# Patient Record
Sex: Male | Born: 1955 | Race: Black or African American | Hispanic: No | Marital: Single | Smoking: Current every day smoker
Health system: Southern US, Community
[De-identification: ages and names within clinical notes are randomized; demographics above are authoritative.]

## PROBLEM LIST (undated history)

## (undated) DIAGNOSIS — I10 Essential (primary) hypertension: Secondary | ICD-10-CM

## (undated) DIAGNOSIS — T1490XA Injury, unspecified, initial encounter: Secondary | ICD-10-CM

## (undated) DIAGNOSIS — M549 Dorsalgia, unspecified: Secondary | ICD-10-CM

## (undated) DIAGNOSIS — K219 Gastro-esophageal reflux disease without esophagitis: Secondary | ICD-10-CM

## (undated) DIAGNOSIS — C159 Malignant neoplasm of esophagus, unspecified: Secondary | ICD-10-CM

## (undated) DIAGNOSIS — G5600 Carpal tunnel syndrome, unspecified upper limb: Secondary | ICD-10-CM

## (undated) HISTORY — DX: Injury, unspecified, initial encounter: T14.90XA

## (undated) HISTORY — PX: WRIST SURGERY: SHX841

---

## 1981-04-12 HISTORY — PX: BACK SURGERY: SHX140

## 1998-09-25 ENCOUNTER — Ambulatory Visit (HOSPITAL_COMMUNITY): Admission: RE | Admit: 1998-09-25 | Discharge: 1998-09-25 | Payer: Self-pay | Admitting: *Deleted

## 1998-11-24 ENCOUNTER — Emergency Department (HOSPITAL_COMMUNITY): Admission: EM | Admit: 1998-11-24 | Discharge: 1998-11-24 | Payer: Self-pay | Admitting: Emergency Medicine

## 1998-12-02 ENCOUNTER — Emergency Department (HOSPITAL_COMMUNITY): Admission: EM | Admit: 1998-12-02 | Discharge: 1998-12-02 | Payer: Self-pay | Admitting: Emergency Medicine

## 1999-06-02 ENCOUNTER — Emergency Department (HOSPITAL_COMMUNITY): Admission: EM | Admit: 1999-06-02 | Discharge: 1999-06-02 | Payer: Self-pay | Admitting: Emergency Medicine

## 2000-11-21 ENCOUNTER — Encounter: Payer: Self-pay | Admitting: Family Medicine

## 2000-11-21 ENCOUNTER — Ambulatory Visit (HOSPITAL_COMMUNITY): Admission: RE | Admit: 2000-11-21 | Discharge: 2000-11-21 | Payer: Self-pay | Admitting: Family Medicine

## 2004-08-22 ENCOUNTER — Emergency Department (HOSPITAL_COMMUNITY): Admission: EM | Admit: 2004-08-22 | Discharge: 2004-08-22 | Payer: Self-pay | Admitting: Emergency Medicine

## 2004-12-03 ENCOUNTER — Ambulatory Visit: Payer: Self-pay | Admitting: Family Medicine

## 2004-12-03 ENCOUNTER — Ambulatory Visit: Payer: Self-pay | Admitting: *Deleted

## 2004-12-14 ENCOUNTER — Ambulatory Visit (HOSPITAL_COMMUNITY): Admission: RE | Admit: 2004-12-14 | Discharge: 2004-12-14 | Payer: Self-pay | Admitting: Family Medicine

## 2004-12-25 ENCOUNTER — Ambulatory Visit: Payer: Self-pay | Admitting: Family Medicine

## 2006-03-08 ENCOUNTER — Ambulatory Visit: Payer: Self-pay | Admitting: Family Medicine

## 2006-04-01 ENCOUNTER — Ambulatory Visit (HOSPITAL_COMMUNITY): Admission: RE | Admit: 2006-04-01 | Discharge: 2006-04-01 | Payer: Self-pay | Admitting: Family Medicine

## 2006-04-11 ENCOUNTER — Ambulatory Visit: Payer: Self-pay | Admitting: Family Medicine

## 2006-12-28 ENCOUNTER — Encounter (INDEPENDENT_AMBULATORY_CARE_PROVIDER_SITE_OTHER): Payer: Self-pay | Admitting: *Deleted

## 2007-01-19 ENCOUNTER — Ambulatory Visit: Payer: Self-pay | Admitting: Internal Medicine

## 2007-06-22 ENCOUNTER — Ambulatory Visit: Payer: Self-pay | Admitting: Internal Medicine

## 2007-06-26 ENCOUNTER — Emergency Department (HOSPITAL_COMMUNITY): Admission: EM | Admit: 2007-06-26 | Discharge: 2007-06-26 | Payer: Self-pay | Admitting: Emergency Medicine

## 2007-06-27 ENCOUNTER — Ambulatory Visit: Payer: Self-pay | Admitting: Internal Medicine

## 2007-07-20 ENCOUNTER — Ambulatory Visit: Payer: Self-pay | Admitting: Internal Medicine

## 2008-05-21 ENCOUNTER — Ambulatory Visit: Payer: Self-pay | Admitting: Family Medicine

## 2009-08-16 ENCOUNTER — Emergency Department (HOSPITAL_COMMUNITY): Admission: EM | Admit: 2009-08-16 | Discharge: 2009-08-16 | Payer: Self-pay | Admitting: Emergency Medicine

## 2009-11-26 ENCOUNTER — Encounter (INDEPENDENT_AMBULATORY_CARE_PROVIDER_SITE_OTHER): Payer: Self-pay | Admitting: Family Medicine

## 2009-11-26 ENCOUNTER — Ambulatory Visit: Payer: Self-pay | Admitting: Family Medicine

## 2009-11-26 LAB — CONVERTED CEMR LAB
ALT: 15 units/L (ref 0–53)
AST: 22 units/L (ref 0–37)
Alkaline Phosphatase: 67 units/L (ref 39–117)
Basophils Absolute: 0 10*3/uL (ref 0.0–0.1)
CO2: 30 meq/L (ref 19–32)
Direct LDL: 112 mg/dL — ABNORMAL HIGH
Eosinophils Relative: 2 % (ref 0–5)
HCT: 47.1 % (ref 39.0–52.0)
Hemoglobin: 15.3 g/dL (ref 13.0–17.0)
Lymphocytes Relative: 30 % (ref 12–46)
Lymphs Abs: 1.7 10*3/uL (ref 0.7–4.0)
MCHC: 32.5 g/dL (ref 30.0–36.0)
MCV: 95.9 fL (ref 78.0–100.0)
Monocytes Relative: 7 % (ref 3–12)
Neutro Abs: 3.4 10*3/uL (ref 1.7–7.7)
Neutrophils Relative %: 60 % (ref 43–77)
RBC: 4.91 M/uL (ref 4.22–5.81)
Total Bilirubin: 0.5 mg/dL (ref 0.3–1.2)
Total Protein: 6.4 g/dL (ref 6.0–8.3)

## 2009-12-03 ENCOUNTER — Ambulatory Visit: Payer: Self-pay | Admitting: Cardiology

## 2009-12-03 ENCOUNTER — Ambulatory Visit (HOSPITAL_COMMUNITY): Admission: RE | Admit: 2009-12-03 | Discharge: 2009-12-03 | Payer: Self-pay | Admitting: Family Medicine

## 2009-12-03 ENCOUNTER — Encounter (INDEPENDENT_AMBULATORY_CARE_PROVIDER_SITE_OTHER): Payer: Self-pay | Admitting: Family Medicine

## 2009-12-09 ENCOUNTER — Encounter: Admission: RE | Admit: 2009-12-09 | Discharge: 2010-01-21 | Payer: Self-pay | Admitting: Family Medicine

## 2010-01-01 ENCOUNTER — Encounter (INDEPENDENT_AMBULATORY_CARE_PROVIDER_SITE_OTHER): Payer: Self-pay | Admitting: Family Medicine

## 2010-01-01 LAB — CONVERTED CEMR LAB: Microalb, Ur: 0.5 mg/dL (ref 0.00–1.89)

## 2010-02-19 ENCOUNTER — Emergency Department (HOSPITAL_COMMUNITY)
Admission: EM | Admit: 2010-02-19 | Discharge: 2010-02-19 | Payer: Self-pay | Source: Home / Self Care | Admitting: Emergency Medicine

## 2010-04-16 ENCOUNTER — Encounter (INDEPENDENT_AMBULATORY_CARE_PROVIDER_SITE_OTHER): Payer: Self-pay | Admitting: Family Medicine

## 2010-04-16 LAB — CONVERTED CEMR LAB
Calcium: 9.6 mg/dL (ref 8.4–10.5)
Chloride: 95 meq/L — ABNORMAL LOW (ref 96–112)
Creatinine, Ser: 1.11 mg/dL (ref 0.40–1.50)
Free T4: 1.53 ng/dL (ref 0.80–1.80)
Glucose, Bld: 114 mg/dL — ABNORMAL HIGH (ref 70–99)
Potassium: 4.4 meq/L (ref 3.5–5.3)

## 2010-06-23 LAB — URINALYSIS, ROUTINE W REFLEX MICROSCOPIC
Ketones, ur: NEGATIVE mg/dL
Urobilinogen, UA: 0.2 mg/dL (ref 0.0–1.0)
pH: 7 (ref 5.0–8.0)

## 2010-06-23 LAB — CBC
HCT: 41.9 % (ref 39.0–52.0)
MCHC: 34.1 g/dL (ref 30.0–36.0)
MCV: 90.7 fL (ref 78.0–100.0)
Platelets: 264 10*3/uL (ref 150–400)
RDW: 12.6 % (ref 11.5–15.5)

## 2010-06-23 LAB — DIFFERENTIAL
Basophils Absolute: 0 10*3/uL (ref 0.0–0.1)
Eosinophils Absolute: 0.1 10*3/uL (ref 0.0–0.7)
Eosinophils Relative: 1 % (ref 0–5)
Lymphocytes Relative: 12 % (ref 12–46)
Lymphs Abs: 1.2 10*3/uL (ref 0.7–4.0)
Neutro Abs: 8.3 10*3/uL — ABNORMAL HIGH (ref 1.7–7.7)
Neutrophils Relative %: 81 % — ABNORMAL HIGH (ref 43–77)

## 2010-06-23 LAB — URINE CULTURE: Culture  Setup Time: 201111102220

## 2010-06-23 LAB — COMPREHENSIVE METABOLIC PANEL
AST: 20 U/L (ref 0–37)
Albumin: 4 g/dL (ref 3.5–5.2)
CO2: 30 mEq/L (ref 19–32)
Creatinine, Ser: 1 mg/dL (ref 0.4–1.5)
GFR calc non Af Amer: >60 mL/min (ref >60)

## 2010-06-23 LAB — URINE MICROSCOPIC-ADD ON

## 2010-06-30 LAB — POCT I-STAT, CHEM 8
BUN: 8 mg/dL (ref 6–23)
Creatinine, Ser: 0.8 mg/dL (ref 0.4–1.5)
Glucose, Bld: 99 mg/dL (ref 70–99)
Hemoglobin: 16.3 g/dL (ref 13.0–17.0)
TCO2: 29 mmol/L (ref 0–100)

## 2010-06-30 LAB — LIPASE, BLOOD: Lipase: 33 U/L (ref 11–59)

## 2010-10-03 ENCOUNTER — Emergency Department (HOSPITAL_COMMUNITY): Payer: Self-pay

## 2010-10-03 ENCOUNTER — Emergency Department (HOSPITAL_COMMUNITY)
Admission: EM | Admit: 2010-10-03 | Discharge: 2010-10-03 | Disposition: A | Discharge status: Home / Self Care | Payer: Self-pay | Attending: Emergency Medicine | Admitting: Emergency Medicine

## 2010-10-03 DIAGNOSIS — S86819A Strain of other muscle(s) and tendon(s) at lower leg level, unspecified leg, initial encounter: Secondary | ICD-10-CM | POA: Insufficient documentation

## 2010-10-03 DIAGNOSIS — M25569 Pain in unspecified knee: Secondary | ICD-10-CM | POA: Insufficient documentation

## 2010-10-03 DIAGNOSIS — X500XXA Overexertion from strenuous movement or load, initial encounter: Secondary | ICD-10-CM | POA: Insufficient documentation

## 2010-10-03 DIAGNOSIS — I10 Essential (primary) hypertension: Secondary | ICD-10-CM | POA: Insufficient documentation

## 2010-10-03 DIAGNOSIS — S838X9A Sprain of other specified parts of unspecified knee, initial encounter: Secondary | ICD-10-CM | POA: Insufficient documentation

## 2010-12-15 ENCOUNTER — Other Ambulatory Visit (HOSPITAL_COMMUNITY): Payer: Self-pay | Admitting: Family Medicine

## 2010-12-15 DIAGNOSIS — M25562 Pain in left knee: Secondary | ICD-10-CM

## 2010-12-15 DIAGNOSIS — R531 Weakness: Secondary | ICD-10-CM

## 2010-12-15 DIAGNOSIS — R609 Edema, unspecified: Secondary | ICD-10-CM

## 2010-12-18 ENCOUNTER — Ambulatory Visit (HOSPITAL_COMMUNITY)
Admission: RE | Admit: 2010-12-18 | Discharge: 2010-12-18 | Disposition: A | Discharge status: Home / Self Care | Payer: Self-pay | Source: Ambulatory Visit | Attending: Family Medicine | Admitting: Family Medicine

## 2010-12-18 DIAGNOSIS — M76899 Other specified enthesopathies of unspecified lower limb, excluding foot: Secondary | ICD-10-CM | POA: Insufficient documentation

## 2010-12-18 DIAGNOSIS — R609 Edema, unspecified: Secondary | ICD-10-CM | POA: Insufficient documentation

## 2010-12-18 DIAGNOSIS — R531 Weakness: Secondary | ICD-10-CM

## 2010-12-18 DIAGNOSIS — M25569 Pain in unspecified knee: Secondary | ICD-10-CM | POA: Insufficient documentation

## 2010-12-18 DIAGNOSIS — M25562 Pain in left knee: Secondary | ICD-10-CM

## 2010-12-18 DIAGNOSIS — X58XXXA Exposure to other specified factors, initial encounter: Secondary | ICD-10-CM | POA: Insufficient documentation

## 2010-12-18 DIAGNOSIS — IMO0002 Reserved for concepts with insufficient information to code with codable children: Secondary | ICD-10-CM | POA: Insufficient documentation

## 2011-08-26 DIAGNOSIS — M7710 Lateral epicondylitis, unspecified elbow: Secondary | ICD-10-CM | POA: Insufficient documentation

## 2011-08-26 DIAGNOSIS — IMO0002 Reserved for concepts with insufficient information to code with codable children: Secondary | ICD-10-CM | POA: Insufficient documentation

## 2012-01-18 ENCOUNTER — Emergency Department (HOSPITAL_COMMUNITY)
Admission: EM | Admit: 2012-01-18 | Discharge: 2012-01-18 | Disposition: A | Discharge status: Home / Self Care | Payer: Self-pay | Attending: Emergency Medicine | Admitting: Emergency Medicine

## 2012-01-18 ENCOUNTER — Encounter (HOSPITAL_COMMUNITY): Payer: Self-pay

## 2012-01-18 DIAGNOSIS — F17200 Nicotine dependence, unspecified, uncomplicated: Secondary | ICD-10-CM | POA: Insufficient documentation

## 2012-01-18 DIAGNOSIS — I10 Essential (primary) hypertension: Secondary | ICD-10-CM | POA: Insufficient documentation

## 2012-01-18 DIAGNOSIS — Z760 Encounter for issue of repeat prescription: Secondary | ICD-10-CM | POA: Insufficient documentation

## 2012-01-18 HISTORY — DX: Essential (primary) hypertension: I10

## 2012-01-18 MED ORDER — METOPROLOL SUCCINATE ER 25 MG PO TB24: 50.0000 mg | ORAL_TABLET | Freq: Every day | ORAL | Status: DC

## 2012-01-18 MED ORDER — LISINOPRIL-HYDROCHLOROTHIAZIDE 20-12.5 MG PO TABS: 1.0000 | ORAL_TABLET | Freq: Every day | ORAL | Status: DC

## 2012-01-18 MED ORDER — HYDROCODONE-ACETAMINOPHEN 5-325 MG PO TABS: 1.0000 | ORAL_TABLET | Freq: Four times a day (QID) | ORAL | Status: DC | PRN

## 2012-01-18 NOTE — ED Notes (Signed)
Pt was patient of [facility] and needs rx of htn meds refilled, also needs meds for wrist and back pain.

## 2012-01-18 NOTE — ED Provider Notes (Cosign Needed)
History  Scribed for Aaron Lennert, MD, the patient was seen in room TR02C/TR02C. This chart was scribed by Candelaria Stagers. The patient's care started at 3:06 PM   CSN: [id number]  Arrival date & time 01/18/12  1231   First MD Initiated Contact with Patient 01/18/12 1504      Chief Complaint  Patient presents with  . Medication Refill     Patient is a 56 y.o. male presenting with hypertension. The history is provided by the patient. No language interpreter was used.  Hypertension This is a chronic problem. The current episode started more than 1 week ago. The problem occurs constantly. The problem has not changed since onset.Pertinent negatives include no chest pain, no abdominal pain and no headaches. Relieved by: HTN medication. Treatments tried: pt is currently out of prescribed medication.   Aaron Wang is a 56 y.o. male who presents to the Emergency Department complaining of need for medication refill including HTN medications and Vicodin.  He is a former pt of [facility].   Past Medical History  Diagnosis Date  . Hypertension     Past Surgical History  Procedure Date  . Back surgery   . Wrist surgery     No family history on file.  History  Substance Use Topics  . Smoking status: Light Tobacco Smoker  . Smokeless tobacco: Not on file  . Alcohol Use: No      Review of Systems  Constitutional: Negative for fatigue.  HENT: Negative for congestion, sinus pressure and ear discharge.   Eyes: Negative for discharge.  Respiratory: Negative for cough.   Cardiovascular: Negative for chest pain.  Gastrointestinal: Negative for abdominal pain and diarrhea.  Genitourinary: Negative for frequency and hematuria.  Musculoskeletal: Negative for back pain.  Skin: Negative for rash.  Neurological: Negative for seizures and headaches.  Hematological: Negative.   Psychiatric/Behavioral: Negative for hallucinations.    Allergies  Review of patient's allergies indicates  no known allergies.  Home Medications   Current Outpatient Rx  Name Route Sig Dispense Refill  . DULOXETINE HCL 60 MG PO CPEP Oral Take 60 mg by mouth daily.    Marland Kitchen LISINOPRIL-HYDROCHLOROTHIAZIDE 20-12.5 MG PO TABS Oral Take 2 tablets by mouth daily.    Marland Kitchen METOPROLOL SUCCINATE ER 50 MG PO TB24 Oral Take 50 mg by mouth daily. Take with or immediately following a meal.      BP 151/86  Pulse 95  Temp 98.6 F (37 C) (Oral)  Resp 16  SpO2 97%  Physical Exam  Constitutional: He is oriented to person, place, and time. He appears well-developed.  HENT:  Head: Normocephalic and atraumatic.  Eyes: Conjunctivae normal and EOM are normal. No scleral icterus.  Neck: Neck supple. No thyromegaly present.  Cardiovascular: Normal rate and regular rhythm.  Exam reveals no gallop and no friction rub.   No murmur heard. Pulmonary/Chest: No stridor. He has no wheezes. He has no rales. He exhibits no tenderness.  Abdominal: He exhibits no distension. There is no tenderness. There is no rebound.  Musculoskeletal: Normal range of motion. He exhibits no edema.  Lymphadenopathy:    He has no cervical adenopathy.  Neurological: He is oriented to person, place, and time. Coordination normal.  Skin: No rash noted. No erythema.  Psychiatric: He has a normal mood and affect. His behavior is normal.    ED Course  Procedures   DIAGNOSTIC STUDIES: Oxygen Saturation is 97% on room air, normal by my interpretation.    COORDINATION OF  CARE:     Labs Reviewed - No data to display No results found.   No diagnosis found.    MDM     The chart was scribed for me under my direct supervision.  I personally performed the history, physical, and medical decision making and all procedures in the evaluation of this patient.Aaron Lennert, MD 01/18/12 [phone]

## 2012-04-13 ENCOUNTER — Encounter (HOSPITAL_COMMUNITY): Payer: Self-pay | Admitting: *Deleted

## 2012-04-13 ENCOUNTER — Emergency Department (HOSPITAL_COMMUNITY)
Admission: EM | Admit: 2012-04-13 | Discharge: 2012-04-13 | Disposition: A | Discharge status: Home / Self Care | Payer: Self-pay | Attending: Emergency Medicine | Admitting: Emergency Medicine

## 2012-04-13 DIAGNOSIS — I10 Essential (primary) hypertension: Secondary | ICD-10-CM | POA: Insufficient documentation

## 2012-04-13 DIAGNOSIS — Z760 Encounter for issue of repeat prescription: Secondary | ICD-10-CM | POA: Insufficient documentation

## 2012-04-13 DIAGNOSIS — F17200 Nicotine dependence, unspecified, uncomplicated: Secondary | ICD-10-CM | POA: Insufficient documentation

## 2012-04-13 DIAGNOSIS — Z79899 Other long term (current) drug therapy: Secondary | ICD-10-CM | POA: Insufficient documentation

## 2012-04-13 MED ORDER — METOPROLOL SUCCINATE ER 25 MG PO TB24: 25.0000 mg | ORAL_TABLET | Freq: Every day | ORAL | Status: DC

## 2012-04-13 MED ORDER — LISINOPRIL-HYDROCHLOROTHIAZIDE 20-12.5 MG PO TABS: 1.0000 | ORAL_TABLET | Freq: Every day | ORAL | Status: DC

## 2012-04-13 NOTE — ED Provider Notes (Signed)
History     CSN: [id number]  Arrival date & time 04/13/12  4098   First MD Initiated Contact with Patient 04/13/12 [phone]      Chief Complaint  Patient presents with  . Medication Refill    (Consider location/radiation/quality/duration/timing/severity/associated sxs/prior treatment) HPI  57 year old male with history of hypertension presents requesting for medication refill. Patient reports he has been out of his blood pressure medication for a month. He was last added refill in the ER. He mentioned that he is trying to find a primary care Dr. and is in the process. He denies any acute changes, specifically no headache, vision changes, chest pain or shortness of breath. He does complain of generalized aches and pains his back, shoulders and arms which has been chronic in nature.   Past Medical History  Diagnosis Date  . Hypertension     Past Surgical History  Procedure Date  . Back surgery   . Wrist surgery     No family history on file.  History  Substance Use Topics  . Smoking status: Light Tobacco Smoker  . Smokeless tobacco: Not on file  . Alcohol Use: No      Review of Systems  Constitutional: Negative for fever.       A complete 10 system review of systems was obtained and all systems are negative except as noted in the HPI and PMH.  HENT: Negative for neck pain.   Respiratory: Negative for shortness of breath.   Cardiovascular: Negative for chest pain.  Gastrointestinal: Negative for abdominal pain.  Genitourinary: Negative for hematuria.  Musculoskeletal: Positive for back pain.  Neurological: Negative for numbness.    Allergies  Review of patient's allergies indicates no known allergies.  Home Medications   Current Outpatient Rx  Name  Route  Sig  Dispense  Refill  . DULOXETINE HCL 60 MG PO CPEP   Oral   Take 60 mg by mouth daily.         Marland Kitchen HYDROCODONE-ACETAMINOPHEN 5-325 MG PO TABS   Oral   Take 1 tablet by mouth every 6 (six) hours as needed  for pain.   30 tablet   1   . LISINOPRIL-HYDROCHLOROTHIAZIDE 20-12.5 MG PO TABS   Oral   Take 2 tablets by mouth daily.         Marland Kitchen LISINOPRIL-HYDROCHLOROTHIAZIDE 20-12.5 MG PO TABS   Oral   Take 1 tablet by mouth daily.   30 tablet   1   . METOPROLOL SUCCINATE ER 25 MG PO TB24   Oral   Take 2 tablets (50 mg total) by mouth daily.   30 tablet   1   . METOPROLOL SUCCINATE ER 50 MG PO TB24   Oral   Take 50 mg by mouth daily. Take with or immediately following a meal.           BP 177/93  Temp 97.5 F (36.4 C) (Oral)  Resp 16  SpO2 99%  Physical Exam  Nursing note and vitals reviewed. Constitutional: He appears well-developed and well-nourished. No distress.       Awake, alert, nontoxic appearance  HENT:  Head: Atraumatic.  Eyes: Conjunctivae normal are normal. Right eye exhibits no discharge. Left eye exhibits no discharge.  Neck: Normal range of motion. Neck supple.  Cardiovascular: Normal rate and regular rhythm.   Pulmonary/Chest: Effort normal. No respiratory distress. He exhibits no tenderness.  Abdominal: Soft. There is no tenderness. There is no rebound.  Musculoskeletal: He exhibits no edema and  no tenderness.       ROM appears intact, no obvious focal weakness  Neurological: He is alert.  Skin: Skin is warm and dry. No rash noted.  Psychiatric: He has a normal mood and affect.    ED Course  Procedures (including critical care time)  Labs Reviewed - No data to display No results found.   No diagnosis found.  1. Encounter for medication refill.   MDM  Pt is here requesting for medication refill for his HTN.  Pt takes lisinopril-HCTZ, and motoprolol.  Will give prescription refill.  Pt encourage to continue with finding PCP.  To return if he is out of meds.  Pt otherwise in NAD.  Examination unremarkable.    BP 177/93  Temp 97.5 F (36.4 C) (Oral)  Resp 16  SpO2 99%       Fayrene Helper, PA-C 04/13/12 1610

## 2012-04-13 NOTE — ED Provider Notes (Signed)
Medical screening examination/treatment/procedure(s) were performed by non-physician practitioner and as supervising physician I was immediately available for consultation/collaboration.    Nelia Shi, MD 04/13/12 [phone]

## 2012-04-13 NOTE — ED Notes (Signed)
Pt reports out of BP medications x 1 month. Given Lisinopril-HCTZ and Metoprolol by Dr. Estell Harpin in October. Trying to get a PCP.

## 2012-04-19 ENCOUNTER — Ambulatory Visit: Payer: Self-pay | Admitting: Family Medicine

## 2013-02-09 ENCOUNTER — Ambulatory Visit (INDEPENDENT_AMBULATORY_CARE_PROVIDER_SITE_OTHER): Payer: No Typology Code available for payment source | Admitting: Family Medicine

## 2013-02-09 ENCOUNTER — Encounter: Payer: Self-pay | Admitting: Family Medicine

## 2013-02-09 VITALS — BP 150/83 | Ht 67.5 in | Wt 144.0 lb

## 2013-02-09 DIAGNOSIS — M25569 Pain in unspecified knee: Secondary | ICD-10-CM

## 2013-02-09 DIAGNOSIS — M549 Dorsalgia, unspecified: Secondary | ICD-10-CM | POA: Insufficient documentation

## 2013-02-09 DIAGNOSIS — G5601 Carpal tunnel syndrome, right upper limb: Secondary | ICD-10-CM

## 2013-02-09 DIAGNOSIS — M25562 Pain in left knee: Secondary | ICD-10-CM

## 2013-02-09 DIAGNOSIS — G5600 Carpal tunnel syndrome, unspecified upper limb: Secondary | ICD-10-CM | POA: Insufficient documentation

## 2013-02-09 MED ORDER — MELOXICAM 15 MG PO TABS: 15.0000 mg | ORAL_TABLET | Freq: Every day | ORAL | Status: DC

## 2013-02-09 NOTE — Assessment & Plan Note (Signed)
Mobic prescription given today.

## 2013-02-09 NOTE — Assessment & Plan Note (Signed)
Encouraged patient to wear night splint

## 2013-02-09 NOTE — Patient Instructions (Addendum)
Take the Mobic once a day for pain relief.    If you would like to consider an injection in your knee feel free to return, if the pain medication is not working.  Follow up in 4-6 weeks.  You should wear your night splint for the carpal tunnel syndrome in your wrist.

## 2013-02-09 NOTE — Assessment & Plan Note (Signed)
Patient offered injection today however he declined. Started on Mobic. Compression knee brace given today.

## 2013-02-09 NOTE — Progress Notes (Signed)
Patient ID: Aaron Wang, male   DOB: [birth year], 57 y.o.   MRN: [id number] 57 year old male with 3 complaints.  #1 left knee pain. Onset over the past several years. History of a twisting injury to his knee with an MRI 2 years ago revealing posterior medial meniscal tear. Reports pain at night as well as when walking. Occasional catching or locking of the knee. Pain worse with stooping squatting on stairs. No recent injury. Knee occasionally swells. Has seen an orthopedist who recommended surgery however, he has not elected to do that at this time.  #2 low back pain. 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 No radiation of symptoms no weakness or numbness not currently taking any medications. Does not recall a specific inciting event describes worsening low back pain. This has been a chronic issue plaguing him for greater than 7-8 years. Gradually worsening. Pain when standing up or heavy lifting.  #3 right carpal tunnel syndrome. Patient has had progressively worsening symptoms of right carpal tunnel syndrome. Seen in orthopedist in the past but not had surgery. He wears night splints occasionally. Not currently working with his hands. No new injury. Denies dropping objects at this time.  Past Medical History  Diagnosis Date  . Hypertension    Past Surgical History  Procedure Laterality Date  . Back surgery    . Wrist surgery     No Known Allergies  Review of systems as per history of present illness otherwise negative  Examination: BP 150/83  Ht 5' 7.5" (1.715 m)  Wt 144 lb (65.318 kg)  BMI 22.21 kg/m2 Well-developed well-nourished 57 year old male awake alert and oriented in no acute distress  Low back: No tenderness to palpation. Flexion and extension intact. Negative straight leg raise. Or vascular intact distally.  Right wrist: Positive Tinel's and Phalen's sign. No evidence of thenar atrophy at this time. Strength 5/5. Pulses intact.  Left Knee: Normal to  inspection with no erythema or effusion or obvious bony abnormalities. Palpation with medial joint line tenderness ROM full in flexion and extension and lower leg rotation. Ligaments with solid consistent endpoints including ACL, PCL, LCL, MCL. Positive Mcmurray's tests medial side to pain. Non painful patellar compression. Patellar glide without crepitus. Patellar and quadriceps tendons unremarkable. Hamstring and quadriceps strength is normal.

## 2013-02-09 NOTE — Addendum Note (Signed)
Addended by: Annita Brod on: 02/09/2013 02:49 PM   Modules accepted: Orders

## 2013-03-21 ENCOUNTER — Ambulatory Visit: Payer: No Typology Code available for payment source | Admitting: Emergency Medicine

## 2013-05-09 ENCOUNTER — Ambulatory Visit (INDEPENDENT_AMBULATORY_CARE_PROVIDER_SITE_OTHER): Payer: Self-pay | Admitting: Emergency Medicine

## 2013-05-09 ENCOUNTER — Encounter: Payer: Self-pay | Admitting: Emergency Medicine

## 2013-05-09 VITALS — BP 161/89

## 2013-05-09 DIAGNOSIS — M25569 Pain in unspecified knee: Secondary | ICD-10-CM

## 2013-05-09 MED ORDER — METHYLPREDNISOLONE ACETATE 80 MG/ML IJ SUSP
80.0000 mg | Freq: Once | INTRAMUSCULAR | Status: AC
Start: 2013-05-09 — End: 2013-05-09
  Administered 2013-05-09: 80 mg via INTRA_ARTICULAR

## 2013-05-09 NOTE — Progress Notes (Signed)
Patient ID: Aaron Wang, male   DOB: October 26, 1955, 58 y.o.   MRN: [id number] 58 year old male with several years of left knee pain. He continues to be bothersome. Diagnosed with a posterior medial meniscal tear on MRI 2 years ago. Has been wearing a splint with moderate relief of symptoms. Was seen the sports medicine clinic 3 months ago at that time declined injection therapy. Presents today with continued symptoms requesting injection therapy.  Review of systems as per history of present illness otherwise negative  Pertinent past medical history hypertension and left posterior medial meniscal tear.  Social history:  Late tobacco smoker no alcohol  Examination Well-developed well-nourished 58 year old African American male awake alert and oriented no acute distress Left Knee: Normal to inspection with no erythema or effusion or obvious bony abnormalities. Palpation with joint line tenderness medially, no patellar tenderness, or condyle tenderness. ROM full in flexion and extension and lower leg rotation. Ligaments with solid consistent endpoints including ACL, PCL, LCL, MCL. Negative Mcmurray's Non painful patellar compression. Patellar glide without crepitus. Patellar and quadriceps tendons unremarkable. Hamstring and quadriceps strength is normal.   Neurovascularly intact bilateral lower extremities  Procedure:  Injection of left knee Consent obtained and verified. Time-out conducted. Noted no overlying erythema, induration, or other signs of local infection. Skin prepped in a sterile fashion. Topical analgesic spray: Ethyl chloride. Completed without difficulty. Meds: 4cc 1% lido with 1 cc 80mg /ml depomedrol Pain immediately improved suggesting accurate placement of the medication. Advised to call if fevers/chills, erythema, induration, drainage, or persistent bleeding.

## 2013-08-30 ENCOUNTER — Encounter: Payer: Self-pay | Admitting: Gastroenterology

## 2013-10-10 ENCOUNTER — Encounter: Payer: Self-pay | Admitting: Gastroenterology

## 2013-10-15 ENCOUNTER — Telehealth: Payer: Self-pay | Admitting: Gastroenterology

## 2013-10-15 NOTE — Telephone Encounter (Signed)
Received 1 page from [facility], sent to Dr. Ardis Hughs. 10/15/13/ss.

## 2013-10-15 NOTE — Telephone Encounter (Signed)
Received 2 pages from [facility], sent to Dr. Ardis Hughs. 10/15/13/ss

## 2013-11-22 ENCOUNTER — Encounter (HOSPITAL_COMMUNITY): Payer: Self-pay | Admitting: Emergency Medicine

## 2013-11-22 ENCOUNTER — Emergency Department (HOSPITAL_COMMUNITY)
Admission: EM | Admit: 2013-11-22 | Discharge: 2013-11-22 | Disposition: A | Discharge status: Home / Self Care | Payer: Medicaid Other | Attending: Emergency Medicine | Admitting: Emergency Medicine

## 2013-11-22 ENCOUNTER — Emergency Department (HOSPITAL_COMMUNITY): Payer: Medicaid Other

## 2013-11-22 DIAGNOSIS — Y9389 Activity, other specified: Secondary | ICD-10-CM | POA: Diagnosis not present

## 2013-11-22 DIAGNOSIS — Z79899 Other long term (current) drug therapy: Secondary | ICD-10-CM | POA: Insufficient documentation

## 2013-11-22 DIAGNOSIS — Y92009 Unspecified place in unspecified non-institutional (private) residence as the place of occurrence of the external cause: Secondary | ICD-10-CM | POA: Diagnosis not present

## 2013-11-22 DIAGNOSIS — I10 Essential (primary) hypertension: Secondary | ICD-10-CM | POA: Insufficient documentation

## 2013-11-22 DIAGNOSIS — Z791 Long term (current) use of non-steroidal anti-inflammatories (NSAID): Secondary | ICD-10-CM | POA: Insufficient documentation

## 2013-11-22 DIAGNOSIS — F17200 Nicotine dependence, unspecified, uncomplicated: Secondary | ICD-10-CM | POA: Diagnosis not present

## 2013-11-22 DIAGNOSIS — S8990XA Unspecified injury of unspecified lower leg, initial encounter: Secondary | ICD-10-CM | POA: Insufficient documentation

## 2013-11-22 DIAGNOSIS — S99929A Unspecified injury of unspecified foot, initial encounter: Principal | ICD-10-CM

## 2013-11-22 DIAGNOSIS — S99919A Unspecified injury of unspecified ankle, initial encounter: Principal | ICD-10-CM

## 2013-11-22 DIAGNOSIS — W010XXA Fall on same level from slipping, tripping and stumbling without subsequent striking against object, initial encounter: Secondary | ICD-10-CM | POA: Diagnosis not present

## 2013-11-22 DIAGNOSIS — M25561 Pain in right knee: Secondary | ICD-10-CM

## 2013-11-22 MED ORDER — OXYCODONE-ACETAMINOPHEN 5-325 MG PO TABS: 1.0000 | ORAL_TABLET | ORAL | Status: DC | PRN

## 2013-11-22 NOTE — ED Notes (Signed)
Patient states he tripped over something and "think I might have hit to the side of my knee".   Patient complains of R knee pain.

## 2013-11-22 NOTE — ED Provider Notes (Signed)
CSN: [id number]     Arrival date & time 11/22/13  6945 History   First MD Initiated Contact with Patient 11/22/13 [phone]     Chief Complaint  Patient presents with  . Knee Pain     (Consider location/radiation/quality/duration/timing/severity/associated sxs/prior Treatment) Patient is a 58 y.o. male presenting with knee pain. The history is provided by the patient and medical records.  Knee Pain  This is a 58 year old male with past medical history significant for hypertension,  Presenting to the ED for right knee injury. Patient st when he tripped and fell on the concrete in front of his home.  States when he feel his knee twisted.  He denies head injury or LOC.  States since fall he has been having persistent right knee worse, worse along medial joint line.  States he is able to ambulate, but is doing so with a limp.  Denies numbness, paresthesias, or weakness of right leg.  Patient does have hx of ligament tears in his left knee, denies prior right knee injuries.  Patient is established at sports medicine clinic.  Past Medical History  Diagnosis Date  . Hypertension    Past Surgical History  Procedure Laterality Date  . Back surgery    . Wrist surgery     No family history on file. History  Substance Use Topics  . Smoking status: Light Tobacco Smoker  . Smokeless tobacco: Not on file  . Alcohol Use: No    Review of Systems  Musculoskeletal: Positive for arthralgias.  All other systems reviewed and are negative.     Allergies  Review of patient's allergies indicates no known allergies.  Home Medications   Prior to Admission medications   Medication Sig Start Date End Date Taking? Authorizing Provider  DULoxetine (CYMBALTA) 60 MG capsule Take 60 mg by mouth daily.    Historical Provider, MD  HYDROcodone-acetaminophen (NORCO/VICODIN) 5-325 MG per tablet Take 1 tablet by mouth every 6 (six) hours as needed for pain. 01/18/12   Maudry Diego, MD   lisinopril-hydrochlorothiazide (PRINZIDE) 20-12.5 MG per tablet Take 1 tablet by mouth daily. 04/13/12   Domenic Moras, PA-C  meloxicam (MOBIC) 15 MG tablet Take 1 tablet (15 mg total) by mouth daily. 02/09/13   Hennie Duos, MD  metoprolol succinate (TOPROL-XL) 25 MG 24 hr tablet Take 1 tablet (25 mg total) by mouth daily. 04/13/12   Domenic Moras, PA-C   BP 166/79  Pulse 78  Temp(Src) 97.9 F (36.6 C) (Oral)  Resp 18  Ht 5\' 7"  (1.702 m)  Wt 143 lb (64.864 kg)  BMI 22.39 kg/m2  SpO2 99%  Physical Exam  Nursing note and vitals reviewed. Constitutional: He is oriented to person, place, and time. He appears well-developed and well-nourished. No distress.  HENT:  Head: Normocephalic and atraumatic.  Mouth/Throat: Oropharynx is clear and moist.  Eyes: Conjunctivae and EOM are normal. Pupils are equal, round, and reactive to light.  Neck: Normal range of motion. Neck supple.  Cardiovascular: Normal rate, regular rhythm and normal heart sounds.   Pulmonary/Chest: Effort normal and breath sounds normal. No respiratory distress. He has no wheezes.  Musculoskeletal: Normal range of motion.       Right knee: Tenderness found. Medial joint line tenderness noted.  Left knee in brace Right knee with tenderness along medial joint line; full flexion/extension maintained; some pain with varus stress; normal McMurray's test; no tendon laxity noted; leg NVI  Neurological: He is alert and oriented to person, place,  and time.  Skin: Skin is warm and dry. He is not diaphoretic.  Psychiatric: He has a normal mood and affect.    ED Course  Procedures (including critical care time) Labs Review Labs Reviewed - No data to display  Imaging Review Dg Knee Complete 4 Views Right  11/22/2013   CLINICAL DATA:  Fall, twisting knee injury  EXAM: RIGHT KNEE - COMPLETE 4+ VIEW  COMPARISON:  None.  FINDINGS: There is no evidence of fracture, dislocation, or joint effusion. There is no evidence of arthropathy or other  focal bone abnormality. Soft tissues are unremarkable.  IMPRESSION: No acute osseous finding   Electronically Signed   By: Daryll Brod M.D.   On: 11/22/2013 09:07     EKG Interpretation None      MDM   Final diagnoses:  Right knee pain   Imaging negative for acute findings.  Based on exam findings, suggest ligamentous strain-- low suspicion for complete ligamentous/tendon tear. Leg remains NVI.  Patient placed in knee sleeve, percocet for pain.  Encouraged RICE routine and activity modification.  Will FU with sports medicine clinic.  Discussed plan with patient, he/she acknowledged understanding and agreed with plan of care.  Return precautions given for new or worsening symptoms.  Larene Pickett, PA-C 11/22/13 1014

## 2013-11-22 NOTE — Discharge Instructions (Signed)
Take the prescribed medication as directed. Follow-up with your orthopedist. Return to the ED for new or worsening symptoms.

## 2013-11-23 NOTE — ED Provider Notes (Signed)
Medical screening examination/treatment/procedure(s) were performed by non-physician practitioner and as supervising physician I was immediately available for consultation/collaboration.  Ephraim Hamburger, MD 11/23/13 1520

## 2014-04-17 ENCOUNTER — Encounter: Payer: Self-pay | Admitting: Gastroenterology

## 2014-04-24 ENCOUNTER — Telehealth: Payer: Self-pay | Admitting: Gastroenterology

## 2014-04-24 NOTE — Telephone Encounter (Signed)
Rec'd records from [facility] 2pgs to Dr.Jacobs

## 2014-06-03 ENCOUNTER — Encounter: Payer: Self-pay | Admitting: Gastroenterology

## 2014-06-17 ENCOUNTER — Encounter: Payer: Medicaid Other | Admitting: Gastroenterology

## 2014-07-24 ENCOUNTER — Encounter: Payer: Self-pay | Admitting: Gastroenterology

## 2014-08-06 ENCOUNTER — Encounter: Payer: Medicaid Other | Admitting: Gastroenterology

## 2014-09-18 ENCOUNTER — Ambulatory Visit (AMBULATORY_SURGERY_CENTER): Payer: Self-pay

## 2014-09-18 VITALS — Ht 67.0 in | Wt 129.6 lb

## 2014-09-18 DIAGNOSIS — Z1211 Encounter for screening for malignant neoplasm of colon: Secondary | ICD-10-CM

## 2014-09-18 MED ORDER — MOVIPREP 100 G PO SOLR: 1.0000 | Freq: Once | ORAL | Status: DC

## 2014-09-18 NOTE — Progress Notes (Signed)
No allergies to eggs or soy No diet/weight loss meds No home oxygen No past problems with anesthesia  No email no computer

## 2014-10-02 ENCOUNTER — Encounter: Payer: Medicaid Other | Admitting: Gastroenterology

## 2014-10-02 ENCOUNTER — Telehealth: Payer: Self-pay | Admitting: Gastroenterology

## 2014-10-02 NOTE — Telephone Encounter (Signed)
Should bill him the no show, late cancel fee unless he reschedules

## 2015-03-27 ENCOUNTER — Encounter (HOSPITAL_COMMUNITY): Payer: Self-pay | Admitting: Emergency Medicine

## 2015-03-27 ENCOUNTER — Emergency Department (HOSPITAL_COMMUNITY): Payer: Medicaid Other

## 2015-03-27 ENCOUNTER — Emergency Department (HOSPITAL_COMMUNITY)
Admission: EM | Admit: 2015-03-27 | Discharge: 2015-03-27 | Disposition: A | Discharge status: Home / Self Care | Payer: Medicaid Other | Attending: Emergency Medicine | Admitting: Emergency Medicine

## 2015-03-27 DIAGNOSIS — Y9389 Activity, other specified: Secondary | ICD-10-CM | POA: Diagnosis not present

## 2015-03-27 DIAGNOSIS — Y92410 Unspecified street and highway as the place of occurrence of the external cause: Secondary | ICD-10-CM | POA: Diagnosis not present

## 2015-03-27 DIAGNOSIS — Z79899 Other long term (current) drug therapy: Secondary | ICD-10-CM | POA: Diagnosis not present

## 2015-03-27 DIAGNOSIS — I10 Essential (primary) hypertension: Secondary | ICD-10-CM | POA: Insufficient documentation

## 2015-03-27 DIAGNOSIS — F17200 Nicotine dependence, unspecified, uncomplicated: Secondary | ICD-10-CM | POA: Insufficient documentation

## 2015-03-27 DIAGNOSIS — Y998 Other external cause status: Secondary | ICD-10-CM | POA: Insufficient documentation

## 2015-03-27 DIAGNOSIS — M25522 Pain in left elbow: Secondary | ICD-10-CM

## 2015-03-27 DIAGNOSIS — S59902A Unspecified injury of left elbow, initial encounter: Secondary | ICD-10-CM | POA: Diagnosis not present

## 2015-03-27 MED ORDER — IBUPROFEN 800 MG PO TABS: 800.0000 mg | ORAL_TABLET | Freq: Three times a day (TID) | ORAL | Status: AC

## 2015-03-27 MED ORDER — IBUPROFEN 400 MG PO TABS
800.0000 mg | ORAL_TABLET | Freq: Once | ORAL | Status: AC
Start: 2015-03-27 — End: 2015-03-27
  Administered 2015-03-27: 800 mg via ORAL
  Filled 2015-03-27: qty 2

## 2015-03-27 NOTE — ED Notes (Signed)
Ring and watch removed and ice pack applied to left elbow
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
## 2015-03-27 NOTE — ED Notes (Signed)
Pt able to ambulate independently 
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
## 2015-03-27 NOTE — Discharge Instructions (Signed)

## 2015-10-16 ENCOUNTER — Ambulatory Visit (INDEPENDENT_AMBULATORY_CARE_PROVIDER_SITE_OTHER): Payer: Medicaid Other | Admitting: Sports Medicine

## 2015-10-16 ENCOUNTER — Encounter: Payer: Self-pay | Admitting: Sports Medicine

## 2015-10-16 VITALS — BP 163/90 | HR 68 | Ht 66.0 in | Wt 134.0 lb

## 2015-10-16 DIAGNOSIS — M25562 Pain in left knee: Secondary | ICD-10-CM | POA: Diagnosis not present

## 2015-10-16 MED ORDER — METHYLPREDNISOLONE ACETATE 80 MG/ML IJ SUSP
80.0000 mg | Freq: Once | INTRAMUSCULAR | Status: AC
  Administered 2015-10-16: 80 mg via INTRA_ARTICULAR

## 2015-10-17 NOTE — Progress Notes (Signed)
   Subjective:    Patient ID: Aaron Wang, male    DOB: [birth year], 58 y.o.   MRN: [id number]  HPI chief complaint: Left knee pain  Very pleasant 60 year old male comes in today complaining of returning left knee pain. He has a well-documented history of a medial meniscal tear in this knee which happened after an injury in 2012. Surgery was recommended but the patient decided against this. Instead, he has been treated conservatively with cortisone injections which seem to help. Last injection was in 2015 and provided him with good symptom relief up until about 3 months ago. His pain has now returned. It is primarily along the medial knee. It is present mainly with activity but he does get some pain at rest as well. No mechanical symptoms. Pain is sharp in quality and does occasionally cause the knee to buckle. He would like to proceed with a repeat cortisone injection. He was also given a double upright brace during that 2015 visit and he would like a new brace as it does seem to help him.  Interim medical history is reviewed Current medications reviewed Allergies reviewed    Review of Systems    as above Objective:   Physical Exam  Well-developed, well-nourished. No acute distress. Vital signs reviewed  Left knee: Range of motion is 0-100. No obvious effusion. He is tender to palpation along the medial joint line with a positive McMurray's and a positive Thessaly's. No tenderness to palpation along the lateral joint line. Some mild laxity with anterior drawer testing. Posterior drawer is intact. Knee is stable to valgus and varus stressing. Neurovascularly intact distally. Walking with the assistance of a cane.  MRI scan from 2012 is reviewed. Patient has a tear of the posterior horn of the medial meniscus. Otherwise, he has only some mild degenerative changes throughout the knee. Previous MCL injury is also noted which has healed in the interim per my exam.      Assessment & Plan:    Returning left knee pain secondary to medial meniscal tear  Patient has had excellent success with cortisone injections in the past. Therefore, I've agreed to reinject his knee today. I will repeat the injection and the patient may increase activity as tolerated. We will fit him with a new double upright brace to wear with activity. If symptoms do not improve with today's repeat injection then we may need to reconsider surgical referral for meniscectomy. Follow-up for ongoing or recalcitrant issues.  Consent obtained and verified. Time-out conducted. Noted no overlying erythema, induration, or other signs of local infection. Skin prepped in a sterile fashion. Topical analgesic spray: Ethyl chloride. Joint: left knee Needle: 25g 1.5 inch Completed without difficulty. Meds: 3cc 1% xylocaine and 1cc (80mg ) depomedrol  Advised to call if fevers/chills, erythema, induration, drainage, or persistent bleeding.

## 2017-01-21 ENCOUNTER — Emergency Department (HOSPITAL_COMMUNITY)
Admission: EM | Admit: 2017-01-21 | Discharge: 2017-01-21 | Disposition: A | Discharge status: Home / Self Care | Payer: Medicaid Other | Attending: Emergency Medicine | Admitting: Emergency Medicine

## 2017-01-21 ENCOUNTER — Encounter (HOSPITAL_COMMUNITY): Payer: Self-pay | Admitting: Emergency Medicine

## 2017-01-21 DIAGNOSIS — H1131 Conjunctival hemorrhage, right eye: Secondary | ICD-10-CM | POA: Insufficient documentation

## 2017-01-21 DIAGNOSIS — Z79899 Other long term (current) drug therapy: Secondary | ICD-10-CM | POA: Diagnosis not present

## 2017-01-21 DIAGNOSIS — I10 Essential (primary) hypertension: Secondary | ICD-10-CM | POA: Diagnosis not present

## 2017-01-21 DIAGNOSIS — F17210 Nicotine dependence, cigarettes, uncomplicated: Secondary | ICD-10-CM | POA: Diagnosis not present

## 2017-01-21 DIAGNOSIS — Z7982 Long term (current) use of aspirin: Secondary | ICD-10-CM | POA: Diagnosis not present

## 2017-01-21 DIAGNOSIS — H5711 Ocular pain, right eye: Secondary | ICD-10-CM | POA: Diagnosis present

## 2017-01-21 MED ORDER — TETRACAINE HCL 0.5 % OP SOLN
2.0000 [drp] | Freq: Once | OPHTHALMIC | Status: DC
  Filled 2017-01-21: qty 4

## 2017-01-21 MED ORDER — FLUORESCEIN SODIUM 1 MG OP STRP
1.0000 | ORAL_STRIP | Freq: Once | OPHTHALMIC | Status: DC
Start: 2017-01-21 — End: 2017-01-21
  Filled 2017-01-21: qty 1

## 2017-01-21 NOTE — Discharge Instructions (Signed)
Please read instructions below. Follow-up with your primary care doctor or eye doctor if symptoms persist. Return to the ER immediately if you have sudden vision loss, ear pain in your right eye, or new or concerning symptoms.

## 2017-01-21 NOTE — ED Triage Notes (Signed)
Pt reports photosensitivity and pain in R eye, onset Tuesday.  Denies injury. Conjuctiva noted to be red.
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
## 2018-01-04 ENCOUNTER — Encounter (HOSPITAL_COMMUNITY): Payer: Self-pay | Admitting: Emergency Medicine

## 2018-01-04 ENCOUNTER — Emergency Department (HOSPITAL_COMMUNITY): Payer: Medicaid Other

## 2018-01-04 ENCOUNTER — Emergency Department (HOSPITAL_COMMUNITY)
Admission: EM | Admit: 2018-01-04 | Discharge: 2018-01-04 | Disposition: A | Discharge status: Home / Self Care | Payer: Medicaid Other | Attending: Emergency Medicine | Admitting: Emergency Medicine

## 2018-01-04 DIAGNOSIS — Z79899 Other long term (current) drug therapy: Secondary | ICD-10-CM | POA: Insufficient documentation

## 2018-01-04 DIAGNOSIS — F17210 Nicotine dependence, cigarettes, uncomplicated: Secondary | ICD-10-CM | POA: Diagnosis not present

## 2018-01-04 DIAGNOSIS — I10 Essential (primary) hypertension: Secondary | ICD-10-CM | POA: Insufficient documentation

## 2018-01-04 DIAGNOSIS — J029 Acute pharyngitis, unspecified: Secondary | ICD-10-CM | POA: Diagnosis present

## 2018-01-04 DIAGNOSIS — K219 Gastro-esophageal reflux disease without esophagitis: Secondary | ICD-10-CM

## 2018-01-04 LAB — CBC WITH DIFFERENTIAL/PLATELET
Abs Immature Granulocytes: 0 10*3/uL (ref 0.0–0.1)
BASOS ABS: 0 10*3/uL (ref 0.0–0.1)
BASOS PCT: 1 %
EOS ABS: 0.1 10*3/uL (ref 0.0–0.7)
Eosinophils Relative: 2 %
HEMATOCRIT: 44.2 % (ref 39.0–52.0)
Hemoglobin: 14.5 g/dL (ref 13.0–17.0)
IMMATURE GRANULOCYTES: 0 %
LYMPHS ABS: 1.1 10*3/uL (ref 0.7–4.0)
Lymphocytes Relative: 33 %
MCH: 31.5 pg (ref 26.0–34.0)
MCHC: 32.8 g/dL (ref 30.0–36.0)
MCV: 96.1 fL (ref 78.0–100.0)
Monocytes Absolute: 0.4 10*3/uL (ref 0.1–1.0)
Monocytes Relative: 13 %
NEUTROS PCT: 51 %
Neutro Abs: 1.8 10*3/uL (ref 1.7–7.7)
PLATELETS: 293 10*3/uL (ref 150–400)
RBC: 4.6 MIL/uL (ref 4.22–5.81)
RDW: 12.7 % (ref 11.5–15.5)
WBC: 3.4 10*3/uL — AB (ref 4.0–10.5)

## 2018-01-04 LAB — COMPREHENSIVE METABOLIC PANEL
ALBUMIN: 3.9 g/dL (ref 3.5–5.0)
ALT: 13 U/L (ref 0–44)
ANION GAP: 10 (ref 5–15)
AST: 18 U/L (ref 15–41)
Alkaline Phosphatase: 67 U/L (ref 38–126)
BUN: 7 mg/dL (ref 6–20)
CHLORIDE: 101 mmol/L (ref 98–111)
CO2: 27 mmol/L (ref 22–32)
Calcium: 9.6 mg/dL (ref 8.9–10.3)
Creatinine, Ser: 0.83 mg/dL (ref 0.61–1.24)
GFR calc Af Amer: >60 mL/min (ref >60)
GFR calc non Af Amer: >60 mL/min (ref >60)
GLUCOSE: 96 mg/dL (ref 70–99)
POTASSIUM: 3.8 mmol/L (ref 3.5–5.1)
SODIUM: 138 mmol/L (ref 135–145)
Total Bilirubin: 0.6 mg/dL (ref 0.3–1.2)
Total Protein: 6.4 g/dL — ABNORMAL LOW (ref 6.5–8.1)

## 2018-01-04 LAB — URINALYSIS, ROUTINE W REFLEX MICROSCOPIC
Bilirubin Urine: NEGATIVE
GLUCOSE, UA: NEGATIVE mg/dL
Hgb urine dipstick: NEGATIVE
Ketones, ur: NEGATIVE mg/dL
LEUKOCYTES UA: NEGATIVE
Nitrite: NEGATIVE
PH: 5 (ref 5.0–8.0)
Protein, ur: NEGATIVE mg/dL
SPECIFIC GRAVITY, URINE: 1.019 (ref 1.005–1.030)

## 2018-01-04 LAB — LIPASE, BLOOD: Lipase: 57 U/L — ABNORMAL HIGH (ref 11–51)

## 2018-01-04 LAB — I-STAT TROPONIN, ED: Troponin i, poc: 0 ng/mL (ref 0.00–0.08)

## 2018-01-04 MED ORDER — GI COCKTAIL ~~LOC~~
30.0000 mL | Freq: Once | ORAL | Status: AC
  Administered 2018-01-04: 30 mL via ORAL
  Filled 2018-01-04: qty 30

## 2018-01-04 NOTE — ED Provider Notes (Signed)
Patient placed in Quick Look pathway, seen and evaluated   Chief Complaint: pain with swalling  HPI:   Garrison Michie is a 62 y.o. male with a history of hypertension, presents to the emergency department for evaluation of 2 to 3 months of sore throat and pain with swallowing.  He reports he has pain in his chest and abdomen when he tries to eat food or water.  He reports he went to see his primary care doctor who did some x-rays and did not find anything, he reports he was started on a medication but is unsure which one and this did not seem to help his symptoms.  He has not followed up with anyone else regarding this.  He denies any associated nausea or vomiting.  No fevers or chills, no blood in his stool.  He reports chest pain is present primarily in the middle and when he is eating or after eating he has eaten.  He denies any associated shortness of breath.  ROS:+ Pain with swallowing, chest pain, abdominal pain. -Fevers, chills, cough, shortness of breath, diarrhea, melena or hematochezia  Physical Exam:   Gen: No distress  Neuro: Awake and Alert  Skin: Warm    Focused Exam: Heart with regular rate and rhythm and lungs clear to auscultation, there is mild tenderness across the middle of the chest and there is epigastric tenderness with mild guarding.   Initiation of care has begun. The patient has been counseled on the process, plan, and necessity for staying for the completion/evaluation, and the remainder of the medical screening examination    Janet Berlin 01/04/18 Rosebud, Ankit, MD 01/06/18 1535

## 2018-01-04 NOTE — ED Provider Notes (Signed)
[facility] EMERGENCY DEPARTMENT Provider Note   CSN: [id number] Arrival date & time: 01/04/18  1435     History   Chief Complaint Chief Complaint  Patient presents with  . Sore Throat    HPI Aaron Wang is a 61 y.o. male with history of GERD presenting for 3 months of pain with swallowing.  Patient describes his pain as a burning that radiates from the top of his chest down to his stomach.  Patient states that pain is constant and moderate in intensity for the last 4 months however it worsened with food.  Patient states that he has been seen by his primary care provider and prescribed omeprazole.  Patient states that he has been taking this as prescribed for the last 2 weeks without significant relief of symptoms.  Patient states that he is also begun using Zantac over the past week with little relief.  Patient denies fever, nausea/vomiting, diarrhea, shortness of breath, dyspnea on exertion.  HPI  Past Medical History:  Diagnosis Date  . Hypertension   . Injury    tore ligaments left knee    Patient Active Problem List   Diagnosis Date Noted  . Knee meniscus pain 02/09/2013  . Back pain 02/09/2013  . Carpal tunnel syndrome 02/09/2013    Past Surgical History:  Procedure Laterality Date  . BACK SURGERY    . KNEE SURGERY    . WRIST SURGERY     carpal tunnel        Home Medications    Prior to Admission medications   Medication Sig Start Date End Date Taking? Authorizing Provider  acetaminophen (TYLENOL) 500 MG tablet Take 500 mg by mouth daily.   Yes [provider]  ibuprofen (ADVIL,MOTRIN) 800 MG tablet Take 1 tablet (800 mg total) by mouth 3 (three) times daily. Patient taking differently: Take 800 mg by mouth every 8 (eight) hours as needed for mild pain.  03/27/15  Yes Browning, Robert, PA-C  lisinopril-hydrochlorothiazide (PRINZIDE) 20-12.5 MG per tablet Take 1 tablet by mouth daily. 04/13/12  Yes Tran, Bowie, PA-C  metoprolol  succinate (TOPROL-XL) 25 MG 24 hr tablet Take 1 tablet (25 mg total) by mouth daily. 04/13/12  Yes Tran, Bowie, PA-C  omeprazole (PRILOSEC) 20 MG capsule Take 20 mg by mouth 2 (two) times daily before a meal.   Yes [provider]  RA ASPIRIN EC ADULT LOW ST 81 MG EC tablet Take 81 mg by mouth daily.  10/11/15  Yes [provider]  ranitidine (ZANTAC) 150 MG tablet Take 150 mg by mouth 2 (two) times daily as needed for heartburn.   Yes [provider]  MOVIPREP 100 G SOLR Take 1 kit (200 g total) by mouth once. Patient not taking: Reported on 01/04/2018 09/18/14   Jacobs, Daniel P, MD    Family History Family History  Problem Relation Age of Onset  . Colon cancer Neg Hx     Social History Social History   Tobacco Use  . Smoking status: Current Every Day Smoker    Packs/day: 0.50  . Smokeless tobacco: Never Used  Substance Use Topics  . Alcohol use: Yes    Alcohol/week: 0.0 standard drinks    Comment: occasional; weekends  . Drug use: No     Allergies   Patient has no known allergies.   Review of Systems Review of Systems  Constitutional: Negative.  Negative for chills and fever.  HENT: Negative.  Negative for rhinorrhea and sore throat.     Eyes: Negative.  Negative for visual disturbance.  Respiratory: Negative.  Negative for cough and shortness of breath.   Cardiovascular: Positive for chest pain. Negative for palpitations and leg swelling.  Gastrointestinal: Positive for abdominal pain. Negative for blood in stool, diarrhea, nausea and vomiting.  Genitourinary: Negative.  Negative for dysuria and hematuria.  Musculoskeletal: Negative.  Negative for arthralgias and myalgias.  Skin: Negative.  Negative for rash.  Neurological: Negative.  Negative for dizziness, weakness and headaches.     Physical Exam Updated Vital Signs BP (!) 144/78   Pulse 62   Temp 98.2 F (36.8 C) (Oral)   Resp 16   SpO2 100%   Physical Exam  Constitutional: He is  oriented to person, place, and time. He appears well-developed and well-nourished. He does not appear ill. No distress.  HENT:  Head: Normocephalic and atraumatic.  Right Ear: External ear normal.  Left Ear: External ear normal.  Nose: Nose normal.  Mouth/Throat: Uvula is midline, oropharynx is clear and moist and mucous membranes are normal. No uvula swelling. No posterior oropharyngeal edema, posterior oropharyngeal erythema or tonsillar abscesses.  Eyes: Pupils are equal, round, and reactive to light. EOM are normal.  Neck: Trachea normal and normal range of motion. Neck supple. No tracheal deviation present.  Cardiovascular: Normal rate, regular rhythm, normal heart sounds and intact distal pulses.  Pulses:      Dorsalis pedis pulses are 2+ on the right side, and 2+ on the left side.       Posterior tibial pulses are 2+ on the right side, and 2+ on the left side.  Pulmonary/Chest: Effort normal and breath sounds normal. No respiratory distress. He exhibits no tenderness, no crepitus, no edema, no deformity and no swelling.  Abdominal: Soft. Bowel sounds are normal. There is tenderness in the epigastric area. There is no rigidity, no rebound, no guarding, no CVA tenderness, no tenderness at McBurney's point and negative Murphy's sign.  Patient with mild epigastric tenderness to palpation.  Musculoskeletal: Normal range of motion.  Feet:  Right Foot:  Protective Sensation: 3 sites tested. 3 sites sensed.  Left Foot:  Protective Sensation: 3 sites tested. 3 sites sensed.  Neurological: He is alert and oriented to person, place, and time. He has normal strength. GCS eye subscore is 4. GCS verbal subscore is 5. GCS motor subscore is 6.  Speech is clear and goal oriented, follows commands Major Cranial nerves without deficit, no facial droop Normal strength in upper and lower extremities bilaterally including dorsiflexion and plantar flexion, strong and equal grip strength Moves extremities  without ataxia, coordination intact Normal gait  Skin: Skin is warm and dry. Capillary refill takes less than 2 seconds.  Psychiatric: He has a normal mood and affect. His behavior is normal.   ED Treatments / Results  Labs (all labs ordered are listed, but only abnormal results are displayed) Labs Reviewed  CBC WITH DIFFERENTIAL/PLATELET - Abnormal; Notable for the following components:      Result Value   WBC 3.4 (*)    All other components within normal limits  COMPREHENSIVE METABOLIC PANEL - Abnormal; Notable for the following components:   Total Protein 6.4 (*)    All other components within normal limits  LIPASE, BLOOD - Abnormal; Notable for the following components:   Lipase 57 (*)    All other components within normal limits  URINALYSIS, ROUTINE W REFLEX MICROSCOPIC  I-STAT TROPONIN, ED    EKG None  Radiology Dg Chest 2 View  Result Date: 01/04/2018 CLINICAL DATA:  Worsening chest pain for 4 months. Shortness of breath. Smoker. EXAM: CHEST - 2 VIEW COMPARISON:  Chest radiograph Aug 16, 2009 FINDINGS: Cardiomediastinal silhouette is normal. Mild calcific atherosclerosis. Mild interstitial prominence. No pleural effusions or focal consolidations. Hyperinflation. Trachea projects midline and there is no pneumothorax. Soft tissue planes and included osseous structures are non-suspicious. IMPRESSION: Mild interstitial prominence and hyperinflation seen with COPD. No focal consolidation. Aortic Atherosclerosis (ICD10-I70.0). Electronically Signed   By: Courtnay  Bloomer M.D.   On: 01/04/2018 15:39    Procedures Procedures (including critical care time)  Medications Ordered in ED Medications  gi cocktail (Maalox,Lidocaine,Donnatal) (30 mLs Oral Given 01/04/18 1721)     Initial Impression / Assessment and Plan / ED Course  I have reviewed the triage vital signs and the nursing notes.  Pertinent labs & imaging results that were available during my care of the patient were  reviewed by me and considered in my medical decision making (see chart for details).  Clinical Course as of Jan 05 1920  Wed Jan 04, 2018  1804 Patient endorses complete resolution of pain with GI cocktail.   [BM]    Clinical Course User Index [BM] Morelli, Brandon A, PA-C   61-year-old male presented for 3 months of GERD-like symptoms.  Symptoms relieved today with single GI cocktail.  Patient endorsing complete relief of pain.  Troponin negative Urinalysis within normal limits CMP nonacute CBC nonacute Lipase of 57, patient given GI follow-up. Chest x-ray without acute findings EKG without acute abnormalities  Patient is to be discharged with recommendation to follow up with PCP in regards to today's hospital visit. Chest pain is not likely of cardiac or pulmonary etiology d/t presentation, VSS, no tracheal deviation, no JVD or new murmur, RRR, breath sounds equal bilaterally, EKG without acute abnormalities, negative troponin, and negative CXR. Pt has been advised to continue PPI and return to the ED is CP becomes exertional, associated with diaphoresis or nausea, radiates to left jaw/arm, worsens or becomes concerning in any way. Pt appears reliable for follow up and is agreeable to discharge.   Patient has been given gastroenterology follow-up.  At this time there does not appear to be any evidence of an acute emergency medical condition and the patient appears stable for discharge with appropriate outpatient follow up. Diagnosis was discussed with patient who verbalizes understanding of care plan and is agreeable to discharge. I have discussed return precautions with patient who verbalizes understanding of return precautions. Patient strongly encouraged to follow-up with their PCP. All questions answered.  Patient's case discussed with Dr. Wentz who agrees with plan to discharge with follow-up.     Note: Portions of this report may have been transcribed using voice recognition  software. Every effort was made to ensure accuracy; however, inadvertent computerized transcription errors may still be present.  Final Clinical Impressions(s) / ED Diagnoses   Final diagnoses:  Gastroesophageal reflux disease, esophagitis presence not specified    ED Discharge Orders    None       Morelli, Brandon A, PA-C 01/04/18 2335    Wentz, Elliott, MD 01/06/18 0011  

## 2018-01-04 NOTE — ED Triage Notes (Signed)
Pt presents to ED for assessment for throat, chest and abdominal pain x 2 months.  Seen by PCP, sent to "specialist".  Patient states xrays of his lungs taken.  States whenever he swallows food or water he has pain in his throat and chest.  Denies n/v/d.  C/o intermittent SOB, and pain with deep inspiration.

## 2018-01-04 NOTE — Discharge Instructions (Signed)
Please return to the Emergency Department for any new or worsening symptoms or if your symptoms do not improve. Please be sure to follow up with your Primary Care Physician as soon as possible regarding your visit today. If you do not have a Primary Doctor please use the resources below to establish one. Please call the gastroenterologist office for follow-up as soon as possible regarding your pain today. Please continue to take the omeprazole and Zantac as prescribed by your primary care provider to help with your pain.  Contact a health care provider if: Your chest pain does not go away. You have a rash with blisters on your chest. You have a fever. You have chills. Get help right away if: Your chest pain is worse. You have a cough that gets worse, or you cough up blood. You have severe pain in your abdomen. You have severe weakness. You faint. You have sudden, unexplained chest discomfort. You have sudden, unexplained discomfort in your arms, back, neck, or jaw. You have shortness of breath at any time. You suddenly start to sweat, or your skin gets clammy. You feel nauseous or you vomit. You suddenly feel light-headed or dizzy. Your heart begins to beat quickly, or it feels like it is skipping beats. Contact a health care provider if: You have new symptoms. You have unexplained weight loss. You have difficulty swallowing, or it hurts to swallow. You have wheezing or a persistent cough. Your symptoms do not improve with treatment. You have a hoarse voice. Get help right away if: You have pain in your arms, neck, jaw, teeth, or back. You feel sweaty, dizzy, or light-headed. You have chest pain or shortness of breath. You vomit and your vomit looks like blood or coffee grounds. You faint. Your stool is bloody or black. You cannot swallow, drink, or eat.  Do not take your medicine if  develop an itchy rash, swelling in your mouth or lips, or difficulty breathing.   RESOURCE  GUIDE  Chronic Pain Problems: Contact [facility]  [phone] Patients need to be referred by their primary care doctor.  Insufficient Money for Medicine: Contact United Way:  call "211" or Page [phone].  No Primary Care Doctor: Call Health Connect  [phone] - can help you locate a primary care doctor that  accepts your insurance, provides certain services, etc. Physician Referral [phone]  Agencies that provide inexpensive medical care: [facility]  [facility]  [phone] [facility]  [phone] [facility]  [phone] [facility]  [phone] [facility]  [phone]  [place] Providers: [facility]- [address]  [phone], Mon-Fri 9am-7pm, Sat 9am-1pm [place], [address] [place]  [place], [address] [place]  [place]- [address]  [place], [address], [phone]  Only accepts [place] Access [place] patients after they have their name  applied to their card  Self Pay (no insurance) in [facility]: Sickle Cell Patients: Dr Kevan Ny, [facility]  [place], [facility]- [place]  [facility] [place]- [place], [facility]- see information above (Speak to Raymond if you do not have insurance)       -  [facility]- [place], [place],  [place], [place]  Dr Vista Lawman-  [address], [place], [facility]- [address] [phone]       -  [facility]  [place]- [address], [place], also [address], [phone]       -    [facility]- [address], [place], 1st & 3rd Saturday   every month, 10am-1pm  1) Find a Doctor and Pay Out of Pocket Although you won't have to find out who is covered by your insurance plan, it is a good idea to ask around and get recommendations. You will then need to call the office and see if the doctor you have chosen will accept you as a new patient and what types of options they offer for patients who are self-pay. Some doctors offer discounts or will set up payment plans for their patients who do not have insurance, but you will need to ask so you aren't surprised when you get to your appointment.  2) Contact Your Local Health Department Not all health departments have doctors that can see patients for sick visits, but many do, so it is worth a call to see if yours does. If you don't know where your local health department is, you can check in your phone book. The CDC also has a tool to help you locate your state's health department, and many state websites also have listings of all of their local health departments.  3) Find a [facility] If your illness is not likely to be very severe or complicated, you may want to try a walk in clinic. These are popping up all over the country in pharmacies, drugstores, and shopping centers. They're usually staffed by nurse practitioners or physician assistants that have been trained to treat common illnesses and complaints. They're usually fairly quick and inexpensive. However, if you have serious medical issues or chronic medical problems, these are probably not your best option  [facility] [place], [facility], [address], [place], phone [phone] or [phone].  Monday - Friday, call for an appointment. [place], [facility], [address], [place], phone [phone] or [phone].  Monday - Friday, call for an appointment.  Abuse/Neglect: [place] [phone] [place] [phone] (After Hours)  Emergency Shelter:  [facility] [phone]  Maternity Homes: Room at the [place] [phone] [place] [phone]  MRSA Hotline #:   (585)301-4740  [facility] Dept. [address]         [place]  Sela Hua Phone:  [phone]                                  Phone:  [phone]                   Phone:  [place], [place]- [phone]       -     [facility] in [place], [address]                                  [phone], [place] [phone] or [phone] (After Hours)   [place]  Substance Abuse Resources: Alcohol and Drug Services  [phone] [place] [phone] [facility] [place] Chinita Pester [phone] Residential & Outpatient Substance Abuse Program  [phone]  Psychological Services: [place]  [phone] [place]  [place], [place]. [address] [place], [place]: [phone] or [phone], PicCapture.uy  Dental Assistance  If unable to pay or uninsured, contact:  [facility] or [facility]. to become qualified for the adult dental clinic.  Patients with Medicaid: [facility] [phone] [address]  [place], [place] [address] [phone]  If unable to pay, or uninsured, contact [facility] [phone]) or [place] [phone] in [place], [place] in [facility]) to become qualified for the adult dental clinic   Other [place]- [place], [place], [place], [address], [place], [place], 2nd and 4th Thursday of the month at 6:30am.  10 clients each day by appointment, can sometimes see walk-in patients if someone does not show for an appointment. [facility]- [address] Hillard Danker [place], [place], [address], [place], [place], [place], [address], [place] Department- [phone] [place] [facility] [phone]

## 2018-01-09 ENCOUNTER — Ambulatory Visit (INDEPENDENT_AMBULATORY_CARE_PROVIDER_SITE_OTHER): Payer: Medicaid Other | Admitting: Sports Medicine

## 2018-01-09 ENCOUNTER — Encounter: Payer: Self-pay | Admitting: Sports Medicine

## 2018-01-09 VITALS — BP 136/78 | Ht 69.0 in | Wt 129.0 lb

## 2018-01-09 DIAGNOSIS — M25562 Pain in left knee: Secondary | ICD-10-CM | POA: Diagnosis present

## 2018-01-09 MED ORDER — METHYLPREDNISOLONE ACETATE 40 MG/ML IJ SUSP
40.0000 mg | Freq: Once | INTRAMUSCULAR | Status: AC
  Administered 2018-01-09: 40 mg via INTRA_ARTICULAR

## 2018-01-09 NOTE — Progress Notes (Signed)
   Subjective:    Patient ID: Aaron Wang, male    DOB: 1955/06/30, 62 y.o.   MRN: [id number]  HPI chief complaint: Left knee pain  Aaron Wang comes in today requesting a repeat cortisone injection into his left knee.  He was last seen in the office in July 2017.  Injection at that time provided him with excellent symptom relief up until a few weeks ago.  An MRI of the left knee in 2012 showed a meniscal tear but he has done well with intermittent injections.  He does get occasional swelling.  He denies locking or catching in the knee.  Some mild instability but he has a double upright brace which does help.  The brace is quite worn however and he would like a new one.  Interim medical history reviewed Medications reviewed Allergies reviewed    Review of Systems    As above Objective:   Physical Exam  Well-developed, well-nourished.  No acute distress.  Awake alert and oriented x3.  Vital signs reviewed  Left knee: Full range of motion.  No effusion.  Does have some slight tenderness to palpation along the medial joint line but a negative McMurray's.  No tenderness along the lateral joint line.  Knee is stable ligamentous exam.  Neurovascularly intact distally.  Walking with a slight limp.      Assessment & Plan:  Returning left knee pain with MRI evidence of medial meniscal tear  Patient's left knee is injected with cortisone today.  An anterior lateral approach was utilized.  He tolerates this without difficulty.  I provided him with a new double upright brace to wear when active.  If symptoms persist, consider updated x-rays.  Follow-up for ongoing or recalcitrant issues.  Consent obtained and verified. Time-out conducted. Noted no overlying erythema, induration, or other signs of local infection. Skin prepped in a sterile fashion. Topical analgesic spray: Ethyl chloride. Joint: left knee Needle: 25g 1.5 inch Completed without difficulty. Meds: 3cc 1%xylocaine, 1cc (40mg )  depomedrol  Advised to call if fevers/chills, erythema, induration, drainage, or persistent bleeding.

## 2018-03-15 ENCOUNTER — Encounter: Payer: Self-pay | Admitting: Oncology

## 2018-03-15 ENCOUNTER — Telehealth: Payer: Self-pay | Admitting: Oncology

## 2018-03-15 NOTE — Telephone Encounter (Signed)
New referral received from Dr. Alessandra Bevels at [facility] for dx of esophageal cancer. Pt has been scheduled to see Dr. Benay Spice on 12/12 at 2pm. [facility], GI navigator has attempted to contact the pt multiple times, but the pt's vm is full. Spoke to [place] from [place] and made her aware of the appt date and time. Letter mailed to the pt.

## 2018-03-21 ENCOUNTER — Telehealth: Payer: Self-pay | Admitting: Oncology

## 2018-03-21 NOTE — Telephone Encounter (Signed)
Cld and spoke to the pt to r/s appt. He has a procedure being done at [facility] on 12/11 and he wasn't sure if he will fill up to coming to his appt on 12/12. Pt has been rescheduled to see Dr. Benay Spice on 12/19 at 2pm.

## 2018-03-23 ENCOUNTER — Inpatient Hospital Stay: Payer: Medicaid Other | Attending: Oncology | Admitting: Oncology

## 2018-03-23 ENCOUNTER — Encounter: Payer: Self-pay | Admitting: Oncology

## 2018-03-23 ENCOUNTER — Ambulatory Visit: Payer: Medicaid Other | Admitting: Oncology

## 2018-03-23 ENCOUNTER — Telehealth: Payer: Self-pay | Admitting: Oncology

## 2018-03-23 VITALS — BP 157/89 | HR 95 | Temp 98.4°F | Resp 17 | Ht 69.0 in | Wt 121.1 lb

## 2018-03-23 DIAGNOSIS — B9681 Helicobacter pylori [H. pylori] as the cause of diseases classified elsewhere: Secondary | ICD-10-CM | POA: Diagnosis not present

## 2018-03-23 DIAGNOSIS — R1310 Dysphagia, unspecified: Secondary | ICD-10-CM | POA: Diagnosis not present

## 2018-03-23 DIAGNOSIS — I10 Essential (primary) hypertension: Secondary | ICD-10-CM | POA: Diagnosis not present

## 2018-03-23 DIAGNOSIS — F17210 Nicotine dependence, cigarettes, uncomplicated: Secondary | ICD-10-CM | POA: Diagnosis not present

## 2018-03-23 DIAGNOSIS — C154 Malignant neoplasm of middle third of esophagus: Secondary | ICD-10-CM | POA: Insufficient documentation

## 2018-03-23 NOTE — Progress Notes (Signed)
[facility] New Patient Consult   Requesting GU:YQIHK Brahmbhatt  Alexey Rhoads 62 y.o.  1955-09-18    Reason for Consult: Esophagus cancer   HPI: Mr. Chir[place]oga reports a 3-85-month history of odynophagia.  This occurs with liquids and solids.  He was seen in the emergency room on 01/04/2018.  He was started on Protonix by his primary physician. He was referred to Dr. Alessandra Bevels and was taken to an upper endoscopy on 02/28/2018.  A 2 cm long ulcerated mass was found in the middle third of the esophagus at 30 cm from the incisors.  The mass was nonobstructing and partially circumferential.  Biopsies were obtained.  Scattered mild inflammation was found in the prepyloric stomach.  The area was biopsied.  Biopsies were also taken from "white specks "in the second portion of the duodenum.  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  The esophagus biopsy returned as invasive squamous cell carcinoma.  Mr. Rodier underwent an upper endoscopic ultrasound by Dr. Paulita Fujita yesterday.  A mass was found in the middle third of the esophagus.  The mass was staged as a T2N0 lesion by ultrasound.  He continues to have odynophagia.  Past Medical History:  Diagnosis Date  . Hypertension   . Injury    tore ligaments left knee    Past Surgical History:  Procedure Laterality Date  . BACK SURGERY    . KNEE SURGERY    . WRIST SURGERY     carpal tunnel    Medications: Reviewed  Allergies: No Known Allergies  Family history: No family history of cancer  Social History:   He lives alone in [place].  His girlfriend lives in [place].  He is on disability secondary to knee and back injuries.  He smokes 1 pack of cigarettes every 2 to 3 days.  He drinks 1-2 beers per day.  No risk factor for HIV or hepatitis.  ROS:   Positives include: 5-6 pound weight loss, solid and liquid odynophagia  A complete  ROS was otherwise negative.  Physical Exam:  Blood pressure (!) 157/89, pulse 95, temperature 98.4 F (36.9 C), temperature source Oral, resp. rate 17, height 5\' 9"  (1.753 m), weight 121 lb 1.6 oz (54.9 kg), SpO2 99 %.  HEENT: Oropharynx without visible mass, multiple missing teeth, neck without mass Lungs: Clear bilaterally Cardiac: Regular rate and rhythm Abdomen: No hepatosplenomegaly, no mass, nontender GU: Testes without mass Vascular: No leg edema Lymph nodes: Shotty posterior cervical, scalene, axillary, and inguinal nodes Neurologic: Alert and oriented, the motor exam appears intact in the upper and lower extremities bilaterally Skin: No rash Musculoskeletal: No spine tenderness   LAB:  CBC  Lab Results  Component Value Date   WBC 3.4 (L) 01/04/2018   HGB 14.5 01/04/2018   HCT 44.2 01/04/2018   MCV 96.1 01/04/2018   PLT 293 01/04/2018   NEUTROABS 1.8 01/04/2018        CMP  Lab Results  Component Value Date   NA 138 01/04/2018   K 3.8 01/04/2018   CL 101 01/04/2018   CO2 27 01/04/2018   GLUCOSE 96 01/04/2018   BUN 7 01/04/2018   CREATININE 0.83 01/04/2018   CALCIUM 9.6 01/04/2018   PROT 6.4 (L) 01/04/2018   ALBUMIN 3.9 01/04/2018   AST 18 01/04/2018   ALT 13 01/04/2018   ALKPHOS 67 01/04/2018   BILITOT 0.6 01/04/2018   GFRNONAA >60 01/04/2018  GFRAA >60 01/04/2018       Imaging:  None available  Assessment/Plan:   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 H. pylori gastritis diagnosed at endoscopy 02/28/2018-treated with medical therapy  3.  Odynophagia secondary to #1  4.  Hypertension     Disposition:   Mr. Bowlby has been diagnosed with squamous cell carcinoma of the esophagus.  He appears to have early stage disease based on the clinical evaluation to date.  He will be referred for a staging PET  scan.  He is scheduled to see Dr. Lisbeth Renshaw 03/28/2018.  I made a referral to Dr. Servando Snare.  He will follow a mechanical soft and liquid diet.  He will contact us for progressive odynophagia.  We discussed the treatment of esophagus cancer.  He may be a candidate for upfront surgical resection if the PET scan confirms early stage disease.  We will plan for concurrent chemotherapy and radiation if the PET scan reveals more advanced stage disease.  He plans to leave town for the holiday in 2 weeks.  He will be scheduled for a return visit on 04/10/2018.  Betsy Coder, MD  03/23/2018, 2:34 PM

## 2018-03-23 NOTE — Progress Notes (Signed)
  Oncology Nurse Navigator Documentation   Patient scheduled to see Dr. Lisbeth Renshaw on 03/28/18 @ 9:30 for nurse eval and 10:00 for initial consult.

## 2018-03-23 NOTE — Telephone Encounter (Signed)
Printed calendar and avs. °

## 2018-03-23 NOTE — Progress Notes (Signed)
  Oncology Nurse Navigator Documentation Met with patient during initial oncology consult. Patient and I have had multiple conversations prior to this point. I have given him a basic understanding of what the process will be moving forward. Referral to rad/onc placed. Patient will need referral to dietician for mid epigastric pain related to his cancer. Patient has a girlfriend that is supportive and family a few hours away. Patient may need transportation assistance. Will contact Ginette Otto to assist with this. I have reviewed the members on his treatment team and provided contact numbers for them as well as Dr. Benay Spice and myself. Plan ti call patient in next few days to offer support and to answer questions.

## 2018-03-24 ENCOUNTER — Telehealth: Payer: Self-pay

## 2018-03-24 ENCOUNTER — Telehealth: Payer: Self-pay | Admitting: Radiation Oncology

## 2018-03-24 ENCOUNTER — Encounter: Payer: Self-pay | Admitting: General Practice

## 2018-03-24 NOTE — Telephone Encounter (Signed)
Called pt re transportation program - couldn't leave vm due to vm being full.

## 2018-03-24 NOTE — Telephone Encounter (Signed)
  Oncology Nurse Navigator Documentation     Tried to call patient to provide contact number for Ginette Otto r/t transportation support. VM full. Could not leave VM.

## 2018-03-24 NOTE — Progress Notes (Signed)
[place] Psychosocial Distress Screening Clinical Social Work  Clinical Social Work was referred by distress screening protocol.  The patient scored a 5 on the Psychosocial Distress Thermometer which indicates moderate distress. Clinical Social Worker contacted patient by phone to assess for distress and other psychosocial needs. No answer, left VM w description of [place] services, contact information and encouragement to call for support/resources as needed.    ONCBCN DISTRESS SCREENING 03/23/2018  Screening Type Initial Screening  Distress experienced in past week (1-10) 5  Practical problem type Transportation  Family Problem type Partner  Emotional problem type Nervousness/Anxiety;Adjusting to illness;Isolation/feeling alone  Information Concerns Type Lack of info about diagnosis;Lack of info about treatment;Lack of info about complementary therapy choices;Lack of info about maintaining fitness  Physical Problem type Pain;Sleep/insomnia  Physician notified of physical symptoms Yes  Referral to clinical social work Yes  Other transportation coordinator    Clinical Social Worker follow up needed: No.  If yes, follow up plan:  Beverely Pace, [place], LCSW Clinical Social Worker Phone:  [phone]

## 2018-03-24 NOTE — Progress Notes (Signed)
  Oncology Nurse Navigator Documentation  Called [facility] to obtain Full Eus report. They will fax when located. Spoke with Crystal.

## 2018-03-28 ENCOUNTER — Ambulatory Visit: Payer: Medicaid Other

## 2018-03-28 ENCOUNTER — Ambulatory Visit
Admission: RE | Admit: 2018-03-28 | Discharge: 2018-03-28 | Disposition: A | Discharge status: Home / Self Care | Payer: Medicaid Other | Source: Ambulatory Visit | Attending: Radiation Oncology | Admitting: Radiation Oncology

## 2018-03-30 ENCOUNTER — Ambulatory Visit: Payer: Medicaid Other | Admitting: Oncology

## 2018-04-06 NOTE — Progress Notes (Signed)
Patient called and left VM asking to clarify upcoming appointments. I attempted to return call and was forwarded to patient's VM. Left detailed messages describing upcoming appointments and what they were for. Asked patient to call back if additional clarification is needed.

## 2018-04-11 ENCOUNTER — Ambulatory Visit (HOSPITAL_COMMUNITY)
Admission: RE | Admit: 2018-04-11 | Discharge: 2018-04-11 | Disposition: A | Discharge status: Home / Self Care | Payer: Medicaid Other | Source: Ambulatory Visit | Attending: Oncology | Admitting: Oncology

## 2018-04-11 DIAGNOSIS — C154 Malignant neoplasm of middle third of esophagus: Secondary | ICD-10-CM | POA: Insufficient documentation

## 2018-04-11 LAB — GLUCOSE, CAPILLARY: Glucose-Capillary: 103 mg/dL — ABNORMAL HIGH (ref 70–99)

## 2018-04-11 MED ORDER — FLUDEOXYGLUCOSE F - 18 (FDG) INJECTION
6.0000 | Freq: Once | INTRAVENOUS | Status: AC | PRN
  Administered 2018-04-11: 6 via INTRAVENOUS

## 2018-04-13 ENCOUNTER — Telehealth: Payer: Self-pay

## 2018-04-13 NOTE — Telephone Encounter (Signed)
  Oncology Nurse Navigator Documentation   Patient called and left VM asking me to call him back to review upcoming appointments. I returned patients call , no answer left VM.

## 2018-04-14 ENCOUNTER — Telehealth: Payer: Self-pay

## 2018-04-14 NOTE — Telephone Encounter (Signed)
  Oncology Nurse Navigator Documentation   Attempted to return call to patient at his request. Did not answer, I left VM requesting a call back.

## 2018-04-17 ENCOUNTER — Telehealth: Payer: Self-pay

## 2018-04-17 ENCOUNTER — Inpatient Hospital Stay: Payer: Medicaid Other | Attending: Oncology | Admitting: Nurse Practitioner

## 2018-04-17 ENCOUNTER — Encounter: Payer: Self-pay | Admitting: Nurse Practitioner

## 2018-04-17 ENCOUNTER — Encounter: Payer: Medicaid Other | Admitting: Cardiothoracic Surgery

## 2018-04-17 VITALS — BP 131/67 | HR 89 | Temp 98.4°F | Resp 18 | Ht 69.0 in | Wt 124.4 lb

## 2018-04-17 DIAGNOSIS — I10 Essential (primary) hypertension: Secondary | ICD-10-CM | POA: Insufficient documentation

## 2018-04-17 DIAGNOSIS — R1310 Dysphagia, unspecified: Secondary | ICD-10-CM | POA: Insufficient documentation

## 2018-04-17 DIAGNOSIS — R918 Other nonspecific abnormal finding of lung field: Secondary | ICD-10-CM

## 2018-04-17 DIAGNOSIS — C154 Malignant neoplasm of middle third of esophagus: Secondary | ICD-10-CM | POA: Diagnosis present

## 2018-04-17 NOTE — Progress Notes (Signed)
  [facility] OFFICE PROGRESS NOTE   Diagnosis: Esophagus cancer  INTERVAL HISTORY:   Aaron Wang returns as scheduled.  He overall feels well.  He is tolerating a fairly regular diet.  He has intermittent odynophagia with oral intake.  No nausea or vomiting.  No constipation or diarrhea.  He has a good appetite.  Objective:  Vital signs in last 24 hours:  Blood pressure 131/67, pulse 89, temperature 98.4 F (36.9 C), temperature source Oral, resp. rate 18, height 5\' 9"  (1.753 m), weight 124 lb 6.4 oz (56.4 kg), SpO2 99 %.    HEENT: No thrush or ulcers. Resp: Lungs clear bilaterally. Cardio: Regular rate and rhythm. GI: Abdomen soft and nontender.  No hepatomegaly. Vascular: No leg edema.   Lab Results:  Lab Results  Component Value Date   WBC 3.4 (L) 01/04/2018   HGB 14.5 01/04/2018   HCT 44.2 01/04/2018   MCV 96.1 01/04/2018   PLT 293 01/04/2018   NEUTROABS 1.8 01/04/2018    Imaging:  No results found.  Medications: I have reviewed the patient's current medications.  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 Upper endoscopy 02/28/2018- nonobstructing mass at 30 cm from the incisors ? EUS 03/22/2018-T2N0 mid esophagus tumor ? PET scan 04/11/2018- focally intense hypermetabolic activity associated with the known mass involving midesophagus.  No thoracic nodal uptake identified.  Small focus of activity in the porta hepatis, indeterminate for hepatic lesion versus a lymph node.  Separate hepatic lesions unchanged from 2011 CT.  Tiny right upper lobe pulmonary nodules, likely benign.  2. H. pylori gastritis diagnosed at endoscopy 02/28/2018-treated with medical therapy  3.  Odynophagia secondary to #1  4.  Hypertension  Disposition: Aaron Wang appears unchanged.  We reviewed the PET scan result and images with him at today's visit.  There is a small focus of increased uptake in the region of the porta hepatis.  We are  referring him for an MRI of the abdomen to further evaluate.  He has appointments scheduled with Dr. Servando Snare and Dr. Lisbeth Renshaw later this week.  We will see him back next week.  He will contact the office in the interim with any problems.  Patient seen with Dr. Benay Spice.    Ned Card ANP/GNP-BC   04/17/2018  11:41 AM

## 2018-04-17 NOTE — Telephone Encounter (Signed)
Printed avs and calender of upcoming appointment. Per 1/6 los 

## 2018-04-18 NOTE — Progress Notes (Signed)
  Oncology Nurse Navigator Documentation   Called patient to let him know that once MRI is authorized I will call central scheduling and have them reach out to patient to schedule MRI.  Aaron Wang has my contact information if does not hear back from them by Thursday. I reviewed upcoming appointments with rad/onc and Dr. Servando Snare.

## 2018-04-19 NOTE — Progress Notes (Signed)
GI Location of Tumor / Histology: Malignant neoplasm of middle third of esophagus- Invasive squamous cell carcinoma    Cancer Staging Staging form: Esophagus - Squamous Cell Carcinoma, AJCC 8th Edition - Clinical: Stage II (cT2, cN0, cM0, L: Middle)    Aaron Wang presented with painful swallowing solids and liquids.  PET scan 04/11/2018: There is intense hypermetabolic activity in the midesophagus, just distal to the carina (SUV max 13.0). There is mild corresponding esophageal wall thickening in this area, but no well-defined mass. There are no hypermetabolic mediastinal, hilar or axillary lymph nodes. There is no suspicious pulmonary activity  Upper endoscopy 02/28/2018: 2 cm long ulcerated mass in the middle third of the esophagus at 30 cm from the incisors.  Non-obstructing and partially circumferential.  Biopsies of Esophagus 03/06/2018    Past/Anticipated interventions by surgeon, if any:   Past/Anticipated interventions by medical oncology, if any:  Dr. Benay Spice 03/23/2018 -He will be referred for a staging PET scan. -I have made a referral to Dr. Servando Snare. -We discussed the treatment of esophagus cancer.  He may be a candidate for upfront surgical resection if the PET scan confirms early stage disease.  We will plan for concurrent chemotherapy and radiation if the PET scan reveals more advanced stage disease. -He will be scheduled for a return visit on 04/10/2018.   Weight chang/es, if any: Lost about 7 pounds.  Bowel/Bladder complaints, if any: No  Nausea / Vomiting, if any: No  Pain issues, if any:  8/10 in his chest.  Any blood per rectum:   No  BP (!) 152/90 (BP Location: Right Arm, Patient Position: Sitting)   Pulse 96   Temp 98.7 F (37.1 C) (Oral)   Resp 20   Ht 5' 8.5" (1.74 m)   Wt 119 lb 9.6 oz (54.3 kg)   SpO2 99%   BMI 17.92 kg/m    Wt Readings from Last 3 Encounters:  04/20/18 119 lb 9.6 oz (54.3 kg)  04/17/18 124 lb 6.4 oz (56.4  kg)  03/23/18 121 lb 1.6 oz (54.9 kg)    SAFETY ISSUES:  Prior radiation? No  Pacemaker/ICD? No  Possible current pregnancy? No  Is the patient on methotrexate? No  Current Complaints/Details:

## 2018-04-20 ENCOUNTER — Ambulatory Visit
Admission: RE | Admit: 2018-04-20 | Discharge: 2018-04-20 | Disposition: A | Discharge status: Home / Self Care | Payer: Medicaid Other | Source: Ambulatory Visit | Attending: Radiation Oncology | Admitting: Radiation Oncology

## 2018-04-20 ENCOUNTER — Encounter: Payer: Self-pay | Admitting: Radiation Oncology

## 2018-04-20 ENCOUNTER — Other Ambulatory Visit: Payer: Self-pay

## 2018-04-20 ENCOUNTER — Institutional Professional Consult (permissible substitution): Payer: Medicaid Other | Admitting: Cardiothoracic Surgery

## 2018-04-20 ENCOUNTER — Encounter: Payer: Self-pay | Admitting: Cardiothoracic Surgery

## 2018-04-20 VITALS — BP 152/90 | HR 96 | Temp 98.7°F | Resp 20 | Ht 68.5 in | Wt 119.6 lb

## 2018-04-20 VITALS — BP 138/84 | HR 99 | Ht 69.0 in | Wt 119.0 lb

## 2018-04-20 DIAGNOSIS — K769 Liver disease, unspecified: Secondary | ICD-10-CM | POA: Diagnosis not present

## 2018-04-20 DIAGNOSIS — J439 Emphysema, unspecified: Secondary | ICD-10-CM | POA: Diagnosis not present

## 2018-04-20 DIAGNOSIS — Z7982 Long term (current) use of aspirin: Secondary | ICD-10-CM | POA: Insufficient documentation

## 2018-04-20 DIAGNOSIS — C154 Malignant neoplasm of middle third of esophagus: Secondary | ICD-10-CM | POA: Diagnosis not present

## 2018-04-20 DIAGNOSIS — Z79899 Other long term (current) drug therapy: Secondary | ICD-10-CM | POA: Diagnosis not present

## 2018-04-20 DIAGNOSIS — M889 Osteitis deformans of unspecified bone: Secondary | ICD-10-CM | POA: Insufficient documentation

## 2018-04-20 DIAGNOSIS — R634 Abnormal weight loss: Secondary | ICD-10-CM | POA: Insufficient documentation

## 2018-04-20 DIAGNOSIS — F17210 Nicotine dependence, cigarettes, uncomplicated: Secondary | ICD-10-CM | POA: Insufficient documentation

## 2018-04-20 DIAGNOSIS — K2900 Acute gastritis without bleeding: Secondary | ICD-10-CM | POA: Insufficient documentation

## 2018-04-20 DIAGNOSIS — I10 Essential (primary) hypertension: Secondary | ICD-10-CM | POA: Diagnosis not present

## 2018-04-20 DIAGNOSIS — G893 Neoplasm related pain (acute) (chronic): Secondary | ICD-10-CM | POA: Diagnosis not present

## 2018-04-20 HISTORY — DX: Gastro-esophageal reflux disease without esophagitis: K21.9

## 2018-04-20 HISTORY — DX: Carpal tunnel syndrome, unspecified upper limb: G56.00

## 2018-04-20 HISTORY — DX: Malignant neoplasm of esophagus, unspecified: C15.9

## 2018-04-20 HISTORY — DX: Dorsalgia, unspecified: M54.9

## 2018-04-20 NOTE — Progress Notes (Signed)
  Oncology Nurse Navigator Documentation     MRI scheduled for 04/21/17 @ 9 AM. Patient aware to arrive by 8:30. NPO after 5 AM including gum and mints. Patient has map and directions to [facility]. I assisted patient from Rad/Onc to transportation coordinator to sign waver and obtain details about the transporation program. Patient has little support, no family in the area, SO passed away 04-01-18. Patient did identify a few neighbors that can be a source of support. I encouraged him to reach out or call LCSW's or chaplain for support. Contact info provided for team members. I also scheduled patient to meet with Dory Peru RD. Patient has been losing weight and is experiencing pain while eating. Copy of appointment calendar printed and reviewed with patient.   Patient verbalized understanding that he can call me with questions or concerns.

## 2018-04-20 NOTE — Progress Notes (Signed)
Radiation Oncology         [phone] ________________________________  Name: Aaron Wang        MRN: [id number]  Date of Service: 04/20/2018 DOB: 12-15-1955  ME:[id number], Christean Grief, MD  Ladell Pier, MD     REFERRING PHYSICIAN: Ladell Pier, MD   DIAGNOSIS: The encounter diagnosis was Primary cancer of middle third of esophagus ([place]).   HISTORY OF PRESENT ILLNESS: Aaron Wang is a 62 y.o. male seen at the request of Dr. 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 Mr. Milles had noted several months of odynophagia and was seen in the emergency room in September 2019.  He was started on a PPI, and was ultimately seen on 02/28/2018 with Dr. Alessandra Bevels for upper endoscopy which revealed a ulcerated mass in the middle third of the esophagus approximately 30 cm from the incisors measuring 2 cm.  The mass was nonobstructing but partially circumferential and his biopsies revealed invasive squamous cell carcinoma.  There were also abnormalities in the second portion of the duodenum and he had H pylori associated with acute gastritis.  He underwent EUS with Dr. Paulita Fujita on 03/22/2018 which revealed T2N0 staging by ultrasound, and he underwent PET scan on 04/11/2018.  This revealed focal abnormalities within the liver centrally adjacent to the intrahepatic portion of the main portal vein with an SUV of 4.4 without a clear hepatic lesion, there was no other hypermetabolic nodal abnormality in the abdomen or pelvis.  There was intense hypermetabolism in the mid esophagus with an SUV of 13 with mild esophageal wall thickening, without hypermetabolic mediastinal hilar or axillary adenopathy.  An MRI of the abdomen is pending at this time, and he comes today to discuss options of chemoradiation.  Given the T2 N0 staging, he may be a candidate for upfront esophagectomy and is also meeting with Dr. Servando Snare this afternoon.    PREVIOUS RADIATION THERAPY:  No   PAST MEDICAL HISTORY:  Past Medical History:  Diagnosis Date  . Hypertension   . Injury    tore ligaments left knee       PAST SURGICAL HISTORY: Past Surgical History:  Procedure Laterality Date  . BACK SURGERY    . KNEE SURGERY    . WRIST SURGERY     carpal tunnel     FAMILY HISTORY:  Family History  Problem Relation Age of Onset  . Colon cancer Neg Hx      SOCIAL HISTORY:  reports that he has been smoking. He has been smoking about 0.50 packs per day. He has never used smokeless tobacco. He reports current alcohol use. He reports that he does not use drugs.   ALLERGIES: Patient has no known allergies.   MEDICATIONS:  Current Outpatient Medications  Medication Sig Dispense Refill  . ibuprofen (ADVIL,MOTRIN) 800 MG tablet Take 1 tablet (800 mg total) by mouth 3 (three) times daily. (Patient taking differently: Take 800 mg by mouth every 8 (eight) hours as needed for mild pain. ) 21 tablet 0  . lisinopril-hydrochlorothiazide (PRINZIDE) 20-12.5 MG per tablet Take 1 tablet by mouth daily. 30 tablet 1  . RA ASPIRIN EC ADULT LOW ST 81 MG EC tablet Take 81 mg by mouth daily.   1   No current facility-administered medications for this encounter.      REVIEW OF SYSTEMS: On review of systems, the patient reports that he is doing well overall. He reports he's able to eat most foods but  states he has been having pain in the upper chest when he swallows most foods, liquids, and pain even without eatin. He states he's lost about 7-10 pounds. His weight is usually 130s. He denies any sternal chest pain, shortness of breath, cough, fevers, chills, night sweats, unintended weight changes. He denies any bowel or bladder disturbances, and denies abdominal pain, nausea or vomiting. He denies any new musculoskeletal or joint aches or pains. A complete review of systems is obtained and is otherwise negative.     PHYSICAL EXAM:  Wt Readings from Last 3 Encounters:  04/20/18 119 lb  9.6 oz (54.3 kg)  04/17/18 124 lb 6.4 oz (56.4 kg)  03/23/18 121 lb 1.6 oz (54.9 kg)   Temp Readings from Last 3 Encounters:  04/20/18 98.7 F (37.1 C) (Oral)  04/17/18 98.4 F (36.9 C) (Oral)  03/23/18 98.4 F (36.9 C) (Oral)   BP Readings from Last 3 Encounters:  04/20/18 (!) 152/90  04/17/18 131/67  03/23/18 (!) 157/89   Pulse Readings from Last 3 Encounters:  04/20/18 96  04/17/18 89  03/23/18 95   Pain Assessment Pain Score: 8 (Pain in the middle of his chest.) Pain Frequency: Constant Pain Loc: Chest/10  In general this is a thin but otherwise well appearing African American male in no acute distress. He is alert and oriented x4 and appropriate throughout the examination. HEENT reveals that the patient is normocephalic, atraumatic. EOMs are intact. Skin is intact without any evidence of gross lesions. Cardiopulmonary assessment is negative for acute distress and he exhibits normal effort.    ECOG = 1  0 - Asymptomatic (Fully active, able to carry on all predisease activities without restriction)  1 - Symptomatic but completely ambulatory (Restricted in physically strenuous activity but ambulatory and able to carry out work of a light or sedentary nature. For example, light housework, office work)  2 - Symptomatic, <50% in bed during the day (Ambulatory and capable of all self care but unable to carry out any work activities. Up and about more than 50% of waking hours)  3 - Symptomatic, >50% in bed, but not bedbound (Capable of only limited self-care, confined to bed or chair 50% or more of waking hours)  4 - Bedbound (Completely disabled. Cannot carry on any self-care. Totally confined to bed or chair)  5 - Death   Eustace Pen MM, Creech RH, Tormey DC, et al. [phone]). "Toxicity and response criteria of the [facility]".  Oncol. 5 (6): 649-55    LABORATORY DATA:  Lab Results  Component Value Date   WBC 3.4 (L) 01/04/2018   HGB 14.5  01/04/2018   HCT 44.2 01/04/2018   MCV 96.1 01/04/2018   PLT 293 01/04/2018   Lab Results  Component Value Date   NA 138 01/04/2018   K 3.8 01/04/2018   CL 101 01/04/2018   CO2 27 01/04/2018   Lab Results  Component Value Date   ALT 13 01/04/2018   AST 18 01/04/2018   ALKPHOS 67 01/04/2018   BILITOT 0.6 01/04/2018      RADIOGRAPHY: Nm Pet Image Initial (pi) Skull Base To Thigh  Result Date: 04/11/2018 CLINICAL DATA:  Initial treatment strategy for recently diagnosed midesophageal cancer. Current smoker. EXAM: NUCLEAR MEDICINE PET SKULL BASE TO THIGH TECHNIQUE: 6.0 mCi F-18 FDG was injected intravenously. Full-ring PET imaging was performed from the skull base to thigh after the radiotracer. CT data was obtained and used for attenuation correction and anatomic localization.  Fasting blood glucose: 103 mg/dl COMPARISON:  Abdominopelvic CT 08/16/2009. FINDINGS: Mediastinal blood pool activity: SUV max 1.7 NECK: No hypermetabolic cervical lymph nodes are identified.There are no lesions of the pharyngeal mucosal space. Incidental CT findings: none CHEST: There is intense hypermetabolic activity in the midesophagus, just distal to the carina (SUV max 13.0). There is mild corresponding esophageal wall thickening in this area, but no well-defined mass. There are no hypermetabolic mediastinal, hilar or axillary lymph nodes. There is no suspicious pulmonary activity. Incidental CT findings: Mild emphysema with asymmetric blebs at the right apex. Tiny subpleural nodules in the right upper lobe on images 17/8 and 31/8. No suspicious pulmonary nodules severe coronary artery atherosclerosis with lesser involvement of the aorta and great vessels. ABDOMEN/PELVIS: There is focal hypermetabolic activity centrally in the liver adjacent to the intrahepatic portion of the main portal vein. This has an SUV max of 4.4. No clear corresponding hepatic lesion is seen in this area on the CT images. This is indeterminate  for nodal versus hepatic uptake. No other abnormal activity is seen within the liver, pancreas or adrenal glands. There is no other hypermetabolic nodal activity in the abdomen or pelvis. Incidental CT findings: Aortic and branch vessel atherosclerosis. Separate from the uptake in the porta hepatis is a stable 1.4 cm low-density lesion in the right hepatic lobe lateral to the IVC (image 116/4), probably a hemangioma based on previous CT. There is an additional low-density lesion more posteriorly in the right hepatic lobe on image 125/4 which is also stable. SKELETON: There is no hypermetabolic activity to suggest osseous metastatic disease. Incidental CT findings: Stable sclerotic lesions in the left iliac bone on images 154 and 161 of series 4. IMPRESSION: 1. Focally intense hypermetabolic activity associated with the known mass involving midesophagus. No thoracic nodal uptake identified. 2. Small focus of activity in the porta hepatis, indeterminate for a hepatic lesion versus a lymph node, but suspicious for metastatic disease. MRI may be helpful for further evaluation. Separate hepatic lesions are unchanged from 2011 CT. 3. Tiny right upper lobe pulmonary nodules, likely benign. Electronically Signed   By: Richardean Sale M.D.   On: 04/11/2018 14:56       IMPRESSION/PLAN: 1. At least stage II, cT2N0 squamous cell carcinoma of the middle third esophagus.  Dr. Lisbeth Renshaw and I discussed the work-up that has occurred thus far including his pathology results.  If the patient did have disease in the hepatic region, he would be a candidate for chemoradiation for local control purposes, however if this is negative, he may be a surgical candidate.  If he is not a surgical candidate, then he would also be a candidate for chemoradiation.  We discussed the utility of chemoradiation, as well as the side effects including short and long-term effects of therapy as well as the delivery and logistics of treatment.  At the end  of the conversation, we will await the results of his MRI scan, as well as his assessment with Dr. Servando Snare.  If we will move forward with chemoradiation, we will contact him to coordinate simulation and proceed with reviewing and signing consent forms at that time. 2. Pain secondary to #1. The patient continues to take oral pain medication for his symptoms. We will follow this expectantly.  3. Weight loss. The patient has not lost a significant amount of weight but is thin to begin with an a baseline weight is usually in the 130# range. We did discuss the options of enteral feeding if  he had additional weight loss. He would like to avoid this at this time.   In a visit lasting 60 minutes, greater than 50% of the time was spent face to face discussing his case, and coordinating the patient's care.   The above documentation reflects my direct findings during this shared patient visit. Please see the separate note by Dr. Lisbeth Renshaw on this date for the remainder of the patient's plan of care.    Carola Rhine, PAC

## 2018-04-20 NOTE — Progress Notes (Signed)
[facility] [address]       [place] [address]             [phone]                    Aaron Wang  Medical Record #[id number] Date of Birth: 10/24/1955  Referring: Ladell Pier, MD Primary Care: Nolene Ebbs, MD Primary Cardiologist: No primary care provider on file.  Chief Complaint:    Chief Complaint  Patient presents with  . Esophageal Cancer    new patient consultation PET 04/11/18, Upper GI 02/28/18    History of Present Illness:    Aaron Wang 63 y.o. male is seen in the office  today for squamous cell carcinoma of the esophagus.  Several months ago the patient noted increasing episodes of painful swallowing.  He underwent upper GI endoscopy PET scan and EUS. He notes that he has lost approximately 6 pounds over the last 6 months.  He is a long-term smoker and is continuing to smoke.  Although he has no direct cardiac history of coronary known coronary artery disease he has extensive calcification of his coronary arteries noted on CT scan.    Current Activity/ Functional Status:  Patient is independent with mobility/ambulation, transfers, ADL's, IADL's.   Zubrod Score: At the time of surgery this patient's most appropriate activity status/level should be described as: []     0    Normal activity, no symptoms [x]     1    Restricted in physical strenuous activity but ambulatory, able to do out light work []     2    Ambulatory and capable of self care, unable to do work activities, up and about               >50 % of waking hours                              []     3    Only limited self care, in bed greater than 50% of waking hours []     4    Completely disabled, no self care, confined to bed or chair []     5    Moribund   Past Medical History:  Diagnosis Date  . Back pain   . Carpal tunnel syndrome   . Esophageal cancer ([place])   . GERD (gastroesophageal reflux disease)   . Hypertension   . Injury    tore ligaments left knee     Past Surgical History:  Procedure Laterality Date  . BACK SURGERY    . KNEE SURGERY    . WRIST SURGERY     carpal tunnel    Family History  Problem Relation Age of Onset  . Colon cancer Neg Hx      Social History   Tobacco Use  Smoking Status Current Every Day Smoker  . Packs/day: 0.50  Smokeless Tobacco Never Used    Social History   Substance and Sexual Activity  Alcohol Use Yes  . Alcohol/week: 0.0 standard drinks   Comment: occasional; weekends     No Known Allergies  Current Outpatient Medications  Medication Sig Dispense Refill  . ibuprofen (ADVIL,MOTRIN) 800 MG tablet Take 1 tablet (800 mg total) by mouth 3 (three) times daily. (Patient taking differently: Take 800 mg by mouth every 8 (eight) hours as needed for mild pain. ) 21 tablet 0  .  lisinopril-hydrochlorothiazide (PRINZIDE) 20-12.5 MG per tablet Take 1 tablet by mouth daily. 30 tablet 1  . RA ASPIRIN EC ADULT LOW ST 81 MG EC tablet Take 81 mg by mouth daily.   1   No current facility-administered medications for this visit.     Pertinent items are noted in HPI.   Review of Systems:     Cardiac Review of Systems: [Y] = yes  or   [ N ] = no   Chest Pain [   n ]  Resting SOB [   n] Exertional SOB  [ n ]  Orthopnea [n  ]   Pedal Edema [ n  ]    Palpitations [n  ] Syncope  Florencio.Farrier  ]   Presyncope [ n  ]   General Review of Systems: [Y] = yes [  ]=no Constitional: recent weight change Blue.Reese  ];  Wt loss over the last 3 months [ 6  ] anorexia [  ]; fatigue [ 6 ]; nausea [  ]; night sweats [  ]; fever [  ]; or chills [  ];           Eye : blurred vision [  ]; diplopia [   ]; vision changes [  ];  Amaurosis fugax[  ]; Resp: cough [  ];  wheezing[  ];  hemoptysis[  ]; shortness of breath[  ]; paroxysmal nocturnal dyspnea[  ]; dyspnea on exertion[  ]; or orthopnea[  ];  GI:  gallstones[  ], vomiting[  ];  dysphagia[  ]; melena[  ];  hematochezia [  ]; heartburn[  ];   Hx of  Colonoscopy[  ]; GU: kidney stones [   ]; hematuria[  ];   dysuria [  ];  nocturia[  ];  history of     obstruction [  ]; urinary frequency [  ]             Skin: rash, swelling[  ];, hair loss[  ];  peripheral edema[  ];  or itching[  ]; Musculosketetal: myalgias[  ];  joint swelling[  ];  joint erythema[  ];  joint pain[  ];  back pain[  ];  Heme/Lymph: bruising[  ];  bleeding[  ];  anemia[  ];  Neuro: TIA[  ];  headaches[  ];  stroke[  ];  vertigo[  ];  seizures[  ];   paresthesias[  ];  difficulty walking[  ];  Psych:depression[  ]; anxiety[  ];  Endocrine: diabetes[  ];  thyroid dysfunction[  ];  Immunizations: Flu up to date [  ]; Pneumococcal up to date [  ];  Other:     PHYSICAL EXAMINATION: BP 138/84 (BP Location: Left Arm, Patient Position: Sitting, Cuff Size: Normal)   Pulse 99   Ht 5\' 9"  (1.753 m)   Wt 119 lb (54 kg)   SpO2 98% Comment: RA  BMI 17.57 kg/m  General appearance: alert, cooperative, appears older than stated age and cachectic Head: Normocephalic, without obvious abnormality, atraumatic Neck: no adenopathy, no carotid bruit, no JVD, supple, symmetrical, trachea midline and thyroid not enlarged, symmetric, no tenderness/mass/nodules Lymph nodes: Cervical, supraclavicular, and axillary nodes normal. Resp: clear to auscultation bilaterally Cardio: regular rate and rhythm, S1, S2 normal, no murmur, click, rub or gallop GI: soft, non-tender; bowel sounds normal; no masses,  no organomegaly Extremities: extremities normal, atraumatic, no cyanosis or edema Neurologic: Grossly normal  Diagnostic Studies & Laboratory data:     Recent  Radiology Findings:   Nm Pet Image Initial (pi) Skull Base To Thigh  Result Date: 04/11/2018 CLINICAL DATA:  Initial treatment strategy for recently diagnosed midesophageal cancer. Current smoker. EXAM: NUCLEAR MEDICINE PET SKULL BASE TO THIGH TECHNIQUE: 6.0 mCi F-18 FDG was injected intravenously. Full-ring PET imaging was performed from the skull base to thigh after the  radiotracer. CT data was obtained and used for attenuation correction and anatomic localization. Fasting blood glucose: 103 mg/dl COMPARISON:  Abdominopelvic CT 08/16/2009. FINDINGS: Mediastinal blood pool activity: SUV max 1.7 NECK: No hypermetabolic cervical lymph nodes are identified.There are no lesions of the pharyngeal mucosal space. Incidental CT findings: none CHEST: There is intense hypermetabolic activity in the midesophagus, just distal to the carina (SUV max 13.0). There is mild corresponding esophageal wall thickening in this area, but no well-defined mass. There are no hypermetabolic mediastinal, hilar or axillary lymph nodes. There is no suspicious pulmonary activity. Incidental CT findings: Mild emphysema with asymmetric blebs at the right apex. Tiny subpleural nodules in the right upper lobe on images 17/8 and 31/8. No suspicious pulmonary nodules severe coronary artery atherosclerosis with lesser involvement of the aorta and great vessels. ABDOMEN/PELVIS: There is focal hypermetabolic activity centrally in the liver adjacent to the intrahepatic portion of the main portal vein. This has an SUV max of 4.4. No clear corresponding hepatic lesion is seen in this area on the CT images. This is indeterminate for nodal versus hepatic uptake. No other abnormal activity is seen within the liver, pancreas or adrenal glands. There is no other hypermetabolic nodal activity in the abdomen or pelvis. Incidental CT findings: Aortic and branch vessel atherosclerosis. Separate from the uptake in the porta hepatis is a stable 1.4 cm low-density lesion in the right hepatic lobe lateral to the IVC (image 116/4), probably a hemangioma based on previous CT. There is an additional low-density lesion more posteriorly in the right hepatic lobe on image 125/4 which is also stable. SKELETON: There is no hypermetabolic activity to suggest osseous metastatic disease. Incidental CT findings: Stable sclerotic lesions in the left  iliac bone on images 154 and 161 of series 4. IMPRESSION: 1. Focally intense hypermetabolic activity associated with the known mass involving midesophagus. No thoracic nodal uptake identified. 2. Small focus of activity in the porta hepatis, indeterminate for a hepatic lesion versus a lymph node, but suspicious for metastatic disease. MRI may be helpful for further evaluation. Separate hepatic lesions are unchanged from 2011 CT. 3. Tiny right upper lobe pulmonary nodules, likely benign. Electronically Signed   By: Richardean Sale M.D.   On: 04/11/2018 14:56     I have independently reviewed the above radiology studies  and reviewed the findings with the patient.   Recent Lab Findings: Lab Results  Component Value Date   WBC 3.4 (L) 01/04/2018   HGB 14.5 01/04/2018   HCT 44.2 01/04/2018   PLT 293 01/04/2018   GLUCOSE 96 01/04/2018   LDLDIRECT 112 (H) 11/26/2009   ALT 13 01/04/2018   AST 18 01/04/2018   NA 138 01/04/2018   K 3.8 01/04/2018   CL 101 01/04/2018   CREATININE 0.83 01/04/2018   BUN 7 01/04/2018   CO2 27 01/04/2018   TSH 0.925 11/26/2009                Cancer Staging Primary cancer of middle third of esophagus (HCC) Staging form: Esophagus - Squamous Cell Carcinoma, AJCC 8th Edition - Clinical: Stage II (cT2, cN0, cM0, L: Middle) - Signed by  Ladell Pier, MD on 03/23/2018   Assessment / Plan:   Patient undergoing further staging work-up for a T2 squamous cell carcinoma of the mid esophagus, is to have MRI of the abdomen to evaluate question of the hypermetabolic portal hepatis lymph nodes.  We will obtain cardiology clearance for potential esophagectomy with abdominal and probably direct right chest resection and possible cervical anastomosis.  Follow the patient has had no known cardiac history, he is at high risk with long-term smoking history and extensive calcification of his coronary arteries noted on CT scan Discussed with the patient possible surgical  resection pros and cons and description of the extent of surgery, he was also given printed material concerning esophagectomy  I will see the patient back soon after MRI of the abdomen is done, pulmonary function studies and cardiac clearance for possible esophagectomy.  I  spent 60 minutes with  the patient face to face and greater then 50% of the time was spent in counseling and coordination of care.    [name] MD      [address] [place] [address] Office [phone]   Beeper [phone]  04/20/2018 3:36 PM

## 2018-04-21 ENCOUNTER — Other Ambulatory Visit: Payer: Self-pay | Admitting: *Deleted

## 2018-04-21 ENCOUNTER — Encounter: Payer: Self-pay | Admitting: General Practice

## 2018-04-21 ENCOUNTER — Ambulatory Visit (HOSPITAL_COMMUNITY)
Admission: RE | Admit: 2018-04-21 | Discharge: 2018-04-21 | Disposition: A | Discharge status: Home / Self Care | Payer: Medicaid Other | Source: Ambulatory Visit | Attending: Nurse Practitioner | Admitting: Nurse Practitioner

## 2018-04-21 DIAGNOSIS — C154 Malignant neoplasm of middle third of esophagus: Secondary | ICD-10-CM | POA: Insufficient documentation

## 2018-04-21 DIAGNOSIS — C159 Malignant neoplasm of esophagus, unspecified: Secondary | ICD-10-CM

## 2018-04-21 LAB — POCT I-STAT CREATININE: Creatinine, Ser: 0.8 mg/dL (ref 0.61–1.24)

## 2018-04-21 MED ORDER — GADOBUTROL 1 MMOL/ML IV SOLN
5.0000 mL | Freq: Once | INTRAVENOUS | Status: AC | PRN
  Administered 2018-04-21: 5 mL via INTRAVENOUS

## 2018-04-21 NOTE — Progress Notes (Signed)
[place] Psychosocial Distress Screening Clinical Social Work  Clinical Social Work was referred by distress screening protocol.  The patient scored a 7 on the Psychosocial Distress Thermometer which indicates moderate distress. Clinical Social Worker contacted patient by phone to assess for distress and other psychosocial needs. Unable to reach by phone, left VM w information about [place] and encouragement to call for help/resources.   ONCBCN DISTRESS SCREENING 04/20/2018  Screening Type Initial Screening  Distress experienced in past week (1-10) 7  Practical problem type Transportation  Family Problem type Other (comment)  Emotional problem type Isolation/feeling alone  Spiritual/Religous concerns type Relating to God  Information Concerns Type   Physical Problem type Pain  Physician notified of physical symptoms   Referral to clinical social work   Other Reached via cell: [place] Social Worker follow up needed: No. await return call, CSW [place] also asked to reach out.   If yes, follow up plan:  Beverely Pace, [place], LCSW Clinical Social Worker Phone:  [phone]

## 2018-04-21 NOTE — Progress Notes (Unsigned)
PFT

## 2018-04-24 ENCOUNTER — Other Ambulatory Visit: Payer: Self-pay | Admitting: Nurse Practitioner

## 2018-04-25 ENCOUNTER — Telehealth: Payer: Self-pay

## 2018-04-25 ENCOUNTER — Telehealth: Payer: Self-pay | Admitting: Nurse Practitioner

## 2018-04-25 NOTE — Telephone Encounter (Signed)
Have attempted to call patient 4 times to relay MRI results. Unable to leave message. VM full.

## 2018-04-25 NOTE — Telephone Encounter (Signed)
Spoke with patient and confirmed his ride for tomorrow. Told him he should be getting confirmation texts within the next few hours.

## 2018-04-26 ENCOUNTER — Ambulatory Visit (HOSPITAL_COMMUNITY)
Admission: RE | Admit: 2018-04-26 | Discharge: 2018-04-26 | Disposition: A | Discharge status: Home / Self Care | Payer: Medicaid Other | Source: Ambulatory Visit | Attending: Cardiothoracic Surgery | Admitting: Cardiothoracic Surgery

## 2018-04-26 ENCOUNTER — Encounter: Payer: Self-pay | Admitting: Nurse Practitioner

## 2018-04-26 ENCOUNTER — Telehealth: Payer: Self-pay | Admitting: General Practice

## 2018-04-26 ENCOUNTER — Inpatient Hospital Stay: Payer: Medicaid Other | Admitting: Nutrition

## 2018-04-26 ENCOUNTER — Inpatient Hospital Stay (HOSPITAL_BASED_OUTPATIENT_CLINIC_OR_DEPARTMENT_OTHER): Payer: Medicaid Other | Admitting: Nurse Practitioner

## 2018-04-26 VITALS — BP 145/84 | HR 72 | Temp 98.1°F | Resp 17 | Ht 69.0 in | Wt 119.5 lb

## 2018-04-26 DIAGNOSIS — I10 Essential (primary) hypertension: Secondary | ICD-10-CM | POA: Diagnosis not present

## 2018-04-26 DIAGNOSIS — C159 Malignant neoplasm of esophagus, unspecified: Secondary | ICD-10-CM | POA: Diagnosis present

## 2018-04-26 DIAGNOSIS — R918 Other nonspecific abnormal finding of lung field: Secondary | ICD-10-CM

## 2018-04-26 DIAGNOSIS — C154 Malignant neoplasm of middle third of esophagus: Secondary | ICD-10-CM

## 2018-04-26 DIAGNOSIS — R1310 Dysphagia, unspecified: Secondary | ICD-10-CM

## 2018-04-26 LAB — PULMONARY FUNCTION TEST
DL/VA % pred: 83 %
DL/VA: 3.68 ml/min/mmHg/L
DLCO unc % pred: 82 %
DLCO unc: 23.48 ml/min/mmHg
FEF 25-75 Pre: 3.78 L/sec
FEF2575-%Pred-Pre: 143 %
FEV1-%Pred-Pre: 134 %
FEV1-Pre: 3.73 L
FEV1FVC-%Pred-Pre: 103 %
FEV6-%Pred-Pre: 133 %
FEV6-Pre: 4.6 L
FEV6FVC-%Pred-Pre: 104 %
FVC-%Pred-Pre: 128 %
FVC-Pre: 4.6 L
Pre FEV1/FVC ratio: 81 %
Pre FEV6/FVC Ratio: 100 %
RV % pred: 109 %
RV: 2.27 L
TLC % pred: 105 %
TLC: 6.73 L

## 2018-04-26 MED ORDER — ALBUTEROL SULFATE (2.5 MG/3ML) 0.083% IN NEBU: 2.5000 mg | INHALATION_SOLUTION | Freq: Once | RESPIRATORY_TRACT | Status: DC

## 2018-04-26 MED ORDER — TRAMADOL HCL 50 MG PO TABS: 50.0000 mg | ORAL_TABLET | Freq: Two times a day (BID) | ORAL | 0 refills | Status: DC | PRN

## 2018-04-26 NOTE — Telephone Encounter (Signed)
[place] CSW Progress Notes  Call to patient to reiterate availability of [place] resources.  Pt was assessed by dietitian today, states he has stable housing and food.  Left VM w my contact information should other needs arise.  Edwyna Shell, LCSW Clinical Social Worker Phone:  [phone]

## 2018-04-26 NOTE — Progress Notes (Addendum)
  [place] OFFICE PROGRESS NOTE   Diagnosis: Esophagus cancer  INTERVAL HISTORY:   [name] returns as scheduled.  He overall feels well.  He has a good appetite.  He continues to have pain with swallowing.  Ibuprofen has not been effective.  He wonders if he could have something stronger.  He denies nausea.  He was not aware of the appointment for PFTs today.  Objective:  Vital signs in last 24 hours:  Blood pressure (!) 145/84, pulse 72, temperature 98.1 F (36.7 C), temperature source Oral, resp. rate 17, height 5\' 9"  (1.753 m), weight 119 lb 8 oz (54.2 kg), SpO2 100 %.    HEENT: No thrush or ulcers. Resp: Lungs clear bilaterally. Cardio: Regular rate and rhythm. GI: Abdomen soft and nontender.  No hepatomegaly. Vascular: No leg edema.   Lab Results:  Lab Results  Component Value Date   WBC 3.4 (L) 01/04/2018   HGB 14.5 01/04/2018   HCT 44.2 01/04/2018   MCV 96.1 01/04/2018   PLT 293 01/04/2018   NEUTROABS 1.8 01/04/2018    Imaging:  No results found.  Medications: I have reviewed the patient's current medications.  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 Upper endoscopy 02/28/2018- nonobstructing mass at 30 cm from the incisors ? EUS 03/22/2018-T2N0 mid esophagus tumor ? PET scan 04/11/2018- focally intense hypermetabolic activity associated with the known mass involving midesophagus.  No thoracic nodal uptake identified.  Small focus of activity in the porta hepatis, indeterminate for hepatic lesion versus a lymph node.  Separate hepatic lesions unchanged from 2011 CT.  Tiny right upper lobe pulmonary nodules, likely benign. ? MRI abdomen 04/21/2018-no hepatic lesion identified to correspond to the small focus of hypermetabolic activity on PET.  2 benign hemangioma right hepatic lobe.  No evidence of metastatic adenopathy or liver metastasis.  2. H. pylori gastritis diagnosed at endoscopy 02/28/2018-treated with  medical therapy  3.Odynophagia secondary to #1  4.Hypertension  Disposition: [name] appears stable.  We reviewed the results from the recent MRI.  There is no evidence of metastatic disease.  Specifically no hepatic lesion corresponding to the area of hypermetabolic activity on the PET scan.  He has follow-up with Dr. Servando Snare tomorrow.  He was not aware of the appointment for PFTs earlier today.  We will contact respiratory therapy to try and get this rescheduled.  He continues to have odynophagia.  Ibuprofen has not been effective.  We will send a prescription to his pharmacy for tramadol 50 mg every 12 hours as needed.  He understands he should not be driving while taking pain medication.  We will schedule a return visit here in approximately 5 weeks.  He will contact the office in the interim with any problems.  Patient seen with Dr. Benay Spice.    Ned Card ANP/GNP-BC   04/26/2018  10:53 AM  This was a shared visit with Ned Card.  [name] appears to have early stage esophagus cancer.  He is undergoing a preoperative evaluation by Dr. Servando Snare.  We will plan to see him after surgery unless he is found to not be a surgical candidate.

## 2018-04-26 NOTE — Progress Notes (Signed)
Nutrition Assessment   Reason for Assessment:   Referral for esophageal cancer, weight loss.     ASSESSMENT:  63 year old male with esophageal cancer followed by Dr. Benay Spice.  Past medical history of tobacco use, GERD, HTN.  Patient reports will be meeting with Dr Servando Snare tomorrow to discuss possible surgery.  Plan of care to be determined following meeting with surgeon.    Met with patient this pm in clinic, early for nutrition appointment (2:30pm).  Patient reports appetite is good but having pain in mid-chest area after swallowing food.  This has caused a decrease in the volume of food he is eating. Reports odynophagia since late August.   He reports crunchy foods cause more pain than others (ie fried chicken).  Reports this am at 5 had cheerios and milk then went back to bed and got up and ate 2 boiled eggs.  Yesterday am had egg with 2 sausage patties and toast.  Lunch yesterday was hot dog with bun and 2nd hot dog no bun and few ritz crackers.  Supper last night was 2 fried perch filets and macaroni and cheese.   Patient reported that within the last 5 years he has been homeless but confirms that he has stable housing now.  Reports that he has access to food and the ability to prepare food at home and store food properly.    Reports that he does not have any support person at this time.  Lives alone.  Lost significant other mid December 4827 from complications from amputation due to DM.    Nutrition Focused Physical Exam: deferred   Medications: tramadol added for pain today   Labs: reviewed   Anthropometrics:   Height: 69 inches Weight: 119 lb 8 oz UBW: 135-140 lb about 5 years ago per patient.   Reports in the last 5 years was homeless, found stable housing and recently lost significant other (mid December 2019) and odynophagia.  BMI: 17  Noted 01/09/2018 wt of 129 lb per chart 8% weight loss in the last 2 1/2 months, significant   Estimated Energy Needs  Kcals:  1620-1890 calories Protein: 81-95 g Fluid: 1.8 L   NUTRITION DIAGNOSIS: Inadequate oral intake related to cancer, odynophagia, loss of significant other as evidenced by 8% weight loss in the last 2 1/2 months and eating less volume than normal.     MALNUTRITION DIAGNOSIS: likely, will continue to monitor   INTERVENTION:  Discussed importance of good nutrition during course of treatment.   Discussed soft, moist foods to easy odynophagia.  Handout provided along with High Calorie, High protein recipes.  Patient reports he has many of these foods at home.  Provided samples of oral nutrition supplements to try along with coupons.   Discussed with patient that he can meet with Education officer, museum for support.  Patient has contact information for Social Workers.  RD encouraged patient to reach out as needed.   Contact information given to patient   MONITORING, EVALUATION, GOAL: patient will consume adequate calories and protein to prevent weight loss   Next Visit: to be determined based on treatment plan  Keiondre Colee B. Zenia Resides, [place], [place] Registered Dietitian [phone] (pager)

## 2018-04-27 ENCOUNTER — Telehealth: Payer: Self-pay | Admitting: Nurse Practitioner

## 2018-04-27 ENCOUNTER — Ambulatory Visit: Payer: Medicaid Other | Admitting: Cardiothoracic Surgery

## 2018-04-27 ENCOUNTER — Encounter: Payer: Self-pay | Admitting: Cardiothoracic Surgery

## 2018-04-27 ENCOUNTER — Other Ambulatory Visit: Payer: Self-pay

## 2018-04-27 VITALS — BP 130/77 | HR 100 | Resp 18 | Ht 69.0 in | Wt 120.0 lb

## 2018-04-27 DIAGNOSIS — C154 Malignant neoplasm of middle third of esophagus: Secondary | ICD-10-CM | POA: Diagnosis not present

## 2018-04-27 NOTE — Progress Notes (Signed)
[address]       [place] [address]             [phone]                    A[place] [facility] Medical Record #[id number] Date of Birth: 09-27-1955  Referring: Aaron Pier, MD Primary Care: Aaron Ebbs, MD Primary Cardiologist: No primary care provider on file.  Chief Complaint:    Chief Complaint  Patient presents with  . Esophageal Cancer    f/u after MRI    History of Present Illness:    Aaron [place] 63 y.o. male is seen in the office  today for squamous cell carcinoma of the esophagus.  Several months ago the patient noted increasing episodes of painful swallowing.  He underwent upper GI endoscopy PET scan and EUS. He notes that he has lost approximately 6 pounds over the last 6 months.  He is a long-term smoker and is continuing to smoke.  He has significantly cut down on his smoking now just a few cigarettes a day.  MRI has been completed to rule out the possibility of liver metastasis, and there is no evidence of liver metastasis.  Patient is still waiting for cardiology appointment.  His concerns today are about a place to live after surgery, he notes that for the past 3 years he has been living in the budget and motel [id number] and staying in room [address]  Although he has no direct cardiac history of coronary known coronary artery disease he has extensive calcification of his coronary arteries noted on CT scan.    Current Activity/ Functional Status:  Patient is independent with mobility/ambulation, transfers, ADL's, IADL's.   Zubrod Score: At the time of surgery this patient's most appropriate activity status/level should be described as: []     0    Normal activity, no symptoms [x]     1    Restricted in physical strenuous activity but ambulatory, able to do out light work []     2    Ambulatory and capable of self care, unable to do work activities, up and about               >50 % of waking hours                              []      3    Only limited self care, in bed greater than 50% of waking hours []     4    Completely disabled, no self care, confined to bed or chair []     5    Moribund   Past Medical History:  Diagnosis Date  . Back pain   . Carpal tunnel syndrome   . Esophageal cancer ([place])   . GERD (gastroesophageal reflux disease)   . Hypertension   . Injury    tore ligaments left knee    Past Surgical History:  Procedure Laterality Date  . BACK SURGERY    . KNEE SURGERY    . WRIST SURGERY     carpal tunnel    Family History  Problem Relation Age of Onset  . Colon cancer Neg Hx      Social History   Tobacco Use  Smoking Status Current Every Day Smoker  . Packs/day: 0.50  Smokeless Tobacco Never Used    Social History   Substance and  Sexual Activity  Alcohol Use Yes  . Alcohol/week: 0.0 standard drinks   Comment: occasional; weekends     No Known Allergies  Current Outpatient Medications  Medication Sig Dispense Refill  . ibuprofen (ADVIL,MOTRIN) 800 MG tablet Take 1 tablet (800 mg total) by mouth 3 (three) times daily. (Patient taking differently: Take 800 mg by mouth every 8 (eight) hours as needed for mild pain. ) 21 tablet 0  . lisinopril-hydrochlorothiazide (PRINZIDE) 20-12.5 MG per tablet Take 1 tablet by mouth daily. 30 tablet 1  . RA ASPIRIN EC ADULT LOW ST 81 MG EC tablet Take 81 mg by mouth daily.   1  . traMADol (ULTRAM) 50 MG tablet Take 1 tablet (50 mg total) by mouth every 12 (twelve) hours as needed. 20 tablet 0   No current facility-administered medications for this visit.     Pertinent items are noted in HPI.   Review of Systems:     Cardiac Review of Systems: [Y] = yes  or   [ N ] = no   Chest Pain [ N ]  Resting SOB [  N] Exertional SOB  Aqua.Slicker ]  Vertell Limber Aqua.Slicker ]   Pedal Edema Aqua.Slicker ]    Palpitations Aqua.Slicker ] Syncope  Aqua.Slicker ]   Presyncope [ N ]   General Review of Systems: [Y] = yes [  ]=no Constitional: recent weight change Blue.Reese  ];  Wt loss over the last 3 months [ 6   ] anorexia [  ]; fatigue [ 6 ]; nausea [  ]; night sweats [  ]; fever [  ]; or chills [  ];           Eye : blurred vision [  ]; diplopia [   ]; vision changes [  ];  Amaurosis fugax[  ]; Resp: cough [  ];  wheezing[  ];  hemoptysis[  ]; shortness of breath[  ]; paroxysmal nocturnal dyspnea[  ]; dyspnea on exertion[  ]; or orthopnea[  ];  GI:  gallstones[  ], vomiting[  ];  dysphagia[  ]; melena[  ];  hematochezia [  ]; heartburn[  ];   Hx of  Colonoscopy[  ]; GU: kidney stones [  ]; hematuria[  ];   dysuria [  ];  nocturia[  ];  history of     obstruction [  ]; urinary frequency [  ]             Skin: rash, swelling[  ];, hair loss[  ];  peripheral edema[  ];  or itching[  ]; Musculosketetal: myalgias[  ];  joint swelling[  ];  joint erythema[  ];  joint pain[  ];  back pain[  ];  Heme/Lymph: bruising[  ];  bleeding[  ];  anemia[  ];  Neuro: TIA[  ];  headaches[  ];  stroke[  ];  vertigo[  ];  seizures[  ];   paresthesias[  ];  difficulty walking[  ];  Psych:depression[  ]; anxiety[  ];  Endocrine: diabetes[  ];  thyroid dysfunction[  ];  Immunizations: Flu up to date [  ]; Pneumococcal up to date [  ];  Other:     PHYSICAL EXAMINATION: BP 130/77 (BP Location: Right Arm, Patient Position: Sitting, Cuff Size: Normal)   Pulse 100   Resp 18   Ht 5\' 9"  (1.753 m)   Wt 120 lb (54.4 kg)   SpO2 97% Comment: RA  BMI 17.72 kg/m   General appearance:  alert, cooperative, appears stated age and cachectic Head: Normocephalic, without obvious abnormality, atraumatic Neck: no adenopathy, no carotid bruit, no JVD, supple, symmetrical, trachea midline and thyroid not enlarged, symmetric, no tenderness/mass/nodules Lymph nodes: Cervical, supraclavicular, and axillary nodes normal. Resp: clear to auscultation bilaterally Cardio: regular rate and rhythm, S1, S2 normal, no murmur, click, rub or gallop GI: soft, non-tender; bowel sounds normal; no masses,  no organomegaly Extremities: extremities  normal, atraumatic, no cyanosis or edema and Homans sign is negative, no sign of DVT Neurologic: Grossly normal   Diagnostic Studies & Laboratory data:     Recent Radiology Findings:   Mr Abdomen W Wo Contrast  Result Date: 04/21/2018 CLINICAL DATA:  Esophageal carcinoma. Suspicious lesion in the liver on FDG PET scan. MRI recommended for further characterization. EXAM: MRI ABDOMEN WITHOUT AND WITH CONTRAST TECHNIQUE: Multiplanar multisequence MR imaging of the abdomen was performed both before and after the administration of intravenous contrast. CONTRAST:  5 mL Gadavist COMPARISON:  PET-CT scan 04/11/2018 FINDINGS: Lower chest:  Lung bases are clear. Hepatobiliary: Within the central liver, attention is directed to the region of the liver adjacent the LEFT portal vein and IVC where on comparison PET-CT scan, there was a small focus of hypermetabolic activity. There is no lesion identified at this site on the noncontrast T1 and T2 weighted series or the post-contrast T1 weighted series. There is an adjacent hemangioma on image 51/1001. More distant hemangioma in the inferior RIGHT hepatic lobe on image 72/101. No lymph nodes are noted in the gastrohepatic ligament. No lymph nodes noted in the porta hepatis. Pancreas: Normal pancreatic parenchymal intensity. No ductal dilatation or inflammation. Spleen: Normal spleen. Adrenals/urinary tract: Adrenal glands and kidneys are normal. Stomach/Bowel: Stomach and limited of the small bowel is unremarkable Vascular/Lymphatic: Abdominal aortic normal caliber. No retroperitoneal periportal lymphadenopathy. Musculoskeletal: No aggressive osseous lesion IMPRESSION: 1. No hepatic lesion identified to correspond to the small focus of hypermetabolic activity on comparison PET-CT scan. PET FDG activity remains indeterminate and may represent benign vascular phenomena. 2. Two benign hemangioma are noted in the RIGHT hepatic lobe. 3. No evidence metastatic adenopathy or  hepatic metastasis. Electronically Signed   By: Suzy Bouchard M.D.   On: 04/21/2018 10:04   Nm Pet Image Initial (pi) Skull Base To Thigh  Result Date: 04/11/2018 CLINICAL DATA:  Initial treatment strategy for recently diagnosed midesophageal cancer. Current smoker. EXAM: NUCLEAR MEDICINE PET SKULL BASE TO THIGH TECHNIQUE: 6.0 mCi F-18 FDG was injected intravenously. Full-ring PET imaging was performed from the skull base to thigh after the radiotracer. CT data was obtained and used for attenuation correction and anatomic localization. Fasting blood glucose: 103 mg/dl COMPARISON:  Abdominopelvic CT 08/16/2009. FINDINGS: Mediastinal blood pool activity: SUV max 1.7 NECK: No hypermetabolic cervical lymph nodes are identified.There are no lesions of the pharyngeal mucosal space. Incidental CT findings: none CHEST: There is intense hypermetabolic activity in the midesophagus, just distal to the carina (SUV max 13.0). There is mild corresponding esophageal wall thickening in this area, but no well-defined mass. There are no hypermetabolic mediastinal, hilar or axillary lymph nodes. There is no suspicious pulmonary activity. Incidental CT findings: Mild emphysema with asymmetric blebs at the right apex. Tiny subpleural nodules in the right upper lobe on images 17/8 and 31/8. No suspicious pulmonary nodules severe coronary artery atherosclerosis with lesser involvement of the aorta and great vessels. ABDOMEN/PELVIS: There is focal hypermetabolic activity centrally in the liver adjacent to the intrahepatic portion of the main portal vein. This has  an SUV max of 4.4. No clear corresponding hepatic lesion is seen in this area on the CT images. This is indeterminate for nodal versus hepatic uptake. No other abnormal activity is seen within the liver, pancreas or adrenal glands. There is no other hypermetabolic nodal activity in the abdomen or pelvis. Incidental CT findings: Aortic and branch vessel atherosclerosis.  Separate from the uptake in the porta hepatis is a stable 1.4 cm low-density lesion in the right hepatic lobe lateral to the IVC (image 116/4), probably a hemangioma based on previous CT. There is an additional low-density lesion more posteriorly in the right hepatic lobe on image 125/4 which is also stable. SKELETON: There is no hypermetabolic activity to suggest osseous metastatic disease. Incidental CT findings: Stable sclerotic lesions in the left iliac bone on images 154 and 161 of series 4. IMPRESSION: 1. Focally intense hypermetabolic activity associated with the known mass involving midesophagus. No thoracic nodal uptake identified. 2. Small focus of activity in the porta hepatis, indeterminate for a hepatic lesion versus a lymph node, but suspicious for metastatic disease. MRI may be helpful for further evaluation. Separate hepatic lesions are unchanged from 2011 CT. 3. Tiny right upper lobe pulmonary nodules, likely benign. Electronically Signed   By: Richardean Sale M.D.   On: 04/11/2018 14:56     I have independently reviewed the above radiology studies  and reviewed the findings with the patient.   Recent Lab Findings: Lab Results  Component Value Date   WBC 3.4 (L) 01/04/2018   HGB 14.5 01/04/2018   HCT 44.2 01/04/2018   PLT 293 01/04/2018   GLUCOSE 96 01/04/2018   LDLDIRECT 112 (H) 11/26/2009   ALT 13 01/04/2018   AST 18 01/04/2018   NA 138 01/04/2018   K 3.8 01/04/2018   CL 101 01/04/2018   CREATININE 0.80 04/21/2018   BUN 7 01/04/2018   CO2 27 01/04/2018   TSH 0.925 11/26/2009                Cancer Staging Primary cancer of middle third of esophagus ([place]) Staging form: Esophagus - Squamous Cell Carcinoma, AJCC 8th Edition - Clinical: Stage II (cT2, cN0, cM0, L: Middle) - Signed by Aaron Pier, MD on 03/23/2018   Assessment / Plan:    We will make arrangements for cardiology clearance prior to esophagectomy, Social worker referral to assist with  social issues and making arrangements for living conditions following surgery Tentatively plan surgical resection in 10 to 14 days depending on how long cardiology evaluation takes.  [name] MD      [address] [place] [address] Office [phone]   Beeper [phone]  04/27/2018 9:23 AM

## 2018-04-27 NOTE — Telephone Encounter (Signed)
Scheduled appt per 1/15 los - sent reminder letter in the mail with appt date and time   

## 2018-05-02 ENCOUNTER — Ambulatory Visit: Payer: Medicaid Other | Admitting: Cardiology

## 2018-05-03 ENCOUNTER — Ambulatory Visit: Payer: Medicaid Other | Admitting: Cardiothoracic Surgery

## 2018-05-04 ENCOUNTER — Ambulatory Visit: Payer: Medicaid Other | Admitting: Interventional Cardiology

## 2018-05-04 ENCOUNTER — Ambulatory Visit (INDEPENDENT_AMBULATORY_CARE_PROVIDER_SITE_OTHER): Payer: Medicaid Other

## 2018-05-04 ENCOUNTER — Encounter: Payer: Self-pay | Admitting: Interventional Cardiology

## 2018-05-04 VITALS — BP 120/76 | HR 107 | Ht 69.0 in | Wt 119.6 lb

## 2018-05-04 DIAGNOSIS — I2584 Coronary atherosclerosis due to calcified coronary lesion: Secondary | ICD-10-CM

## 2018-05-04 DIAGNOSIS — I2510 Atherosclerotic heart disease of native coronary artery without angina pectoris: Secondary | ICD-10-CM | POA: Diagnosis not present

## 2018-05-04 DIAGNOSIS — R0789 Other chest pain: Secondary | ICD-10-CM

## 2018-05-04 DIAGNOSIS — Z01810 Encounter for preprocedural cardiovascular examination: Secondary | ICD-10-CM

## 2018-05-04 DIAGNOSIS — Z720 Tobacco use: Secondary | ICD-10-CM | POA: Diagnosis not present

## 2018-05-04 LAB — EXERCISE TOLERANCE TEST
Estimated workload: 7 METS
Exercise duration (min): 5 min
Exercise duration (sec): 0 s
MPHR: 160 {beats}/min
Peak HR: 162 {beats}/min
Percent HR: 101 %
RPE: 15
Rest HR: 92 {beats}/min

## 2018-05-04 NOTE — Patient Instructions (Signed)
Medication Instructions:  Your physician recommends that you continue on your current medications as directed. Please refer to the Current Medication list given to you today.  If you need a refill on your cardiac medications before your next appointment, please call your pharmacy.   Lab work: None Ordered  If you have labs (blood work) drawn today and your tests are completely normal, you will receive your results only by: Marland Kitchen MyChart Message (if you have MyChart) OR . A paper copy in the mail If you have any lab test that is abnormal or we need to change your treatment, we will call you to review the results.  Testing/Procedures: Your physician has requested that you have an exercise tolerance test. For further information please visit [email or web address]. Please also follow instruction sheet, as given.   Follow-Up: . Based on Test Results  Any Other Special Instructions Will Be Listed Below (If Applicable).

## 2018-05-04 NOTE — Progress Notes (Signed)
Cardiology Office Note   Date:  05/04/2018   ID:  Aaron Wang, DOB 08-31-1955, MRN [id number]  PCP:  Nolene Ebbs, MD    No chief complaint on file.  Coronary artery calcification  Wt Readings from Last 3 Encounters:  05/04/18 119 lb 9.6 oz (54.3 kg)  04/27/18 120 lb (54.4 kg)  04/26/18 119 lb 8 oz (54.2 kg)       History of Present Illness: Aaron Wang is a 63 y.o. male who is being seen today for the evaluation of coronary artery calcification at the request of Nolene Ebbs, MD.  [name] requires esophagectomy for a mass.  He was noted to have coronary calcification on his CT scan.  Denies :  Dizziness. Leg edema. Nitroglycerin use. Orthopnea. Palpitations. Paroxysmal nocturnal dyspnea. Shortness of breath. Syncope.   He has had some chest pain with swallowing.  He remains active.  He has used tramadol for the esophageal pain.  He feels that ibuprofen actually works better.  He does not routinely have pain with activity in his.   Past Medical History:  Diagnosis Date  . Back pain   . Carpal tunnel syndrome   . Esophageal cancer ([place])   . GERD (gastroesophageal reflux disease)   . Hypertension   . Injury    tore ligaments left knee    Past Surgical History:  Procedure Laterality Date  . BACK SURGERY    . KNEE SURGERY    . WRIST SURGERY     carpal tunnel     Current Outpatient Medications  Medication Sig Dispense Refill  . ibuprofen (ADVIL,MOTRIN) 800 MG tablet Take 1 tablet (800 mg total) by mouth 3 (three) times daily. (Patient taking differently: Take 800 mg by mouth every 8 (eight) hours as needed for mild pain. ) 21 tablet 0  . lisinopril-hydrochlorothiazide (PRINZIDE) 20-12.5 MG per tablet Take 1 tablet by mouth daily. 30 tablet 1  . traMADol (ULTRAM) 50 MG tablet Take 1 tablet (50 mg total) by mouth every 12 (twelve) hours as needed. 20 tablet 0   No current facility-administered medications for this visit.     Allergies:   Patient has no  known allergies.    Social History:  The patient  reports that he has been smoking. He has been smoking about 0.50 packs per day. He has never used smokeless tobacco. He reports current alcohol use. He reports that he does not use drugs.   Family History:  The patient's family history includes Heart attack in his mother.    ROS:  Please see the history of present illness.   Otherwise, review of systems are positive for chest pain with swwallowing.   All other systems are reviewed and negative.    PHYSICAL EXAM: VS:  BP 120/76   Pulse (!) 107   Ht 5\' 9"  (1.753 m)   Wt 119 lb 9.6 oz (54.3 kg)   SpO2 98%   BMI 17.66 kg/m  , BMI Body mass index is 17.66 kg/m. GEN: Well nourished, well developed, in no acute distress , thin HEENT: normal  Neck: no JVD, carotid bruits, or masses Cardiac: RRR; no murmur, no rubs, or gallops,no edema  Respiratory:  clear to auscultation bilaterally, normal work of breathing GI: soft, nontender, nondistended, + BS MS: no deformity or atrophy ;  Skin: warm and dry, no rash Neuro:  Strength and sensation are intact Psych: euthymic mood, full affect   EKG:   The ekg ordered in 9/19 demonstrates NSR,  LVH   Recent Labs: 01/04/2018: ALT 13; BUN 7; Hemoglobin 14.5; Platelets 293; Potassium 3.8; Sodium 138 04/21/2018: Creatinine, Ser 0.80   Lipid Panel    Component Value Date/Time   LDLDIRECT 112 (H) 11/26/2009 2032     Other studies Reviewed: Additional studies/ records that were reviewed today with results demonstrating: labs, PET scan reviewed.   ASSESSMENT AND PLAN:  1. Preop cardiovascular testing/Coronary calcification: Noted on CT scan of chest.  He does have some atypical chest pain.  He remains active.  Much of his activity occurs without having any chest discomfort.  On occasion, he will have some discomfort that he thinks is related to his esophagus.  He certainly has pain with swallowing that is most likely related to his esophagus.   Given his risk factors for coronary artery disease, we will plan for exercise treadmill test.  We considered coronary CT but there is a significant wait time with this test.  Given that he needs his esophageal surgery, I do want him to wait unnecessarily.  We will try to do a treadmill test today.   2. Tobacco abuse: He needs to stop smoking. 3. Given that he has significant coronary calcium, would consider statin once his esophageal surgery is complete.   Current medicines are reviewed at length with the patient today.  The patient concerns regarding his medicines were addressed.  The following changes have been made:  No change  Labs/ tests ordered today include:  No orders of the defined types were placed in this encounter.   Recommend 150 minutes/week of aerobic exercise Low fat, low carb, high fiber diet recommended  Disposition:   FU in 6 months  ETT showed no ischemia.   Signed, Larae Grooms, MD  05/04/2018 3:21 PM    [facility] [place], [place], [place]  [address] Phone: [phone]; Fax: [phone]

## 2018-05-05 ENCOUNTER — Ambulatory Visit: Payer: Medicaid Other | Admitting: Cardiothoracic Surgery

## 2018-05-05 ENCOUNTER — Telehealth (HOSPITAL_COMMUNITY): Payer: Self-pay | Admitting: Licensed Clinical Social Worker

## 2018-05-05 DIAGNOSIS — I2510 Atherosclerotic heart disease of native coronary artery without angina pectoris: Secondary | ICD-10-CM | POA: Insufficient documentation

## 2018-05-05 DIAGNOSIS — I2584 Coronary atherosclerosis due to calcified coronary lesion: Secondary | ICD-10-CM

## 2018-05-05 NOTE — Progress Notes (Deleted)
[address]       [place] [address]             [phone]                    [name] [place] Medical Record #[id number] Date of Birth: 05/28/1955  Referring: Ladell Pier, MD Primary Care: Nolene Ebbs, MD Primary Cardiologist: No primary care provider on file.  Chief Complaint:    No chief complaint on file.   History of Present Illness:    Aaron Wang 63 y.o. male is seen in the office  today for squamous cell carcinoma of the esophagus.  Several months ago the patient noted increasing episodes of painful swallowing.  He underwent upper GI endoscopy PET scan and EUS. He notes that he has lost approximately 6 pounds over the last 6 months.  He is a long-term smoker and is continuing to smoke.  He has significantly cut down on his smoking now just a few cigarettes a day.  MRI has been completed to rule out the possibility of liver metastasis, and there is no evidence of liver metastasis.  Patient is still waiting for cardiology appointment.  His concerns today are about a place to live after surgery, he notes that for the past 3 years he has been [facility] in the [id number] and staying in room [address]  Although he has no direct cardiac history of coronary known coronary artery disease he has extensive calcification of his coronary arteries noted on CT scan.    Current Activity/ Functional Status:  Patient is independent with mobility/ambulation, transfers, ADL's, IADL's.   Zubrod Score: At the time of surgery this patient's most appropriate activity status/level should be described as: []     0    Normal activity, no symptoms [x]     1    Restricted in physical strenuous activity but ambulatory, able to do out light work []     2    Ambulatory and capable of self care, unable to do work activities, up and about               >50 % of waking hours                              []     3    Only limited self care, in bed greater than  50% of waking hours []     4    Completely disabled, no self care, confined to bed or chair []     5    Moribund   Past Medical History:  Diagnosis Date  . Back pain   . Carpal tunnel syndrome   . Esophageal cancer ([place])   . GERD (gastroesophageal reflux disease)   . Hypertension   . Injury    tore ligaments left knee    Past Surgical History:  Procedure Laterality Date  . BACK SURGERY    . KNEE SURGERY    . WRIST SURGERY     carpal tunnel    Family History  Problem Relation Age of Onset  . Heart attack Mother   . Colon cancer Neg Hx      Social History   Tobacco Use  Smoking Status Current Every Day Smoker  . Packs/day: 0.50  Smokeless Tobacco Never Used    Social History   Substance and Sexual Activity  Alcohol Use Yes  .  Alcohol/week: 0.0 standard drinks   Comment: occasional; weekends     No Known Allergies  Current Outpatient Medications  Medication Sig Dispense Refill  . ibuprofen (ADVIL,MOTRIN) 800 MG tablet Take 1 tablet (800 mg total) by mouth 3 (three) times daily. (Patient taking differently: Take 800 mg by mouth every 8 (eight) hours as needed for mild pain. ) 21 tablet 0  . lisinopril-hydrochlorothiazide (PRINZIDE) 20-12.5 MG per tablet Take 1 tablet by mouth daily. 30 tablet 1  . traMADol (ULTRAM) 50 MG tablet Take 1 tablet (50 mg total) by mouth every 12 (twelve) hours as needed. 20 tablet 0   No current facility-administered medications for this visit.     Pertinent items are noted in HPI.   Review of Systems:     Cardiac Review of Systems: [Y] = yes  or   [ N ] = no   Chest Pain [ N]  Resting SOB [ N] Exertional SOB  Aqua.Slicker ]  Vertell Limber Aqua.Slicker ]   Pedal Edema Aqua.Slicker ]    Palpitations Aqua.Slicker ] Syncope  [N]   Presyncope [ N]   General Review of Systems: [Y] = yes [  ]=no Constitional: recent weight change Blue.Reese  ];  Wt loss over the last 3 months [ 6  ] anorexia [  ]; fatigue [ 6 ]; nausea [  ]; night sweats [  ]; fever [  ]; or chills [  ];           Eye  : blurred vision [  ]; diplopia [   ]; vision changes [  ];  Amaurosis fugax[  ]; Resp: cough [  ];  wheezing[  ];  hemoptysis[  ]; shortness of breath[  ]; paroxysmal nocturnal dyspnea[  ]; dyspnea on exertion[  ]; or orthopnea[  ];  GI:  gallstones[  ], vomiting[  ];  dysphagia[  ]; melena[  ];  hematochezia [  ]; heartburn[  ];   Hx of  Colonoscopy[  ]; GU: kidney stones [  ]; hematuria[  ];   dysuria [  ];  nocturia[  ];  history of     obstruction [  ]; urinary frequency [  ]             Skin: rash, swelling[  ];, hair loss[  ];  peripheral edema[  ];  or itching[  ]; Musculosketetal: myalgias[  ];  joint swelling[  ];  joint erythema[  ];  joint pain[  ];  back pain[  ];  Heme/Lymph: bruising[  ];  bleeding[  ];  anemia[  ];  Neuro: TIA[  ];  headaches[  ];  stroke[  ];  vertigo[  ];  seizures[  ];   paresthesias[  ];  difficulty walking[  ];  Psych:depression[  ]; anxiety[  ];  Endocrine: diabetes[  ];  thyroid dysfunction[  ];  Immunizations: Flu up to date [  ]; Pneumococcal up to date [  ];  Other:     PHYSICAL EXAMINATION: There were no vitals taken for this visit.  {physical exam:21449}  Diagnostic Studies & Laboratory data:     Recent Radiology Findings:   Mr Abdomen W Wo Contrast  Result Date: 04/21/2018 CLINICAL DATA:  Esophageal carcinoma. Suspicious lesion in the liver on FDG PET scan. MRI recommended for further characterization. EXAM: MRI ABDOMEN WITHOUT AND WITH CONTRAST TECHNIQUE: Multiplanar multisequence MR imaging of the abdomen was performed both before and after the administration of intravenous contrast. CONTRAST:  5 mL Gadavist COMPARISON:  PET-CT scan 04/11/2018 FINDINGS: Lower chest:  Lung bases are clear. Hepatobiliary: Within the central liver, attention is directed to the region of the liver adjacent the LEFT portal vein and IVC where on comparison PET-CT scan, there was a small focus of hypermetabolic activity. There is no lesion identified at this site on  the noncontrast T1 and T2 weighted series or the post-contrast T1 weighted series. There is an adjacent hemangioma on image 51/1001. More distant hemangioma in the inferior RIGHT hepatic lobe on image 72/101. No lymph nodes are noted in the gastrohepatic ligament. No lymph nodes noted in the porta hepatis. Pancreas: Normal pancreatic parenchymal intensity. No ductal dilatation or inflammation. Spleen: Normal spleen. Adrenals/urinary tract: Adrenal glands and kidneys are normal. Stomach/Bowel: Stomach and limited of the small bowel is unremarkable Vascular/Lymphatic: Abdominal aortic normal caliber. No retroperitoneal periportal lymphadenopathy. Musculoskeletal: No aggressive osseous lesion IMPRESSION: 1. No hepatic lesion identified to correspond to the small focus of hypermetabolic activity on comparison PET-CT scan. PET FDG activity remains indeterminate and may represent benign vascular phenomena. 2. Two benign hemangioma are noted in the RIGHT hepatic lobe. 3. No evidence metastatic adenopathy or hepatic metastasis. Electronically Signed   By: Suzy Bouchard M.D.   On: 04/21/2018 10:04   Nm Pet Image Initial (pi) Skull Base To Thigh  Result Date: 04/11/2018 CLINICAL DATA:  Initial treatment strategy for recently diagnosed midesophageal cancer. Current smoker. EXAM: NUCLEAR MEDICINE PET SKULL BASE TO THIGH TECHNIQUE: 6.0 mCi F-18 FDG was injected intravenously. Full-ring PET imaging was performed from the skull base to thigh after the radiotracer. CT data was obtained and used for attenuation correction and anatomic localization. Fasting blood glucose: 103 mg/dl COMPARISON:  Abdominopelvic CT 08/16/2009. FINDINGS: Mediastinal blood pool activity: SUV max 1.7 NECK: No hypermetabolic cervical lymph nodes are identified.There are no lesions of the pharyngeal mucosal space. Incidental CT findings: none CHEST: There is intense hypermetabolic activity in the midesophagus, just distal to the carina (SUV max  13.0). There is mild corresponding esophageal wall thickening in this area, but no well-defined mass. There are no hypermetabolic mediastinal, hilar or axillary lymph nodes. There is no suspicious pulmonary activity. Incidental CT findings: Mild emphysema with asymmetric blebs at the right apex. Tiny subpleural nodules in the right upper lobe on images 17/8 and 31/8. No suspicious pulmonary nodules severe coronary artery atherosclerosis with lesser involvement of the aorta and great vessels. ABDOMEN/PELVIS: There is focal hypermetabolic activity centrally in the liver adjacent to the intrahepatic portion of the main portal vein. This has an SUV max of 4.4. No clear corresponding hepatic lesion is seen in this area on the CT images. This is indeterminate for nodal versus hepatic uptake. No other abnormal activity is seen within the liver, pancreas or adrenal glands. There is no other hypermetabolic nodal activity in the abdomen or pelvis. Incidental CT findings: Aortic and branch vessel atherosclerosis. Separate from the uptake in the porta hepatis is a stable 1.4 cm low-density lesion in the right hepatic lobe lateral to the IVC (image 116/4), probably a hemangioma based on previous CT. There is an additional low-density lesion more posteriorly in the right hepatic lobe on image 125/4 which is also stable. SKELETON: There is no hypermetabolic activity to suggest osseous metastatic disease. Incidental CT findings: Stable sclerotic lesions in the left iliac bone on images 154 and 161 of series 4. IMPRESSION: 1. Focally intense hypermetabolic activity associated with the known mass involving midesophagus. No thoracic nodal uptake identified. 2.  Small focus of activity in the porta hepatis, indeterminate for a hepatic lesion versus a lymph node, but suspicious for metastatic disease. MRI may be helpful for further evaluation. Separate hepatic lesions are unchanged from 2011 CT. 3. Tiny right upper lobe pulmonary  nodules, likely benign. Electronically Signed   By: Richardean Sale M.D.   On: 04/11/2018 14:56     I have independently reviewed the above radiology studies  and reviewed the findings with the patient.   Recent Lab Findings: Lab Results  Component Value Date   WBC 3.4 (L) 01/04/2018   HGB 14.5 01/04/2018   HCT 44.2 01/04/2018   PLT 293 01/04/2018   GLUCOSE 96 01/04/2018   LDLDIRECT 112 (H) 11/26/2009   ALT 13 01/04/2018   AST 18 01/04/2018   NA 138 01/04/2018   K 3.8 01/04/2018   CL 101 01/04/2018   CREATININE 0.80 04/21/2018   BUN 7 01/04/2018   CO2 27 01/04/2018   TSH 0.925 11/26/2009                Cancer Staging Primary cancer of middle third of esophagus ([facility]) Staging form: Esophagus - Squamous Cell Carcinoma, AJCC 8th Edition - Clinical: Stage II (cT2, cN0, cM0, L: Middle) - Signed by Ladell Pier, MD on 03/23/2018    Blood pressure demonstrated a hypertensive response to exercise.  There was no ST segment deviation noted during stress.  No T wave inversion was noted during stress.  No evidence of ischemia.  No symptoms of ischemia with exertion.   After esophagus surgery, would consider adding statin due to coronary calcium.  No further cardiac testing panned before surgery.    Stress Findings   ECG Baseline ECG exhibits normal sinus rhythm..  Stress Findings The patient exercised following the Bruce protocol.  The patient reported no symptoms during the stress test. The patient experienced no angina during the stress test.      Assessment / Plan:    Social worker referral to assist with social issues and making arrangements for living conditions following surgery   [name] MD      [address] [place] [address] Office [phone]   Beeper [phone]  05/05/2018 7:44 AM

## 2018-05-05 NOTE — Telephone Encounter (Signed)
CSW met with pt at [place] office to discuss potential options for patient following upcoming surgery on esophagus.   Pt reports that he is currently living at [facility] and only pays $600/month to stay there- feels as this is stable living situation for him at this time and likely the cheapest option for him as this includes his utilities and cable.  Pt reports little supports available- had a significant other who died in 04-14-23 and only other support in this area is a Psychiatrist who lives in [place] with her 6 children.  All of pts other family lives in [place] or [place] but they are estranged and he hasn't spoken to them in years.  Pt is nervous to be without supervision and support following hospital stay.  Pt is unsure if he could stay with his stepdaughter following surgery but CSW encouraged him to evaluate this as a possible option in case he does not qualify for other assistance.  CSW discuss SNF as another possible option but made it clear to patient that his qualifying for this option would be completely dependent on pt medical status follow surgery- if pt does not have intensive medical needs or physical deficits than he would not meet criteria for SNF stay.   CSW also discussed possible option for home health if pt qualifies as pt was told he might require a PEG tube following surgery.  Pt expressed understanding of possible options- hopeful he might qualify for some kind of SNF as he doesn't want to inconvenience his stepdaughter but understands there are a lot of potential barriers to this option so he will evaluate if staying for a week with his stepdaughter might be an option.  CSW will continue to follow and assist as needed  Jorge Ny, LCSW Clinical Social Worker [facility] [phone]

## 2018-05-10 ENCOUNTER — Ambulatory Visit: Payer: Medicaid Other | Admitting: Cardiothoracic Surgery

## 2018-05-10 VITALS — BP 145/77 | HR 90 | Resp 20 | Ht 69.0 in | Wt 119.0 lb

## 2018-05-10 DIAGNOSIS — C154 Malignant neoplasm of middle third of esophagus: Secondary | ICD-10-CM | POA: Diagnosis not present

## 2018-05-10 NOTE — Progress Notes (Signed)
Preparation for your surgery: Bowel Prep  You will need to undergo a bowel prep starting 2 days prior to your surgery.  You will start clear liquids mid-day Saturday 2/15 and all day Sunday 2/16.  Nothing to eat to drink after Sunday 2/16 at midnight.  Clear liquid diet:  This includes broths (vegetable, chicken or beef), juices (grape, apple, or cranberry), Jell-O, popsicles, coffee or tea (without milk or cream) and Gatorade. Hard candy and gum are OK. Avoid alcohol and all solid food. You should take in at LEAST 10 cups of fluid this day. In addition to the clear liquid diet (that you will remain on the entire day).  You will also need to drink ONE 10 ounce bottle of Magnesium Citrate on Sunday 2/16. It is best if you can drink it mid-morning, around 10 am (you can buy this over the counter at your neighborhood pharmacy).

## 2018-05-11 NOTE — Progress Notes (Signed)
[address]       [place], [address]             [phone]                    Rhen Lawther [facility] Medical Record #[id number] Date of Birth: 12/23/1955  Referring: Aaron Pier, MD Primary Care: Aaron Ebbs, MD Primary Cardiologist: No primary care provider on file.  Chief Complaint:    Chief Complaint  Patient presents with  . Esophageal Cancer    f/u after cardiac clearence 05/04/18    History of Present Illness:    Aaron Wang 63 y.o. male is followed in the office for squamous cell carcinoma of the esophagus.  Several months ago the patient noted increasing episodes of painful swallowing.  He underwent upper GI endoscopy PET scan and EUS. He notes that he has lost approximately 6 pounds over the last 6 months.  He is a long-term smoker and is continuing to smoke.  He has significantly cut down on his smoking now just a few cigarettes a day.  MRI has been completed to rule out the possibility of liver metastasis, and there is no evidence of liver metastasis.    His concerns today are about a place to live after surgery, he notes that for the past 3 years he has been living in the [facility] [id number] and staying in room 157.  He is been seen by social work to assist in arranging for home care after discharge.     Current Activity/ Functional Status:  Patient is independent with mobility/ambulation, transfers, ADL's, IADL's.   Zubrod Score: At the time of surgery this patient's most appropriate activity status/level should be described as: []     0    Normal activity, no symptoms [x]     1    Restricted in physical strenuous activity but ambulatory, able to do out light work []     2    Ambulatory and capable of self care, unable to do work activities, up and about               >50 % of waking hours                              []     3    Only limited self care, in bed greater than 50% of waking hours []     4    Completely disabled,  no self care, confined to bed or chair []     5    Moribund   Past Medical History:  Diagnosis Date  . Back pain   . Carpal tunnel syndrome   . Esophageal cancer ([place])   . GERD (gastroesophageal reflux disease)   . Hypertension   . Injury    tore ligaments left knee    Past Surgical History:  Procedure Laterality Date  . BACK SURGERY    . KNEE SURGERY    . WRIST SURGERY     carpal tunnel    Family History  Problem Relation Age of Onset  . Heart attack Mother   . Colon cancer Neg Hx      Social History   Tobacco Use  Smoking Status Current Every Day Smoker  . Packs/day: 0.50  Smokeless Tobacco Never Used    Social History   Substance and Sexual Activity  Alcohol Use Yes  .  Alcohol/week: 0.0 standard drinks   Comment: occasional; weekends     No Known Allergies  Current Outpatient Medications  Medication Sig Dispense Refill  . ibuprofen (ADVIL,MOTRIN) 800 MG tablet Take 1 tablet (800 mg total) by mouth 3 (three) times daily. (Patient taking differently: Take 800 mg by mouth every 8 (eight) hours as needed for mild pain. ) 21 tablet 0  . lisinopril-hydrochlorothiazide (PRINZIDE) 20-12.5 MG per tablet Take 1 tablet by mouth daily. 30 tablet 1  . traMADol (ULTRAM) 50 MG tablet Take 1 tablet (50 mg total) by mouth every 12 (twelve) hours as needed. 20 tablet 0   No current facility-administered medications for this visit.     Pertinent items are noted in HPI.   Review of Systems:     Cardiac Review of Systems: [Y] = yes  or   [ N ] = no   Chest Pain [ n  Resting SOB [ n Exertional SOB  [n ]  Orthopnea [n ]   Pedal Edema [n   Palpitations [n ] Syncope  [n   Presyncope [n]   General Review of Systems: [Y] = yes [  ]=no Constitional: recent weight change Blue.Reese  ];  Wt loss over the last 3 months [ 6  ] anorexia [  ]; fatigue [ 6 ]; nausea [  ]; night sweats [  ]; fever [  ]; or chills [  ];           Eye : blurred vision [  ]; diplopia [   ]; vision changes [  ];   Amaurosis fugax[  ]; Resp: cough [  ];  wheezing[  ];  hemoptysis[  ]; shortness of breath[  ]; paroxysmal nocturnal dyspnea[  ]; dyspnea on exertion[  ]; or orthopnea[  ];  GI:  gallstones[  ], vomiting[  ];  dysphagia[  ]; melena[  ];  hematochezia [  ]; heartburn[  ];   Hx of  Colonoscopy[  ]; GU: kidney stones [  ]; hematuria[  ];   dysuria [  ];  nocturia[  ];  history of     obstruction [  ]; urinary frequency [  ]             Skin: rash, swelling[  ];, hair loss[  ];  peripheral edema[  ];  or itching[  ]; Musculosketetal: myalgias[  ];  joint swelling[  ];  joint erythema[  ];  joint pain[  ];  back pain[  ];  Heme/Lymph: bruising[  ];  bleeding[  ];  anemia[  ];  Neuro: TIA[  ];  headaches[  ];  stroke[  ];  vertigo[  ];  seizures[  ];   paresthesias[  ];  difficulty walking[  ];  Psych:depression[  ]; anxiety[  ];  Endocrine: diabetes[  ];  thyroid dysfunction[  ];  Immunizations: Flu up to date [  ]; Pneumococcal up to date [  ];  Other:     PHYSICAL EXAMINATION: BP (!) 145/77   Pulse 90   Resp 20   Ht 5\' 9"  (1.753 m)   Wt 119 lb (54 kg)   SpO2 100% Comment: RA  BMI 17.57 kg/m   General appearance: alert, cooperative and cachectic Head: Normocephalic, without obvious abnormality, atraumatic Neck: no adenopathy, no carotid bruit, no JVD, supple, symmetrical, trachea midline and thyroid not enlarged, symmetric, no tenderness/mass/nodules Lymph nodes: Cervical, supraclavicular, and axillary nodes normal. Resp: clear to auscultation bilaterally Back: symmetric, no  curvature. ROM normal. No CVA tenderness. Cardio: regular rate and rhythm, S1, S2 normal, no murmur, click, rub or gallop GI: soft, non-tender; bowel sounds normal; no masses,  no organomegaly Extremities: extremities normal, atraumatic, no cyanosis or edema Neurologic: Grossly normal  Diagnostic Studies & Laboratory data:     Recent Radiology Findings:   Mr Abdomen W Wo Contrast  Result Date:  04/21/2018 CLINICAL DATA:  Esophageal carcinoma. Suspicious lesion in the liver on FDG PET scan. MRI recommended for further characterization. EXAM: MRI ABDOMEN WITHOUT AND WITH CONTRAST TECHNIQUE: Multiplanar multisequence MR imaging of the abdomen was performed both before and after the administration of intravenous contrast. CONTRAST:  5 mL Gadavist COMPARISON:  PET-CT scan 04/11/2018 FINDINGS: Lower chest:  Lung bases are clear. Hepatobiliary: Within the central liver, attention is directed to the region of the liver adjacent the LEFT portal vein and IVC where on comparison PET-CT scan, there was a small focus of hypermetabolic activity. There is no lesion identified at this site on the noncontrast T1 and T2 weighted series or the post-contrast T1 weighted series. There is an adjacent hemangioma on image 51/1001. More distant hemangioma in the inferior RIGHT hepatic lobe on image 72/101. No lymph nodes are noted in the gastrohepatic ligament. No lymph nodes noted in the porta hepatis. Pancreas: Normal pancreatic parenchymal intensity. No ductal dilatation or inflammation. Spleen: Normal spleen. Adrenals/urinary tract: Adrenal glands and kidneys are normal. Stomach/Bowel: Stomach and limited of the small bowel is unremarkable Vascular/Lymphatic: Abdominal aortic normal caliber. No retroperitoneal periportal lymphadenopathy. Musculoskeletal: No aggressive osseous lesion IMPRESSION: 1. No hepatic lesion identified to correspond to the small focus of hypermetabolic activity on comparison PET-CT scan. PET FDG activity remains indeterminate and may represent benign vascular phenomena. 2. Two benign hemangioma are noted in the RIGHT hepatic lobe. 3. No evidence metastatic adenopathy or hepatic metastasis. Electronically Signed   By: Suzy Bouchard M.D.   On: 04/21/2018 10:04   Nm Pet Image Initial (pi) Skull Base To Thigh  Result Date: 04/11/2018 CLINICAL DATA:  Initial treatment strategy for recently diagnosed  midesophageal cancer. Current smoker. EXAM: NUCLEAR MEDICINE PET SKULL BASE TO THIGH TECHNIQUE: 6.0 mCi F-18 FDG was injected intravenously. Full-ring PET imaging was performed from the skull base to thigh after the radiotracer. CT data was obtained and used for attenuation correction and anatomic localization. Fasting blood glucose: 103 mg/dl COMPARISON:  Abdominopelvic CT 08/16/2009. FINDINGS: Mediastinal blood pool activity: SUV max 1.7 NECK: No hypermetabolic cervical lymph nodes are identified.There are no lesions of the pharyngeal mucosal space. Incidental CT findings: none CHEST: There is intense hypermetabolic activity in the midesophagus, just distal to the carina (SUV max 13.0). There is mild corresponding esophageal wall thickening in this area, but no well-defined mass. There are no hypermetabolic mediastinal, hilar or axillary lymph nodes. There is no suspicious pulmonary activity. Incidental CT findings: Mild emphysema with asymmetric blebs at the right apex. Tiny subpleural nodules in the right upper lobe on images 17/8 and 31/8. No suspicious pulmonary nodules severe coronary artery atherosclerosis with lesser involvement of the aorta and great vessels. ABDOMEN/PELVIS: There is focal hypermetabolic activity centrally in the liver adjacent to the intrahepatic portion of the main portal vein. This has an SUV max of 4.4. No clear corresponding hepatic lesion is seen in this area on the CT images. This is indeterminate for nodal versus hepatic uptake. No other abnormal activity is seen within the liver, pancreas or adrenal glands. There is no other hypermetabolic nodal activity in the  abdomen or pelvis. Incidental CT findings: Aortic and branch vessel atherosclerosis. Separate from the uptake in the porta hepatis is a stable 1.4 cm low-density lesion in the right hepatic lobe lateral to the IVC (image 116/4), probably a hemangioma based on previous CT. There is an additional low-density lesion more  posteriorly in the right hepatic lobe on image 125/4 which is also stable. SKELETON: There is no hypermetabolic activity to suggest osseous metastatic disease. Incidental CT findings: Stable sclerotic lesions in the left iliac bone on images 154 and 161 of series 4. IMPRESSION: 1. Focally intense hypermetabolic activity associated with the known mass involving midesophagus. No thoracic nodal uptake identified. 2. Small focus of activity in the porta hepatis, indeterminate for a hepatic lesion versus a lymph node, but suspicious for metastatic disease. MRI may be helpful for further evaluation. Separate hepatic lesions are unchanged from 2011 CT. 3. Tiny right upper lobe pulmonary nodules, likely benign. Electronically Signed   By: Richardean Sale M.D.   On: 04/11/2018 14:56     I have independently reviewed the above radiology studies  and reviewed the findings with the patient.   Recent Lab Findings: Lab Results  Component Value Date   WBC 3.4 (L) 01/04/2018   HGB 14.5 01/04/2018   HCT 44.2 01/04/2018   PLT 293 01/04/2018   GLUCOSE 96 01/04/2018   LDLDIRECT 112 (H) 11/26/2009   ALT 13 01/04/2018   AST 18 01/04/2018   NA 138 01/04/2018   K 3.8 01/04/2018   CL 101 01/04/2018   CREATININE 0.80 04/21/2018   BUN 7 01/04/2018   CO2 27 01/04/2018   TSH 0.925 11/26/2009                Cancer Staging Primary cancer of middle third of esophagus ([place]) Staging form: Esophagus - Squamous Cell Carcinoma, AJCC 8th Edition - Clinical: Stage II (cT2, cN0, cM0, L: Middle) - Signed by Aaron Pier, MD on 03/23/2018    Blood pressure demonstrated a hypertensive response to exercise.  There was no ST segment deviation noted during stress.  No T wave inversion was noted during stress.  No evidence of ischemia.  No symptoms of ischemia with exertion.   After esophagus surgery, would consider adding statin due to coronary calcium.  No further cardiac testing panned before surgery.      Stress Findings   ECG Baseline ECG exhibits normal sinus rhythm..  Stress Findings The patient exercised following the Bruce protocol.  The patient reported no symptoms during the stress test. The patient experienced no angina during the stress test.      Assessment / Plan:   Discussed with the patient proceeding with total esophagectomy, possible transhiatal but with low threshold to proceed with abdominal right chest and left neck incisions.   I have had a detailed discussion with Aaron Wang regarding the magnitude of the surgical esophagectomy procedure as well as the risks, the expected benefits, and alternatives.  I quoted Aaron Wang 5% perioperative mortality rate and a complication rate as high as 40%.  We specifically discussed complications, which include, but were not limited to: recurrent nerve injury with possible permanent hoarseness, anastomotic leak, airway and great vessel injury, conduit ischemia, thoracic duct leak, the inability to complete the operation via a transhiatal approach requiring a right thoracotomy,  bleeding, need for blood transfusion and the potential need for ventilator support.  Aaron Wang has had questions answered is well informed and willing to proceed.  Plan to proceed on  Monday, February 17  Patient was seen by cardiology stress test was performed, patient cleared to proceed with esophagectomy from a cardiology standpoint Social worker referral to assist with social issues and making arrangements for living conditions following surgery   Aaron Isaac MD      [address] [place] [address] Office [phone]   Beeper [phone]  05/11/2018 5:47 PM

## 2018-05-12 ENCOUNTER — Other Ambulatory Visit: Payer: Self-pay | Admitting: *Deleted

## 2018-05-12 ENCOUNTER — Encounter: Payer: Self-pay | Admitting: *Deleted

## 2018-05-12 DIAGNOSIS — C159 Malignant neoplasm of esophagus, unspecified: Secondary | ICD-10-CM

## 2018-05-23 NOTE — Pre-Procedure Instructions (Signed)
Aaron Wang  05/23/2018     Your procedure is scheduled on Monday, February 17.  Report to [facility] Entrance A- Admission at 5:30 AM                Your surgery or procedure is scheduled for 7:30 A.M.   Call this number if you have problems the morning of surgery: [phone]  This is the number for the Pre- Surgical Desk.   Remember:  Do not eat or drink after midnight Sunday, February 16.    Take these medicines the morning of surgery with A SIP OF WATER: May take traMADol Aaron Wang) if needed.  STOP  (do not start) taking Aspirin, Aspirin Products (Goody Powder, Excedrin Migraine), Ibuprofen (Advil), Naproxen (Aleve), Vitamins and Herbal Products (ie Fish Oil).  Special instructions:  [place]- Preparing For Surgery  Before surgery, you can play an important role. Because skin is not sterile, your skin needs to be as free of germs as possible. You can reduce the number of germs on your skin by washing with CHG (chlorahexidine gluconate) Soap before surgery.  CHG is an antiseptic cleaner which kills germs and bonds with the skin to continue killing germs even after washing.    Oral Hygiene is also important to reduce your risk of infection.  Remember - BRUSH YOUR TEETH THE MORNING OF SURGERY WITH YOUR REGULAR TOOTHPASTE  Please do not use if you have an allergy to CHG or antibacterial soaps. If your skin becomes reddened/irritated stop using the CHG.  Do not shave (including legs and underarms) for at least 48 hours prior to first CHG shower. It is OK to shave your face.  Please follow these instructions carefully.   1. Shower the NIGHT BEFORE SURGERY and the MORNING OF SURGERY with CHG.   2. If you chose to wash your hair, wash your hair first as usual with your normal shampoo.  3. After you shampoo, wash your face and private area with the soap you use at home, then rinse your hair and body thoroughly to remove the shampoo and soap.  4. Use CHG as you would any other  liquid soap. You can apply CHG directly to the skin and wash gently with a scrungie or a clean washcloth.   5. Apply the CHG Soap to your body ONLY FROM THE NECK DOWN.  Do not use on open wounds or open sores. Avoid contact with your eyes, ears, mouth and genitals (private parts).  6. Wash thoroughly, paying special attention to the area where your surgery will be performed.  7. Thoroughly rinse your body with warm water from the neck down.  8. DO NOT shower/wash with your normal soap after using and rinsing off the CHG Soap.  9. Pat yourself dry with a CLEAN TOWEL.  10. Wear CLEAN PAJAMAS to bed the night before surgery, wear comfortable clothes the morning of surgery  11. Place CLEAN SHEETS on your bed the night of your first shower and DO NOT SLEEP WITH PETS.  Day of Surgery: Shower as written above              DO NOT wear lotions, powders, or perfumes, or deodorant. Do not apply any deodorants/lotions.  Please wear clean clothes to the hospital/surgery center.   Remember to brush your teeth WITH YOUR REGULAR TOOTHPASTE.  Do not wear jewelry, make-up or nail polish.  Do not shave 48 hours prior to surgery.  Men may shave face and neck.  Do not bring valuables to the hospital.  [facility] is not responsible for any belongings or valuables.  Contacts, dentures or bridgework may not be worn into surgery.  Leave your suitcase in the car.  After surgery it may be brought to your room.  For patients admitted to the hospital, discharge time will be determined by your treatment team.  Patients discharged the day of surgery will not be allowed to drive home.   Please read over the following fact sheets that you were given: Pain Booklet, Patient Instructions for Mupirocin Application, Incentive Spirometry, Surgical Site Infections.

## 2018-05-25 ENCOUNTER — Encounter (HOSPITAL_COMMUNITY)
Admission: RE | Admit: 2018-05-25 | Discharge: 2018-05-25 | Disposition: A | Discharge status: Home / Self Care | Payer: Medicaid Other | Source: Ambulatory Visit | Attending: Cardiothoracic Surgery | Admitting: Cardiothoracic Surgery

## 2018-05-25 ENCOUNTER — Other Ambulatory Visit: Payer: Self-pay

## 2018-05-25 ENCOUNTER — Encounter (HOSPITAL_COMMUNITY): Payer: Self-pay

## 2018-05-25 DIAGNOSIS — Z01818 Encounter for other preprocedural examination: Secondary | ICD-10-CM | POA: Diagnosis not present

## 2018-05-25 DIAGNOSIS — C159 Malignant neoplasm of esophagus, unspecified: Secondary | ICD-10-CM | POA: Diagnosis not present

## 2018-05-25 LAB — BLOOD GAS, ARTERIAL
Acid-base deficit: 0.8 mmol/L (ref 0.0–2.0)
Bicarbonate: 23 mmol/L (ref 20.0–28.0)
Drawn by: 4218011
FIO2: 21
O2 Saturation: 97.5 %
Patient temperature: 98.6
pCO2 arterial: 36.3 mmHg (ref 32.0–48.0)
pH, Arterial: 7.419 (ref 7.350–7.450)
pO2, Arterial: 101 mmHg (ref 83.0–108.0)

## 2018-05-25 LAB — PROTIME-INR
INR: 1.1
Prothrombin Time: 14.1 seconds (ref 11.4–15.2)

## 2018-05-25 LAB — COMPREHENSIVE METABOLIC PANEL
ALT: 14 U/L (ref 0–44)
AST: 18 U/L (ref 15–41)
Albumin: 4.3 g/dL (ref 3.5–5.0)
Alkaline Phosphatase: 65 U/L (ref 38–126)
Anion gap: 10 (ref 5–15)
BUN: 6 mg/dL (ref 6–20)
CO2: 23 mmol/L (ref 22–32)
Calcium: 9.1 mg/dL (ref 8.9–10.3)
Chloride: 105 mmol/L (ref 98–111)
Creatinine, Ser: 0.68 mg/dL (ref 0.61–1.24)
GFR calc Af Amer: >60 mL/min (ref >60)
GFR calc non Af Amer: >60 mL/min (ref >60)
Glucose, Bld: 88 mg/dL (ref 70–99)
Potassium: 4.1 mmol/L (ref 3.5–5.1)
Sodium: 138 mmol/L (ref 135–145)
Total Bilirubin: 0.4 mg/dL (ref 0.3–1.2)
Total Protein: 7 g/dL (ref 6.5–8.1)

## 2018-05-25 LAB — URINALYSIS, ROUTINE W REFLEX MICROSCOPIC
Bilirubin Urine: NEGATIVE
Glucose, UA: NEGATIVE mg/dL
Hgb urine dipstick: NEGATIVE
Ketones, ur: NEGATIVE mg/dL
Leukocytes,Ua: NEGATIVE
Nitrite: NEGATIVE
Protein, ur: NEGATIVE mg/dL
Specific Gravity, Urine: 1.014 (ref 1.005–1.030)
pH: 7 (ref 5.0–8.0)

## 2018-05-25 LAB — APTT: aPTT: 33 seconds (ref 24–36)

## 2018-05-25 LAB — TYPE AND SCREEN
ABO/RH(D): O POS
Antibody Screen: NEGATIVE

## 2018-05-25 LAB — CBC
HCT: 41.2 % (ref 39.0–52.0)
Hemoglobin: 13.5 g/dL (ref 13.0–17.0)
MCH: 30.5 pg (ref 26.0–34.0)
MCHC: 32.8 g/dL (ref 30.0–36.0)
MCV: 93 fL (ref 80.0–100.0)
Platelets: 273 10*3/uL (ref 150–400)
RBC: 4.43 MIL/uL (ref 4.22–5.81)
RDW: 13 % (ref 11.5–15.5)
WBC: 5.4 10*3/uL (ref 4.0–10.5)
nRBC: 0 % (ref 0.0–0.2)

## 2018-05-25 LAB — SURGICAL PCR SCREEN
MRSA, PCR: NEGATIVE
Staphylococcus aureus: NEGATIVE

## 2018-05-25 LAB — ABO/RH: ABO/RH(D): O POS

## 2018-05-25 NOTE — Progress Notes (Addendum)
PCP - Dr Nolene Ebbs  Cardiologist -   Chest x-ray - day of surgery EKG -   Stress Test - 05/04/2018  ECHO - 2011  Cardiac Cath - no  Sleep Study - noCPAP - no  LABS-05/25/2018- WNL ASA-  HA1C-na Fasting Blood Sugar - na Checks Blood Sugar __na___ times a day  Anesthesia-na  Pt denies having chest pain, sob, or fever at this time. All instructions explained to the pt, with a verbal understanding of the material. Pt agrees to go over the instructions while at home for a better understanding. The opportunity to ask questions was provided. Mr Redmon has colon prep that includes clears liquids nd xdrinking a bottle of Magnesium Citrate.  Patient has a clear understanding of prep.

## 2018-05-28 ENCOUNTER — Encounter (HOSPITAL_COMMUNITY): Payer: Self-pay | Admitting: Anesthesiology

## 2018-05-28 NOTE — Anesthesia Preprocedure Evaluation (Addendum)
Anesthesia Evaluation  Patient identified by MRN, date of birth, ID band Patient awake    Reviewed: Allergy & Precautions, NPO status , Patient's Chart, lab work & pertinent test results  Airway Mallampati: I  TM Distance: >3 FB Neck ROM: Full    Dental  (+) Teeth Intact, Dental Advisory Given   Pulmonary Current Smoker,    breath sounds clear to auscultation       Cardiovascular hypertension, Pt. on medications + CAD   Rhythm:Regular Rate:Normal     Neuro/Psych negative neurological ROS  negative psych ROS   GI/Hepatic Neg liver ROS, GERD  ,  Endo/Other  negative endocrine ROS  Renal/GU negative Renal ROS     Musculoskeletal  (+) Arthritis , Osteoarthritis,    Abdominal Normal abdominal exam  (+)   Peds  Hematology negative hematology ROS (+)   Anesthesia Other Findings   Reproductive/Obstetrics                            Lab Results  Component Value Date   WBC 5.4 05/25/2018   HGB 13.5 05/25/2018   HCT 41.2 05/25/2018   MCV 93.0 05/25/2018   PLT 273 05/25/2018   Lab Results  Component Value Date   CREATININE 0.68 05/25/2018   BUN 6 05/25/2018   NA 138 05/25/2018   K 4.1 05/25/2018   CL 105 05/25/2018   CO2 23 05/25/2018   Lab Results  Component Value Date   INR 1.10 05/25/2018   EKG: NSR  Stress Test:  Blood pressure demonstrated a hypertensive response to exercise.  There was no ST segment deviation noted during stress.  No T wave inversion was noted during stress.  No evidence of ischemia.  No symptoms of ischemia with exertion.  Anesthesia Physical Anesthesia Plan  ASA: III  Anesthesia Plan: General   Post-op Pain Management:    Induction: Intravenous  PONV Risk Score and Plan: 2 and Ondansetron, Dexamethasone and Midazolam  Airway Management Planned: Oral ETT  Additional Equipment: Arterial line, CVP and Ultrasound Guidance Line  Placement  Intra-op Plan:   Post-operative Plan: Post-operative intubation/ventilation  Informed Consent: I have reviewed the patients History and Physical, chart, labs and discussed the procedure including the risks, benefits and alternatives for the proposed anesthesia with the patient or authorized representative who has indicated his/her understanding and acceptance.     Dental advisory given  Plan Discussed with: CRNA  Anesthesia Plan Comments: (Remi gtt)       Anesthesia Quick Evaluation

## 2018-05-29 ENCOUNTER — Inpatient Hospital Stay (HOSPITAL_COMMUNITY): Payer: Medicaid Other | Admitting: Anesthesiology

## 2018-05-29 ENCOUNTER — Inpatient Hospital Stay (HOSPITAL_COMMUNITY): Payer: Medicaid Other

## 2018-05-29 ENCOUNTER — Encounter (HOSPITAL_COMMUNITY): Payer: Self-pay | Admitting: Registered Nurse

## 2018-05-29 ENCOUNTER — Inpatient Hospital Stay (HOSPITAL_COMMUNITY)
Admission: RE | Admit: 2018-05-29 | Discharge: 2018-07-04 | DRG: 326 | Disposition: A | Discharge status: Home / Self Care | Payer: Medicaid Other | Attending: Cardiothoracic Surgery | Admitting: Cardiothoracic Surgery

## 2018-05-29 ENCOUNTER — Inpatient Hospital Stay: Payer: Medicaid Other | Admitting: Oncology

## 2018-05-29 ENCOUNTER — Other Ambulatory Visit: Payer: Self-pay

## 2018-05-29 ENCOUNTER — Encounter (HOSPITAL_COMMUNITY)
Admission: RE | Disposition: A | Discharge status: Home / Self Care | Payer: Self-pay | Source: Home / Self Care | Attending: Cardiothoracic Surgery

## 2018-05-29 DIAGNOSIS — I071 Rheumatic tricuspid insufficiency: Secondary | ICD-10-CM | POA: Diagnosis present

## 2018-05-29 DIAGNOSIS — C159 Malignant neoplasm of esophagus, unspecified: Secondary | ICD-10-CM | POA: Diagnosis not present

## 2018-05-29 DIAGNOSIS — R0600 Dyspnea, unspecified: Secondary | ICD-10-CM

## 2018-05-29 DIAGNOSIS — K567 Ileus, unspecified: Secondary | ICD-10-CM | POA: Diagnosis not present

## 2018-05-29 DIAGNOSIS — J90 Pleural effusion, not elsewhere classified: Secondary | ICD-10-CM | POA: Diagnosis not present

## 2018-05-29 DIAGNOSIS — E440 Moderate protein-calorie malnutrition: Secondary | ICD-10-CM

## 2018-05-29 DIAGNOSIS — D62 Acute posthemorrhagic anemia: Secondary | ICD-10-CM | POA: Diagnosis not present

## 2018-05-29 DIAGNOSIS — Z9689 Presence of other specified functional implants: Secondary | ICD-10-CM

## 2018-05-29 DIAGNOSIS — E781 Pure hyperglyceridemia: Secondary | ICD-10-CM | POA: Diagnosis present

## 2018-05-29 DIAGNOSIS — I898 Other specified noninfective disorders of lymphatic vessels and lymph nodes: Secondary | ICD-10-CM | POA: Diagnosis present

## 2018-05-29 DIAGNOSIS — I469 Cardiac arrest, cause unspecified: Secondary | ICD-10-CM | POA: Diagnosis not present

## 2018-05-29 DIAGNOSIS — J690 Pneumonitis due to inhalation of food and vomit: Secondary | ICD-10-CM

## 2018-05-29 DIAGNOSIS — F17210 Nicotine dependence, cigarettes, uncomplicated: Secondary | ICD-10-CM | POA: Diagnosis present

## 2018-05-29 DIAGNOSIS — K219 Gastro-esophageal reflux disease without esophagitis: Secondary | ICD-10-CM | POA: Diagnosis present

## 2018-05-29 DIAGNOSIS — Z8249 Family history of ischemic heart disease and other diseases of the circulatory system: Secondary | ICD-10-CM | POA: Diagnosis not present

## 2018-05-29 DIAGNOSIS — J9600 Acute respiratory failure, unspecified whether with hypoxia or hypercapnia: Secondary | ICD-10-CM | POA: Diagnosis not present

## 2018-05-29 DIAGNOSIS — Z9049 Acquired absence of other specified parts of digestive tract: Secondary | ICD-10-CM

## 2018-05-29 DIAGNOSIS — N179 Acute kidney failure, unspecified: Secondary | ICD-10-CM | POA: Diagnosis not present

## 2018-05-29 DIAGNOSIS — Z934 Other artificial openings of gastrointestinal tract status: Secondary | ICD-10-CM

## 2018-05-29 DIAGNOSIS — E870 Hyperosmolality and hypernatremia: Secondary | ICD-10-CM | POA: Diagnosis not present

## 2018-05-29 DIAGNOSIS — C154 Malignant neoplasm of middle third of esophagus: Secondary | ICD-10-CM

## 2018-05-29 DIAGNOSIS — Z4659 Encounter for fitting and adjustment of other gastrointestinal appliance and device: Secondary | ICD-10-CM

## 2018-05-29 DIAGNOSIS — R001 Bradycardia, unspecified: Secondary | ICD-10-CM | POA: Diagnosis not present

## 2018-05-29 DIAGNOSIS — I10 Essential (primary) hypertension: Secondary | ICD-10-CM | POA: Diagnosis present

## 2018-05-29 DIAGNOSIS — E43 Unspecified severe protein-calorie malnutrition: Secondary | ICD-10-CM | POA: Diagnosis not present

## 2018-05-29 DIAGNOSIS — F419 Anxiety disorder, unspecified: Secondary | ICD-10-CM | POA: Diagnosis not present

## 2018-05-29 DIAGNOSIS — Z9289 Personal history of other medical treatment: Secondary | ICD-10-CM

## 2018-05-29 DIAGNOSIS — E876 Hypokalemia: Secondary | ICD-10-CM | POA: Diagnosis not present

## 2018-05-29 DIAGNOSIS — G9340 Encephalopathy, unspecified: Secondary | ICD-10-CM | POA: Diagnosis not present

## 2018-05-29 DIAGNOSIS — I462 Cardiac arrest due to underlying cardiac condition: Secondary | ICD-10-CM | POA: Diagnosis not present

## 2018-05-29 DIAGNOSIS — Z9889 Other specified postprocedural states: Secondary | ICD-10-CM

## 2018-05-29 DIAGNOSIS — I160 Hypertensive urgency: Secondary | ICD-10-CM | POA: Diagnosis not present

## 2018-05-29 DIAGNOSIS — Z978 Presence of other specified devices: Secondary | ICD-10-CM

## 2018-05-29 DIAGNOSIS — J9601 Acute respiratory failure with hypoxia: Secondary | ICD-10-CM | POA: Diagnosis not present

## 2018-05-29 DIAGNOSIS — J940 Chylous effusion: Secondary | ICD-10-CM

## 2018-05-29 DIAGNOSIS — R0602 Shortness of breath: Secondary | ICD-10-CM

## 2018-05-29 DIAGNOSIS — E871 Hypo-osmolality and hyponatremia: Secondary | ICD-10-CM | POA: Diagnosis not present

## 2018-05-29 DIAGNOSIS — I9789 Other postprocedural complications and disorders of the circulatory system, not elsewhere classified: Secondary | ICD-10-CM | POA: Diagnosis not present

## 2018-05-29 DIAGNOSIS — Z09 Encounter for follow-up examination after completed treatment for conditions other than malignant neoplasm: Secondary | ICD-10-CM

## 2018-05-29 DIAGNOSIS — R109 Unspecified abdominal pain: Secondary | ICD-10-CM

## 2018-05-29 HISTORY — PX: JEJUNOSTOMY: SHX313

## 2018-05-29 HISTORY — PX: COMPLETE ESOPHAGECTOMY: SHX5286

## 2018-05-29 HISTORY — PX: PYLOROPLASTY: SHX418

## 2018-05-29 HISTORY — PX: THORACOTOMY: SHX5074

## 2018-05-29 HISTORY — PX: CHEST TUBE INSERTION: SHX231

## 2018-05-29 HISTORY — PX: FLEXIBLE BRONCHOSCOPY: SHX5094

## 2018-05-29 LAB — POCT I-STAT 7, (LYTES, BLD GAS, ICA,H+H)
Acid-Base Excess: 3 mmol/L — ABNORMAL HIGH (ref 0.0–2.0)
Acid-base deficit: 6 mmol/L — ABNORMAL HIGH (ref 0.0–2.0)
Acid-base deficit: 1 mmol/L (ref 0.0–2.0)
Acid-base deficit: 3 mmol/L — ABNORMAL HIGH (ref 0.0–2.0)
Acid-base deficit: 4 mmol/L — ABNORMAL HIGH (ref 0.0–2.0)
Bicarbonate: 26.9 mmol/L (ref 20.0–28.0)
Bicarbonate: 17.1 mmol/L — ABNORMAL LOW (ref 20.0–28.0)
Bicarbonate: 22.6 mmol/L (ref 20.0–28.0)
Bicarbonate: 20.6 mmol/L (ref 20.0–28.0)
Bicarbonate: 24.1 mmol/L (ref 20.0–28.0)
Bicarbonate: 21.3 mmol/L (ref 20.0–28.0)
Calcium, Ion: 1.17 mmol/L (ref 1.15–1.40)
Calcium, Ion: 0.93 mmol/L — ABNORMAL LOW (ref 1.15–1.40)
Calcium, Ion: 1.14 mmol/L — ABNORMAL LOW (ref 1.15–1.40)
Calcium, Ion: 1.16 mmol/L (ref 1.15–1.40)
Calcium, Ion: 1.17 mmol/L (ref 1.15–1.40)
Calcium, Ion: 1.22 mmol/L (ref 1.15–1.40)
HCT: 34 % — ABNORMAL LOW (ref 39.0–52.0)
HCT: 25 % — ABNORMAL LOW (ref 39.0–52.0)
HCT: 33 % — ABNORMAL LOW (ref 39.0–52.0)
HCT: 29 % — ABNORMAL LOW (ref 39.0–52.0)
HCT: 34 % — ABNORMAL LOW (ref 39.0–52.0)
HCT: 34 % — ABNORMAL LOW (ref 39.0–52.0)
Hemoglobin: 11.6 g/dL — ABNORMAL LOW (ref 13.0–17.0)
Hemoglobin: 8.5 g/dL — ABNORMAL LOW (ref 13.0–17.0)
Hemoglobin: 11.2 g/dL — ABNORMAL LOW (ref 13.0–17.0)
Hemoglobin: 9.9 g/dL — ABNORMAL LOW (ref 13.0–17.0)
Hemoglobin: 11.6 g/dL — ABNORMAL LOW (ref 13.0–17.0)
Hemoglobin: 11.6 g/dL — ABNORMAL LOW (ref 13.0–17.0)
O2 Saturation: 100 %
O2 Saturation: 100 %
O2 Saturation: 100 %
O2 Saturation: 100 %
O2 Saturation: 100 %
O2 Saturation: 99 %
Patient temperature: 35.2
Patient temperature: 34.6
Patient temperature: 34.9
Patient temperature: 35.1
Patient temperature: 36.2
Potassium: 3.6 mmol/L (ref 3.5–5.1)
Potassium: 2.7 mmol/L — CL (ref 3.5–5.1)
Potassium: 4.4 mmol/L (ref 3.5–5.1)
Potassium: 4.2 mmol/L (ref 3.5–5.1)
Potassium: 4.1 mmol/L (ref 3.5–5.1)
Potassium: 4.1 mmol/L (ref 3.5–5.1)
Sodium: 135 mmol/L (ref 135–145)
Sodium: 142 mmol/L (ref 135–145)
Sodium: 135 mmol/L (ref 135–145)
Sodium: 136 mmol/L (ref 135–145)
Sodium: 136 mmol/L (ref 135–145)
Sodium: 137 mmol/L (ref 135–145)
TCO2: 28 mmol/L (ref 22–32)
TCO2: 18 mmol/L — ABNORMAL LOW (ref 22–32)
TCO2: 24 mmol/L (ref 22–32)
TCO2: 22 mmol/L (ref 22–32)
TCO2: 25 mmol/L (ref 22–32)
TCO2: 23 mmol/L (ref 22–32)
pCO2 arterial: 34.7 mmHg (ref 32.0–48.0)
pCO2 arterial: 22.2 mmHg — ABNORMAL LOW (ref 32.0–48.0)
pCO2 arterial: 31.5 mmHg — ABNORMAL LOW (ref 32.0–48.0)
pCO2 arterial: 30 mmHg — ABNORMAL LOW (ref 32.0–48.0)
pCO2 arterial: 37.6 mmHg (ref 32.0–48.0)
pCO2 arterial: 39.1 mmHg (ref 32.0–48.0)
pH, Arterial: 7.49 — ABNORMAL HIGH (ref 7.350–7.450)
pH, Arterial: 7.484 — ABNORMAL HIGH (ref 7.350–7.450)
pH, Arterial: 7.454 — ABNORMAL HIGH (ref 7.350–7.450)
pH, Arterial: 7.438 (ref 7.350–7.450)
pH, Arterial: 7.416 (ref 7.350–7.450)
pH, Arterial: 7.341 — ABNORMAL LOW (ref 7.350–7.450)
pO2, Arterial: 445 mmHg — ABNORMAL HIGH (ref 83.0–108.0)
pO2, Arterial: 280 mmHg — ABNORMAL HIGH (ref 83.0–108.0)
pO2, Arterial: 257 mmHg — ABNORMAL HIGH (ref 83.0–108.0)
pO2, Arterial: 243 mmHg — ABNORMAL HIGH (ref 83.0–108.0)
pO2, Arterial: 447 mmHg — ABNORMAL HIGH (ref 83.0–108.0)
pO2, Arterial: 167 mmHg — ABNORMAL HIGH (ref 83.0–108.0)

## 2018-05-29 LAB — CBC
HCT: 33.7 % — ABNORMAL LOW (ref 39.0–52.0)
Hemoglobin: 11.1 g/dL — ABNORMAL LOW (ref 13.0–17.0)
MCH: 30.1 pg (ref 26.0–34.0)
MCHC: 32.9 g/dL (ref 30.0–36.0)
MCV: 91.3 fL (ref 80.0–100.0)
Platelets: 206 10*3/uL (ref 150–400)
RBC: 3.69 MIL/uL — ABNORMAL LOW (ref 4.22–5.81)
RDW: 12.5 % (ref 11.5–15.5)
WBC: 5.1 10*3/uL (ref 4.0–10.5)
nRBC: 0 % (ref 0.0–0.2)

## 2018-05-29 LAB — BASIC METABOLIC PANEL
Anion gap: 8 (ref 5–15)
BUN: 6 mg/dL (ref 6–20)
CO2: 21 mmol/L — ABNORMAL LOW (ref 22–32)
Calcium: 8.4 mg/dL — ABNORMAL LOW (ref 8.9–10.3)
Chloride: 104 mmol/L (ref 98–111)
Creatinine, Ser: 1.1 mg/dL (ref 0.61–1.24)
GFR calc Af Amer: >60 mL/min (ref >60)
GFR calc non Af Amer: >60 mL/min (ref >60)
Glucose, Bld: 162 mg/dL — ABNORMAL HIGH (ref 70–99)
Potassium: 4.1 mmol/L (ref 3.5–5.1)
Sodium: 133 mmol/L — ABNORMAL LOW (ref 135–145)

## 2018-05-29 LAB — GLUCOSE, CAPILLARY: Glucose-Capillary: 178 mg/dL — ABNORMAL HIGH (ref 70–99)

## 2018-05-29 SURGERY — ESOPHAGECTOMY, TOTAL
Anesthesia: General | Site: Chest | Laterality: Right

## 2018-05-29 MED ORDER — LACTATED RINGERS IV SOLN
INTRAVENOUS | Status: DC | PRN
  Administered 2018-05-29 (×2): via INTRAVENOUS

## 2018-05-29 MED ORDER — HYDROMORPHONE HCL 1 MG/ML IJ SOLN
INTRAMUSCULAR | Status: DC | PRN
  Administered 2018-05-29: 0.5 mg via INTRAVENOUS
  Administered 2018-05-29: 1 mg via INTRAVENOUS
  Administered 2018-05-29: 0.5 mg via INTRAVENOUS

## 2018-05-29 MED ORDER — DEXAMETHASONE SODIUM PHOSPHATE 10 MG/ML IJ SOLN
INTRAMUSCULAR | Status: AC
  Filled 2018-05-29: qty 1

## 2018-05-29 MED ORDER — FENTANYL CITRATE (PF) 250 MCG/5ML IJ SOLN
INTRAMUSCULAR | Status: AC
  Filled 2018-05-29: qty 5

## 2018-05-29 MED ORDER — POTASSIUM CHLORIDE 10 MEQ/50ML IV SOLN
10.0000 meq | INTRAVENOUS | Status: DC
  Administered 2018-05-29: 10 meq via INTRAVENOUS
  Filled 2018-05-29: qty 150
  Filled 2018-05-29: qty 50
  Filled 2018-05-29: qty 150

## 2018-05-29 MED ORDER — ALBUTEROL SULFATE (2.5 MG/3ML) 0.083% IN NEBU: 2.5000 mg | INHALATION_SOLUTION | Freq: Four times a day (QID) | RESPIRATORY_TRACT | Status: AC | PRN

## 2018-05-29 MED ORDER — PHENYLEPHRINE 40 MCG/ML (10ML) SYRINGE FOR IV PUSH (FOR BLOOD PRESSURE SUPPORT)
PREFILLED_SYRINGE | INTRAVENOUS | Status: DC | PRN
  Administered 2018-05-29: 80 ug via INTRAVENOUS
  Administered 2018-05-29: 40 ug via INTRAVENOUS
  Administered 2018-05-29: 80 ug via INTRAVENOUS
  Administered 2018-05-29 (×2): 40 ug via INTRAVENOUS
  Administered 2018-05-29: 80 ug via INTRAVENOUS
  Administered 2018-05-29: 40 ug via INTRAVENOUS
  Administered 2018-05-29: 80 ug via INTRAVENOUS
  Administered 2018-05-29: 40 ug via INTRAVENOUS

## 2018-05-29 MED ORDER — SODIUM CHLORIDE (PF) 0.9 % IJ SOLN
INTRAMUSCULAR | Status: AC
  Filled 2018-05-29: qty 10

## 2018-05-29 MED ORDER — ESMOLOL HCL 100 MG/10ML IV SOLN
INTRAVENOUS | Status: DC | PRN
  Administered 2018-05-29 (×2): 10 mg via INTRAVENOUS
  Administered 2018-05-29: 30 mg via INTRAVENOUS
  Administered 2018-05-29 (×2): 20 mg via INTRAVENOUS
  Administered 2018-05-29: 10 mg via INTRAVENOUS

## 2018-05-29 MED ORDER — EPHEDRINE 5 MG/ML INJ
INTRAVENOUS | Status: AC
  Filled 2018-05-29: qty 10

## 2018-05-29 MED ORDER — MORPHINE SULFATE (PF) 2 MG/ML IV SOLN: 1.0000 mg | INTRAVENOUS | Status: DC | PRN

## 2018-05-29 MED ORDER — INSULIN ASPART 100 UNIT/ML ~~LOC~~ SOLN
0.0000 [IU] | SUBCUTANEOUS | Status: DC
  Administered 2018-05-29: 4 [IU] via SUBCUTANEOUS
  Administered 2018-05-30 – 2018-06-04 (×20): 2 [IU] via SUBCUTANEOUS
  Administered 2018-06-05: 4 [IU] via SUBCUTANEOUS
  Administered 2018-06-05 – 2018-06-09 (×21): 2 [IU] via SUBCUTANEOUS
  Administered 2018-06-09: 4 [IU] via SUBCUTANEOUS
  Administered 2018-06-10 – 2018-06-13 (×7): 2 [IU] via SUBCUTANEOUS

## 2018-05-29 MED ORDER — ONDANSETRON HCL 4 MG/2ML IJ SOLN
INTRAMUSCULAR | Status: AC
  Filled 2018-05-29: qty 2

## 2018-05-29 MED ORDER — NOREPINEPHRINE 4 MG/250ML-% IV SOLN
0.0000 ug/min | INTRAVENOUS | Status: DC
  Filled 2018-05-29: qty 250

## 2018-05-29 MED ORDER — HYDROMORPHONE HCL 1 MG/ML IJ SOLN
INTRAMUSCULAR | Status: AC
  Filled 2018-05-29: qty 1

## 2018-05-29 MED ORDER — DEXTROSE-NACL 5-0.9 % IV SOLN
INTRAVENOUS | Status: DC
  Administered 2018-05-29 – 2018-06-06 (×24): via INTRAVENOUS

## 2018-05-29 MED ORDER — ONDANSETRON HCL 4 MG/2ML IJ SOLN
4.0000 mg | INTRAMUSCULAR | Status: DC | PRN
  Administered 2018-06-02 – 2018-06-28 (×2): 4 mg via INTRAVENOUS
  Filled 2018-05-29: qty 2

## 2018-05-29 MED ORDER — ALBUMIN HUMAN 5 % IV SOLN
INTRAVENOUS | Status: DC | PRN
  Administered 2018-05-29 (×5): via INTRAVENOUS

## 2018-05-29 MED ORDER — CHLORHEXIDINE GLUCONATE 0.12 % MT SOLN
15.0000 mL | OROMUCOSAL | Status: AC
  Administered 2018-05-29: 15 mL via OROMUCOSAL
  Filled 2018-05-29: qty 15

## 2018-05-29 MED ORDER — SODIUM CHLORIDE 0.9 % IV SOLN
INTRAVENOUS | Status: DC | PRN
  Administered 2018-05-29: 15 ug/min via INTRAVENOUS

## 2018-05-29 MED ORDER — ONDANSETRON HCL 4 MG/2ML IJ SOLN
INTRAMUSCULAR | Status: DC | PRN
  Administered 2018-05-29: 4 mg via INTRAVENOUS

## 2018-05-29 MED ORDER — MORPHINE SULFATE (PF) 2 MG/ML IV SOLN
1.0000 mg | INTRAVENOUS | Status: DC | PRN
  Administered 2018-05-29 (×2): 4 mg via INTRAVENOUS
  Administered 2018-05-29: 2 mg via INTRAVENOUS
  Administered 2018-05-29: 3 mg via INTRAVENOUS
  Administered 2018-05-30 (×8): 4 mg via INTRAVENOUS
  Filled 2018-05-29 (×6): qty 2
  Filled 2018-05-29: qty 1
  Filled 2018-05-29 (×5): qty 2

## 2018-05-29 MED ORDER — SODIUM CHLORIDE 0.9 % IV SOLN
2.0000 g | INTRAVENOUS | Status: AC
  Administered 2018-05-29 (×3): 2 g via INTRAVENOUS
  Filled 2018-05-29 (×2): qty 2

## 2018-05-29 MED ORDER — ROCURONIUM BROMIDE 10 MG/ML (PF) SYRINGE
PREFILLED_SYRINGE | INTRAVENOUS | Status: DC | PRN
  Administered 2018-05-29 (×2): 15 mg via INTRAVENOUS
  Administered 2018-05-29: 50 mg via INTRAVENOUS
  Administered 2018-05-29 (×2): 20 mg via INTRAVENOUS

## 2018-05-29 MED ORDER — ARTIFICIAL TEARS OPHTHALMIC OINT
TOPICAL_OINTMENT | OPHTHALMIC | Status: DC | PRN
  Administered 2018-05-29: 1 via OPHTHALMIC

## 2018-05-29 MED ORDER — PROPOFOL 10 MG/ML IV BOLUS
INTRAVENOUS | Status: DC | PRN
  Administered 2018-05-29: 150 mg via INTRAVENOUS

## 2018-05-29 MED ORDER — FENTANYL CITRATE (PF) 250 MCG/5ML IJ SOLN
INTRAMUSCULAR | Status: DC | PRN
  Administered 2018-05-29: 100 ug via INTRAVENOUS
  Administered 2018-05-29: 50 ug via INTRAVENOUS
  Administered 2018-05-29: 100 ug via INTRAVENOUS
  Administered 2018-05-29 (×2): 50 ug via INTRAVENOUS
  Administered 2018-05-29: 25 ug via INTRAVENOUS
  Administered 2018-05-29: 225 ug via INTRAVENOUS

## 2018-05-29 MED ORDER — PANTOPRAZOLE SODIUM 40 MG IV SOLR
40.0000 mg | Freq: Two times a day (BID) | INTRAVENOUS | Status: DC
  Administered 2018-05-29 – 2018-06-17 (×39): 40 mg via INTRAVENOUS
  Filled 2018-05-29 (×39): qty 40

## 2018-05-29 MED ORDER — VECURONIUM BROMIDE 10 MG IV SOLR
INTRAVENOUS | Status: DC | PRN
  Administered 2018-05-29: 5 mg via INTRAVENOUS

## 2018-05-29 MED ORDER — DEXMEDETOMIDINE HCL IN NACL 400 MCG/100ML IV SOLN
0.4000 ug/kg/h | INTRAVENOUS | Status: AC
  Administered 2018-05-29: .2 ug/kg/h via INTRAVENOUS
  Filled 2018-05-29: qty 100

## 2018-05-29 MED ORDER — LIDOCAINE 2% (20 MG/ML) 5 ML SYRINGE
INTRAMUSCULAR | Status: AC
  Filled 2018-05-29: qty 5

## 2018-05-29 MED ORDER — FUROSEMIDE 10 MG/ML IJ SOLN
INTRAMUSCULAR | Status: DC | PRN
  Administered 2018-05-29 (×2): 10 mg via INTRAMUSCULAR

## 2018-05-29 MED ORDER — MIDAZOLAM HCL 5 MG/5ML IJ SOLN
INTRAMUSCULAR | Status: DC | PRN
  Administered 2018-05-29: 2 mg via INTRAVENOUS

## 2018-05-29 MED ORDER — DEXMEDETOMIDINE HCL IN NACL 200 MCG/50ML IV SOLN
0.0000 ug/kg/h | INTRAVENOUS | Status: DC
  Administered 2018-05-29: 0.7 ug/kg/h via INTRAVENOUS
  Administered 2018-05-30: 0.2 ug/kg/h via INTRAVENOUS
  Filled 2018-05-29: qty 50

## 2018-05-29 MED ORDER — SODIUM CHLORIDE 0.9 % IV SOLN
2.0000 g | INTRAVENOUS | Status: DC
  Filled 2018-05-29 (×2): qty 2

## 2018-05-29 MED ORDER — MIDAZOLAM HCL 2 MG/2ML IJ SOLN
INTRAMUSCULAR | Status: AC
  Filled 2018-05-29: qty 2

## 2018-05-29 MED ORDER — LACTATED RINGERS IV SOLN
INTRAVENOUS | Status: DC | PRN
  Administered 2018-05-29 (×2): via INTRAVENOUS

## 2018-05-29 MED ORDER — SODIUM CHLORIDE 0.9 % IV SOLN
0.0500 ug/kg/min | INTRAVENOUS | Status: AC
  Administered 2018-05-29 (×8): 20 ug via INTRAVENOUS
  Administered 2018-05-29: 0.1 ug/kg/min via INTRAVENOUS
  Filled 2018-05-29: qty 4000

## 2018-05-29 MED ORDER — ARTIFICIAL TEARS OPHTHALMIC OINT
TOPICAL_OINTMENT | OPHTHALMIC | Status: AC
  Filled 2018-05-29: qty 3.5

## 2018-05-29 MED ORDER — DEXAMETHASONE SODIUM PHOSPHATE 10 MG/ML IJ SOLN
INTRAMUSCULAR | Status: DC | PRN
  Administered 2018-05-29: 10 mg via INTRAVENOUS

## 2018-05-29 MED ORDER — LIDOCAINE 2% (20 MG/ML) 5 ML SYRINGE
INTRAMUSCULAR | Status: DC | PRN
  Administered 2018-05-29: 50 mg via INTRAVENOUS

## 2018-05-29 MED ORDER — POTASSIUM CHLORIDE 10 MEQ/50ML IV SOLN
10.0000 meq | INTRAVENOUS | Status: DC | PRN
  Administered 2018-06-01 – 2018-06-08 (×4): 10 meq via INTRAVENOUS
  Filled 2018-05-29 (×5): qty 50

## 2018-05-29 MED ORDER — PHENYLEPHRINE 40 MCG/ML (10ML) SYRINGE FOR IV PUSH (FOR BLOOD PRESSURE SUPPORT)
PREFILLED_SYRINGE | INTRAVENOUS | Status: AC
  Filled 2018-05-29: qty 10

## 2018-05-29 MED ORDER — LACTATED RINGERS IV SOLN
INTRAVENOUS | Status: DC | PRN
  Administered 2018-05-29 (×3): via INTRAVENOUS

## 2018-05-29 MED ORDER — 0.9 % SODIUM CHLORIDE (POUR BTL) OPTIME
TOPICAL | Status: DC | PRN
  Administered 2018-05-29: 3000 mL

## 2018-05-29 MED ORDER — ROCURONIUM BROMIDE 50 MG/5ML IV SOSY
PREFILLED_SYRINGE | INTRAVENOUS | Status: AC
  Filled 2018-05-29: qty 5

## 2018-05-29 MED ORDER — SODIUM CHLORIDE 0.9 % IV SOLN
2.0000 g | Freq: Four times a day (QID) | INTRAVENOUS | Status: AC
  Administered 2018-05-29 – 2018-05-30 (×3): 2 g via INTRAVENOUS
  Filled 2018-05-29 (×4): qty 2

## 2018-05-29 MED ORDER — PROPOFOL 10 MG/ML IV BOLUS
INTRAVENOUS | Status: AC
  Filled 2018-05-29: qty 20

## 2018-05-29 MED ORDER — ACETAMINOPHEN 10 MG/ML IV SOLN
1000.0000 mg | Freq: Four times a day (QID) | INTRAVENOUS | Status: AC
  Administered 2018-05-29 – 2018-05-30 (×4): 1000 mg via INTRAVENOUS
  Filled 2018-05-29 (×4): qty 100

## 2018-05-29 SURGICAL SUPPLY — 138 items
CANISTER SUCT 3000ML PPV (MISCELLANEOUS) ×6 IMPLANT
CATH FOLEY 2WAY SLVR 18FR 30CC (CATHETERS) ×18 IMPLANT
CATH ROBINSON RED A/P 18FR (CATHETERS) ×6 IMPLANT
CATH ROBINSON RED A/P 22FR (CATHETERS) ×6 IMPLANT
CATH THORACIC 28FR (CATHETERS) ×6 IMPLANT
CATH THORACIC 36FR (CATHETERS) IMPLANT
CATH THORACIC 36FR RT ANG (CATHETERS) IMPLANT
CLIP FOGARTY SPRING 6M (CLIP) ×12 IMPLANT
CLIP VESOCCLUDE LG 6/CT (CLIP) ×6 IMPLANT
CLIP VESOCCLUDE MED 24/CT (CLIP) ×6 IMPLANT
CLIP VESOCCLUDE SM WIDE 24/CT (CLIP) ×12 IMPLANT
CONT SPEC 4OZ CLIKSEAL STRL BL (MISCELLANEOUS) ×12 IMPLANT
COVER PROBE W GEL 5X96 (DRAPES) ×6 IMPLANT
COVER WAND RF STERILE (DRAPES) ×6 IMPLANT
DERMABOND ADVANCED (GAUZE/BANDAGES/DRESSINGS) ×1
DERMABOND ADVANCED .7 DNX12 (GAUZE/BANDAGES/DRESSINGS) ×5 IMPLANT
DRAIN CHANNEL 28F RND 3/8 FF (WOUND CARE) IMPLANT
DRAIN PENROSE 1/2X36 STERILE (WOUND CARE) IMPLANT
DRAIN PENROSE 18X1/2 LTX STRL (DRAIN) ×12 IMPLANT
DRAIN SUMP SARATOGA 24F (WOUND CARE) ×6 IMPLANT
DRAPE CAMERA VIDEO/LASER (DRAPES) IMPLANT
DRAPE CV SPLIT W-CLR ANES SCRN (DRAPES) ×6 IMPLANT
DRAPE INCISE IOBAN 66X45 STRL (DRAPES) ×6 IMPLANT
DRAPE LAPAROSCOPIC ABDOMINAL (DRAPES) ×6 IMPLANT
DRAPE PERI GROIN 82X75IN TIB (DRAPES) ×6 IMPLANT
DRAPE SLUSH/WARMER DISC (DRAPES) ×6 IMPLANT
DRILL BIT 7/64X5 (BIT) IMPLANT
DRSG AQUACEL AG ADV 3.5X10 (GAUZE/BANDAGES/DRESSINGS) ×6 IMPLANT
DRSG AQUACEL AG ADV 3.5X14 (GAUZE/BANDAGES/DRESSINGS) ×6 IMPLANT
ELECT BLADE 4.0 EZ CLEAN MEGAD (MISCELLANEOUS) ×6
ELECT BLADE 6.5 EXT (BLADE) ×6 IMPLANT
ELECT NEEDLE TIP 2.8 STRL (NEEDLE) ×6 IMPLANT
ELECT REM PT RETURN 9FT ADLT (ELECTROSURGICAL) ×12
ELECTRODE BLDE 4.0 EZ CLN MEGD (MISCELLANEOUS) ×5 IMPLANT
ELECTRODE REM PT RTRN 9FT ADLT (ELECTROSURGICAL) ×10 IMPLANT
GAUZE SPONGE 4X4 12PLY STRL (GAUZE/BANDAGES/DRESSINGS) ×6 IMPLANT
GAUZE SPONGE 4X4 12PLY STRL LF (GAUZE/BANDAGES/DRESSINGS) ×6 IMPLANT
GLOVE BIO SURGEON STRL SZ 6.5 (GLOVE) ×18 IMPLANT
GOWN STRL REUS W/ TWL LRG LVL3 (GOWN DISPOSABLE) ×20 IMPLANT
GOWN STRL REUS W/TWL LRG LVL3 (GOWN DISPOSABLE) ×4
HANDLE STAPLE ENDO GIA SHORT (STAPLE) ×2
HEMOSTAT SURGICEL 2X14 (HEMOSTASIS) IMPLANT
INSERT FOGARTY 61MM (MISCELLANEOUS) IMPLANT
KIT BASIN OR (CUSTOM PROCEDURE TRAY) ×6 IMPLANT
KIT SUCTION CATH 14FR (SUCTIONS) ×6 IMPLANT
KIT TURNOVER KIT B (KITS) ×6 IMPLANT
LOOP VESSEL MINI RED (MISCELLANEOUS) ×6 IMPLANT
NEEDLE SPNL 18GX3.5 QUINCKE PK (NEEDLE) IMPLANT
NS IRRIG 1000ML POUR BTL (IV SOLUTION) ×18 IMPLANT
PACK CHEST (CUSTOM PROCEDURE TRAY) ×6 IMPLANT
PAD ARMBOARD 7.5X6 YLW CONV (MISCELLANEOUS) ×12 IMPLANT
PAD SHARPS MAGNETIC DISPOSAL (MISCELLANEOUS) IMPLANT
PASSER SUT SWANSON 36MM LOOP (INSTRUMENTS) IMPLANT
PENCIL BUTTON HOLSTER BLD 10FT (ELECTRODE) IMPLANT
PLUG CATH AND CAP STER (CATHETERS) ×6 IMPLANT
PROBE NERVBE PRASS .33 (MISCELLANEOUS) ×6 IMPLANT
PROBE PENCIL 8 MHZ STRL DISP (MISCELLANEOUS) ×6 IMPLANT
RELOAD EGIA 45 MED/THCK PURPLE (STAPLE) ×6 IMPLANT
RELOAD EGIA TRIS TAN 45 CVD (STAPLE) ×18 IMPLANT
RELOAD LINEAR CUT PROX 55 BLUE (ENDOMECHANICALS) ×18 IMPLANT
RELOAD TRI 2.0 30 MED THCK SUL (STAPLE) IMPLANT
RELOAD TRI 2.0 30 VAS MED SUL (STAPLE) ×6 IMPLANT
RETAINER VISCERA MED (MISCELLANEOUS) ×6 IMPLANT
RETRACTOR WND ALEXIS 25 LRG (MISCELLANEOUS) ×5 IMPLANT
RTRCTR WOUND ALEXIS 25CM LRG (MISCELLANEOUS) ×6
SCISSORS LAP 5X35 DISP (ENDOMECHANICALS) ×6 IMPLANT
SEALER TISSUE X1 CVD JAW (INSTRUMENTS) ×6 IMPLANT
SHEARS HARMONIC ACE PLUS 36CM (ENDOMECHANICALS) ×12 IMPLANT
SHEARS HARMONIC HDI 20CM (ELECTROSURGICAL) ×6 IMPLANT
SOLUTION ANTI FOG 6CC (MISCELLANEOUS) ×6 IMPLANT
SPECIMEN JAR LG PLASTIC EMPTY (MISCELLANEOUS) IMPLANT
SPECIMEN JAR MEDIUM (MISCELLANEOUS) IMPLANT
SPONGE LAP 18X18 RF (DISPOSABLE) ×12 IMPLANT
SPONGE LAP 18X18 X RAY DECT (DISPOSABLE) ×24 IMPLANT
STAPLE RELOAD 2.5MM WHITE (STAPLE) IMPLANT
STAPLER ENDO GIA 12MM SHORT (STAPLE) ×10 IMPLANT
STAPLER PROXIMATE 55 BLUE (STAPLE) ×6 IMPLANT
STAPLER VASCULAR ECHELON 35 (CUTTER) IMPLANT
STAPLER VISISTAT 35W (STAPLE) IMPLANT
STOPCOCK 4 WAY LG BORE MALE ST (IV SETS) IMPLANT
SUCTION POOLE TIP (SUCTIONS) IMPLANT
SUT ETHILON 3 0 FSL (SUTURE) ×12 IMPLANT
SUT PDS AB 1 TP1 96 (SUTURE) ×12 IMPLANT
SUT PDS AB 4-0 SH 27 (SUTURE) IMPLANT
SUT PROLENE 0 CT 1 CR/8 (SUTURE) IMPLANT
SUT PROLENE 2 0 MH 48 (SUTURE) IMPLANT
SUT PROLENE 2 TP 1 (SUTURE) ×12 IMPLANT
SUT PROLENE 3 0 RB 1 (SUTURE) ×12 IMPLANT
SUT PROLENE 3 0 SH DA (SUTURE) IMPLANT
SUT PROLENE 3 0 SH1 36 (SUTURE) ×12 IMPLANT
SUT PROLENE 4 0 RB 1 (SUTURE) ×2
SUT PROLENE 4-0 RB1 .5 CRCL 36 (SUTURE) ×10 IMPLANT
SUT SILK  1 MH (SUTURE) ×5
SUT SILK 1 MH (SUTURE) ×25 IMPLANT
SUT SILK 1 TIES 10X30 (SUTURE) IMPLANT
SUT SILK 2 0 SH CR/8 (SUTURE) ×12 IMPLANT
SUT SILK 2 0SH CR/8 30 (SUTURE) ×6 IMPLANT
SUT SILK 3 0 SH CR/8 (SUTURE) ×12 IMPLANT
SUT SILK 3 0SH CR/8 30 (SUTURE) IMPLANT
SUT SILK 4 0 SH CR/8 (SUTURE) IMPLANT
SUT VIC AB 1 CTX 18 (SUTURE) IMPLANT
SUT VIC AB 1 CTX 27 (SUTURE) IMPLANT
SUT VIC AB 1 CTX 36 (SUTURE) ×1
SUT VIC AB 1 CTX36XBRD ANBCTR (SUTURE) ×5 IMPLANT
SUT VIC AB 2-0 CT1 18 (SUTURE) IMPLANT
SUT VIC AB 2-0 CT1 27 (SUTURE) ×1
SUT VIC AB 2-0 CT1 TAPERPNT 27 (SUTURE) ×5 IMPLANT
SUT VIC AB 2-0 CTX 36 (SUTURE) ×6 IMPLANT
SUT VIC AB 3-0 MH 27 (SUTURE) IMPLANT
SUT VIC AB 3-0 SH 18 (SUTURE) IMPLANT
SUT VIC AB 3-0 X1 27 (SUTURE) ×12 IMPLANT
SUT VIC AB 4-0 SH 18 (SUTURE) ×18 IMPLANT
SUT VICRYL 2 TP 1 (SUTURE) IMPLANT
SYR 10ML LL (SYRINGE) IMPLANT
SYR 20CC LL (SYRINGE) IMPLANT
SYR 30ML LL (SYRINGE) ×6 IMPLANT
SYR 30ML SLIP (SYRINGE) IMPLANT
SYR 5ML LL (SYRINGE) IMPLANT
SYR 5ML LUER SLIP (SYRINGE) IMPLANT
SYR TOOMEY 50ML (SYRINGE) IMPLANT
SYRINGE 60CC LL (MISCELLANEOUS) IMPLANT
SYSTEM SAHARA CHEST DRAIN ATS (WOUND CARE) ×6 IMPLANT
SYSTEM SAHARA CHEST DRAIN RE-I (WOUND CARE) ×6 IMPLANT
TAPE CLOTH SURG 4X10 WHT LF (GAUZE/BANDAGES/DRESSINGS) ×12 IMPLANT
TAPE UMBILICAL 1/8 X36 TWILL (MISCELLANEOUS) IMPLANT
TAPE UMBILICAL COTTON 1/8X30 (MISCELLANEOUS) IMPLANT
TOWEL GREEN STERILE (TOWEL DISPOSABLE) ×6 IMPLANT
TOWEL GREEN STERILE FF (TOWEL DISPOSABLE) ×6 IMPLANT
TRAP SPECIMEN MUCOUS 40CC (MISCELLANEOUS) IMPLANT
TRAY FOLEY MTR SLVR 16FR STAT (SET/KITS/TRAYS/PACK) ×6 IMPLANT
TROCAR BLADELESS 12MM (ENDOMECHANICALS) IMPLANT
TUBE CONNECTING 12X1/4 (SUCTIONS) IMPLANT
TUBE ENDOTRAC EMG 7X10.2 (MISCELLANEOUS) IMPLANT
TUBE ENDOTRAC EMG 8X11.3 (MISCELLANEOUS) ×6 IMPLANT
TUBE ENDOTRACH  EMG 6MMTUBE EN (MISCELLANEOUS)
TUBE ENDOTRACH EMG 6MMTUBE EN (MISCELLANEOUS) IMPLANT
TUBING EXTENTION W/L.L. (IV SETS) ×6 IMPLANT
WATER STERILE IRR 1000ML POUR (IV SOLUTION) ×12 IMPLANT

## 2018-05-29 NOTE — Progress Notes (Signed)
Patient failed wean at this time due to apnea. RT placed patient back on SIMV with a set respiratory rate of 12. RT will attempt wean again at a later time. RN is aware.

## 2018-05-29 NOTE — H&P (Signed)
[address]       [place] [address]             [phone]                    [name] [place]ike Medical Record #[id number] Date of Birth: 01-Jan-1956 Referring: Ladell Pier, MD Primary Care: Nolene Ebbs, MD Primary Cardiologist: No primary care provider on file.  Chief Complaint:    Chief Complaint  Patient presents with  . Esophageal Cancer    f/u after cardiac clearence 05/04/18    History of Present Illness:    Aaron Wang 63 y.o. male  Has been  followed in the office for squamous cell carcinoma of the esophagus.  Several months ago the patient noted increasing episodes of painful swallowing.  He underwent upper GI endoscopy PET scan and EUS. He notes that he has lost approximately 6 pounds over the last 6 months.  He is a long-term smoker and is continuing to smoke.  He has significantly cut down on his smoking now just a few cigarettes a day.  MRI has been completed to rule out the possibility of liver metastasis, and there is no evidence of liver metastasis.    He has  concernsare about a place to live after surgery, he notes that for the past 3 years he has been living in the [facility] [id number] and staying in room 157.  He is been seen by social work to assist in arranging for home care after discharge.     Current Activity/ Functional Status:  Patient is independent with mobility/ambulation, transfers, ADL's, IADL's.   Zubrod Score: At the time of surgery this patient's most appropriate activity status/level should be described as: []     0    Normal activity, no symptoms [x]     1    Restricted in physical strenuous activity but ambulatory, able to do out light work []     2    Ambulatory and capable of self care, unable to do work activities, up and about               >50 % of waking hours                              []     3    Only limited self care, in bed greater than 50% of waking hours []     4    Completely  disabled, no self care, confined to bed or chair []     5    Moribund   Past Medical History:  Diagnosis Date  . Back pain   . Carpal tunnel syndrome   . Esophageal cancer ([place])   . GERD (gastroesophageal reflux disease)   . Hypertension   . Injury    tore ligaments left knee    Past Surgical History:  Procedure Laterality Date  . BACK SURGERY  1983   L 4 L5 disectomy  . WRIST SURGERY Bilateral    carpal tunnel    Family History  Problem Relation Age of Onset  . Heart attack Mother   . Colon cancer Neg Hx      Social History   Tobacco Use  Smoking Status Current Every Day Smoker  . Packs/day: 0.25  . Years: 7.00  . Pack years: 1.75  Smokeless Tobacco Never Used    Social  History   Substance and Sexual Activity  Alcohol Use Yes  . Alcohol/week: 4.0 standard drinks  . Types: 4 Cans of beer per week     No Known Allergies  Current Facility-Administered Medications  Medication Dose Route Frequency Provider Last Rate Last Dose  . cefOXitin (MEFOXIN) 2 g in sodium chloride 0.9 % 100 mL IVPB  2 g Intravenous On Call to OR [name], MD      . dexmedetomidine (PRECEDEX) 400 MCG/100ML (4 mcg/mL) infusion  0.4-1.2 mcg/kg/hr Intravenous To OR [name], MD      . norepinephrine (LEVOPHED) 4mg  in 240mL premix infusion  0-40 mcg/min Intravenous To OR [name], MD      . remifentanil (ULTIVA) 5000 mcg in 250 mL normal saline (20 mcg/mL) for ICU use  0.05 mcg/kg/min Intravenous To OR [name], MD        Pertinent items are noted in HPI.   Review of Systems:     Cardiac Review of Systems: [Y] = yes  or   [ N ] = no   Chest Pain [ n  Resting SOB [ n Exertional SOB  [n ]  Orthopnea [n ]   Pedal Edema [n   Palpitations [n ] Syncope  [n   Presyncope [n]   General Review of Systems: [Y] = yes [  ]=no Constitional: recent weight change Blue.Reese  ];  Wt loss over the last 3 months [ 6  ] anorexia [  ]; fatigue [ 6 ]; nausea [  ]; night sweats  [  ]; fever [  ]; or chills [  ];           Eye : blurred vision [  ]; diplopia [   ]; vision changes [  ];  Amaurosis fugax[  ]; Resp: cough [  ];  wheezing[  ];  hemoptysis[  ]; shortness of breath[  ]; paroxysmal nocturnal dyspnea[  ]; dyspnea on exertion[  ]; or orthopnea[  ];  GI:  gallstones[  ], vomiting[  ];  dysphagia[  ]; melena[  ];  hematochezia [  ]; heartburn[  ];   Hx of  Colonoscopy[  ]; GU: kidney stones [  ]; hematuria[  ];   dysuria [  ];  nocturia[  ];  history of     obstruction [  ]; urinary frequency [  ]             Skin: rash, swelling[  ];, hair loss[  ];  peripheral edema[  ];  or itching[  ]; Musculosketetal: myalgias[  ];  joint swelling[  ];  joint erythema[  ];  joint pain[  ];  back pain[  ];  Heme/Lymph: bruising[  ];  bleeding[  ];  anemia[  ];  Neuro: TIA[  ];  headaches[  ];  stroke[  ];  vertigo[  ];  seizures[  ];   paresthesias[  ];  difficulty walking[  ];  Psych:depression[  ]; anxiety[  ];  Endocrine: diabetes[  ];  thyroid dysfunction[  ];  Immunizations: Flu up to date [  ]; Pneumococcal up to date [  ];  Other:     PHYSICAL EXAMINATION: BP (!) 181/79   Pulse 65   Temp 98 F (36.7 C) (Oral)   Resp 20   Ht 5\' 7"  (1.702 m)   Wt 58.5 kg   SpO2 98%   BMI 20.20 kg/m   General appearance: alert, cooperative and  cachectic Head: Normocephalic, without obvious abnormality, atraumatic Neck: no adenopathy, no carotid bruit, no JVD, supple, symmetrical, trachea midline and thyroid not enlarged, symmetric, no tenderness/mass/nodules Lymph nodes: Cervical, supraclavicular, and axillary nodes normal. Resp: clear to auscultation bilaterally Back: symmetric, no curvature. ROM normal. No CVA tenderness. Cardio: regular rate and rhythm, S1, S2 normal, no murmur, click, rub or gallop GI: soft, non-tender; bowel sounds normal; no masses,  no organomegaly Extremities: extremities normal, atraumatic, no cyanosis or edema Neurologic: Grossly  normal  Diagnostic Studies & Laboratory data:     Recent Radiology Findings:   Mr Abdomen W Wo Contrast  Result Date: 04/21/2018 CLINICAL DATA:  Esophageal carcinoma. Suspicious lesion in the liver on FDG PET scan. MRI recommended for further characterization. EXAM: MRI ABDOMEN WITHOUT AND WITH CONTRAST TECHNIQUE: Multiplanar multisequence MR imaging of the abdomen was performed both before and after the administration of intravenous contrast. CONTRAST:  5 mL Gadavist COMPARISON:  PET-CT scan 04/11/2018 FINDINGS: Lower chest:  Lung bases are clear. Hepatobiliary: Within the central liver, attention is directed to the region of the liver adjacent the LEFT portal vein and IVC where on comparison PET-CT scan, there was a small focus of hypermetabolic activity. There is no lesion identified at this site on the noncontrast T1 and T2 weighted series or the post-contrast T1 weighted series. There is an adjacent hemangioma on image 51/1001. More distant hemangioma in the inferior RIGHT hepatic lobe on image 72/101. No lymph nodes are noted in the gastrohepatic ligament. No lymph nodes noted in the porta hepatis. Pancreas: Normal pancreatic parenchymal intensity. No ductal dilatation or inflammation. Spleen: Normal spleen. Adrenals/urinary tract: Adrenal glands and kidneys are normal. Stomach/Bowel: Stomach and limited of the small bowel is unremarkable Vascular/Lymphatic: Abdominal aortic normal caliber. No retroperitoneal periportal lymphadenopathy. Musculoskeletal: No aggressive osseous lesion IMPRESSION: 1. No hepatic lesion identified to correspond to the small focus of hypermetabolic activity on comparison PET-CT scan. PET FDG activity remains indeterminate and may represent benign vascular phenomena. 2. Two benign hemangioma are noted in the RIGHT hepatic lobe. 3. No evidence metastatic adenopathy or hepatic metastasis. Electronically Signed   By: Suzy Bouchard M.D.   On: 04/21/2018 10:04   Nm Pet Image  Initial (pi) Skull Base To Thigh  Result Date: 04/11/2018 CLINICAL DATA:  Initial treatment strategy for recently diagnosed midesophageal cancer. Current smoker. EXAM: NUCLEAR MEDICINE PET SKULL BASE TO THIGH TECHNIQUE: 6.0 mCi F-18 FDG was injected intravenously. Full-ring PET imaging was performed from the skull base to thigh after the radiotracer. CT data was obtained and used for attenuation correction and anatomic localization. Fasting blood glucose: 103 mg/dl COMPARISON:  Abdominopelvic CT 08/16/2009. FINDINGS: Mediastinal blood pool activity: SUV max 1.7 NECK: No hypermetabolic cervical lymph nodes are identified.There are no lesions of the pharyngeal mucosal space. Incidental CT findings: none CHEST: There is intense hypermetabolic activity in the midesophagus, just distal to the carina (SUV max 13.0). There is mild corresponding esophageal wall thickening in this area, but no well-defined mass. There are no hypermetabolic mediastinal, hilar or axillary lymph nodes. There is no suspicious pulmonary activity. Incidental CT findings: Mild emphysema with asymmetric blebs at the right apex. Tiny subpleural nodules in the right upper lobe on images 17/8 and 31/8. No suspicious pulmonary nodules severe coronary artery atherosclerosis with lesser involvement of the aorta and great vessels. ABDOMEN/PELVIS: There is focal hypermetabolic activity centrally in the liver adjacent to the intrahepatic portion of the main portal vein. This has an SUV max of 4.4. No clear  corresponding hepatic lesion is seen in this area on the CT images. This is indeterminate for nodal versus hepatic uptake. No other abnormal activity is seen within the liver, pancreas or adrenal glands. There is no other hypermetabolic nodal activity in the abdomen or pelvis. Incidental CT findings: Aortic and branch vessel atherosclerosis. Separate from the uptake in the porta hepatis is a stable 1.4 cm low-density lesion in the right hepatic lobe  lateral to the IVC (image 116/4), probably a hemangioma based on previous CT. There is an additional low-density lesion more posteriorly in the right hepatic lobe on image 125/4 which is also stable. SKELETON: There is no hypermetabolic activity to suggest osseous metastatic disease. Incidental CT findings: Stable sclerotic lesions in the left iliac bone on images 154 and 161 of series 4. IMPRESSION: 1. Focally intense hypermetabolic activity associated with the known mass involving midesophagus. No thoracic nodal uptake identified. 2. Small focus of activity in the porta hepatis, indeterminate for a hepatic lesion versus a lymph node, but suspicious for metastatic disease. MRI may be helpful for further evaluation. Separate hepatic lesions are unchanged from 2011 CT. 3. Tiny right upper lobe pulmonary nodules, likely benign. Electronically Signed   By: Richardean Sale M.D.   On: 04/11/2018 14:56     I have independently reviewed the above radiology studies  and reviewed the findings with the patient.   Recent Lab Findings: Lab Results  Component Value Date   WBC 5.4 05/25/2018   HGB 13.5 05/25/2018   HCT 41.2 05/25/2018   PLT 273 05/25/2018   GLUCOSE 88 05/25/2018   LDLDIRECT 112 (H) 11/26/2009   ALT 14 05/25/2018   AST 18 05/25/2018   NA 138 05/25/2018   K 4.1 05/25/2018   CL 105 05/25/2018   CREATININE 0.68 05/25/2018   BUN 6 05/25/2018   CO2 23 05/25/2018   TSH 0.925 11/26/2009   INR 1.10 05/25/2018                Cancer Staging Primary cancer of middle third of esophagus ([place]) Staging form: Esophagus - Squamous Cell Carcinoma, AJCC 8th Edition - Clinical: Stage II (cT2, cN0, cM0, L: Middle) - Signed by Ladell Pier, MD on 03/23/2018    Blood pressure demonstrated a hypertensive response to exercise.  There was no ST segment deviation noted during stress.  No T wave inversion was noted during stress.  No evidence of ischemia.  No symptoms of ischemia with  exertion.   After esophagus surgery, would consider adding statin due to coronary calcium.  No further cardiac testing panned before surgery.    Stress Findings   ECG Baseline ECG exhibits normal sinus rhythm..  Stress Findings The patient exercised following the Bruce protocol.  The patient reported no symptoms during the stress test. The patient experienced no angina during the stress test.      Assessment / Plan:   Discussed with the patient proceeding with total esophagectomy, possible transhiatal but with low threshold to proceed with abdominal right chest and left neck incisions.   I have had a detailed discussion with Aaron Wang regarding the magnitude of the surgical esophagectomy procedure as well as the risks, the expected benefits, and alternatives.  I quoted Aaron Wang 5% perioperative mortality rate and a complication rate as high as 40%.  We specifically discussed complications, which include, but were not limited to: recurrent nerve injury with possible permanent hoarseness, anastomotic leak, airway and great vessel injury, conduit ischemia, thoracic duct  leak, the inability to complete the operation via a transhiatal approach requiring a right thoracotomy,  bleeding, need for blood transfusion and the potential need for ventilator support.  Aaron Wang has had questions answered is well informed and willing to proceed.   Patient was seen by cardiology stress test was performed, patient cleared to proceed with esophagectomy from a cardiology standpoint Social worker referral to assist with social issues and making arrangements for living conditions following surgery   [name] MD      [address] [place] [address] Office [phone]   Beeper [phone]  05/29/2018 7:20 AM

## 2018-05-29 NOTE — Progress Notes (Signed)
Patient ID: TAHJI Ordway, male   DOB: Jan 07, 1956, 63 y.o.   MRN: [id number]  TCTS Evening Rounds:   Hemodynamically stable    Has started to wake up on vent. Off Precedex  Urine output good after lasix CT output low NG drainage bloody  CBC    Component Value Date/Time   WBC 5.1 05/29/2018 1513   RBC 3.69 (L) 05/29/2018 1513   HGB 11.1 (L) 05/29/2018 1513   HCT 33.7 (L) 05/29/2018 1513   PLT 206 05/29/2018 1513   MCV 91.3 05/29/2018 1513   MCH 30.1 05/29/2018 1513   MCHC 32.9 05/29/2018 1513   RDW 12.5 05/29/2018 1513   LYMPHSABS 1.1 01/04/2018 1503   MONOABS 0.4 01/04/2018 1503   EOSABS 0.1 01/04/2018 1503   BASOSABS 0.0 01/04/2018 1503     BMET    Component Value Date/Time   NA 133 (L) 05/29/2018 1513   K 4.1 05/29/2018 1513   CL 104 05/29/2018 1513   CO2 21 (L) 05/29/2018 1513   GLUCOSE 162 (H) 05/29/2018 1513   BUN 6 05/29/2018 1513   CREATININE 1.10 05/29/2018 1513   CALCIUM 8.4 (L) 05/29/2018 1513   GFRNONAA >60 05/29/2018 1513   GFRAA >60 05/29/2018 1513     A/P:  Stable postop course. Continue current plans. Wean vent to extubate.

## 2018-05-29 NOTE — Anesthesia Procedure Notes (Signed)
Procedure Name: Intubation Date/Time: 05/29/2018 7:50 AM Performed by: Jearld Pies, CRNA Pre-anesthesia Checklist: Patient identified, Emergency Drugs available, Suction available and Patient being monitored Patient Re-evaluated:Patient Re-evaluated prior to induction Oxygen Delivery Method: Circle System Utilized Preoxygenation: Pre-oxygenation with 100% oxygen Induction Type: IV induction Ventilation: Mask ventilation without difficulty Laryngoscope Size: Glidescope and 4 Grade View: Grade II Tube type: Oral Tube size: 8.0 mm Number of attempts: 1 Airway Equipment and Method: Stylet Placement Confirmation: ETT inserted through vocal cords under direct vision,  positive ETCO2 and breath sounds checked- equal and bilateral Secured at: 24 cm Tube secured with: Tape Dental Injury: Teeth and Oropharynx as per pre-operative assessment  Comments: NIMS tube inserted via Glidescope to ensure proper positioning, G2v, limited mouth opening.

## 2018-05-29 NOTE — Progress Notes (Signed)
Patient interviewed in preop by Vernie Murders, RN.

## 2018-05-29 NOTE — Brief Op Note (Addendum)
      [address]       [place] [address]             [phone]      05/29/2018  2:37 PM  PATIENT:  Aaron Wang  63 y.o. male  PRE-OPERATIVE DIAGNOSIS:  ESOPHAGEAL CANCER  POST-OPERATIVE DIAGNOSIS:  ESOPHAGEAL CANCER  PROCEDURE:  Procedure(s): TRANSHIATAL TOTAL ESOPHAGECTOMY (N/A) PYLOROMYOTOMY (N/A) FEEDING JEJUNOSTOMY (N/A) CERVICOESOPHAGO GASTROSTOMY (Right) Flexible Bronchoscopy LEFT CHEST TUBE INSERTION (Left)  SURGEON:  Surgeon(s) and Role:    * [name], MD - Primary  PHYSICIAN ASSISTANT: WAYNE GOLD PA-C  ANESTHESIA:   general  EBL:  200 mL   BLOOD ADMINISTERED:none  DRAINS: 1 31F Chest Tube(s) in the LEFT HEMITHORAX , j tube   LOCAL MEDICATIONS USED:  NONE  SPECIMEN:  Source of Specimen:  ESOPHAGOGASTRECTOMY,LN'S MESENTERIC IMPLANTS  DISPOSITION OF SPECIMEN:  PATHOLOGY  COUNTS:  YES   DICTATION: .PENDING  PLAN OF CARE: Admit to inpatient   PATIENT DISPOSITION:  ICU - intubated and hemodynamically stable.   Delay start of Pharmacological VTE agent (>24hrs) due to surgical blood loss or risk of bleeding: yes  COMPLICATIONS:NO KNOWN

## 2018-05-29 NOTE — Procedures (Signed)
Extubation Procedure Note  Patient Details:   Name: Aaron Wang DOB: May 02, 1955 MRN: [id number]   Airway Documentation:    Vent end date: 05/29/18 Vent end time: 2300   Evaluation  O2 sats: stable throughout Complications: No apparent complications Patient did tolerate procedure well. Bilateral Breath Sounds: Clear   Yes   Patient did NIF -35 and FVC 1.2L. RT extubated patient to 4L [place]. Patient is able to verbalize name.   Patsy Baltimore Sully Dyment 05/29/2018, 11:06 PM

## 2018-05-29 NOTE — Anesthesia Procedure Notes (Signed)
Arterial Line Insertion Start/End2/17/2020 7:15 AM, 05/29/2018 7:20 AM Performed by: Jearld Pies, CRNA, CRNA  Patient location: Pre-op. Preanesthetic checklist: patient identified, IV checked, site marked, risks and benefits discussed, surgical consent, monitors and equipment checked, pre-op evaluation, timeout performed and anesthesia consent Lidocaine 1% used for infiltration and patient sedated Right, radial was placed Catheter size: 20 G Hand hygiene performed , maximum sterile barriers used  and Seldinger technique used Allen's test indicative of satisfactory collateral circulation Attempts: 1 Procedure performed without using ultrasound guided technique. Following insertion, dressing applied and Biopatch. Post procedure assessment: normal  Patient tolerated the procedure well with no immediate complications.

## 2018-05-29 NOTE — Transfer of Care (Signed)
Immediate Anesthesia Transfer of Care Note  Patient: Aaron Wang  Procedure(s) Performed: TRANSHIATAL TOTAL ESOPHAGECTOMY (N/A Abdomen) PYLOROMYOTOMY (N/A ) FEEDING JEJUNOSTOMY (N/A ) CERVICOESOPHAGO GASTROSTOMY (Right Chest) Flexible Bronchoscopy ([place]) LEFT CHEST TUBE INSERTION (Left Chest)  Patient Location: ICU  Anesthesia Type:General  Level of Consciousness: Patient remains intubated per anesthesia plan  Airway & Oxygen Therapy: Patient remains intubated per anesthesia plan and Patient placed on Ventilator (see vital sign flow sheet for setting)  Post-op Assessment: Report given to RN and Post -op Vital signs reviewed and stable  Post vital signs: Reviewed and stable  Last Vitals:  Vitals Value Taken Time  BP 145/85 05/29/2018  3:13 PM  Temp    Pulse 78 05/29/2018  3:17 PM  Resp 16 05/29/2018  3:17 PM  SpO2 100 % 05/29/2018  3:17 PM  Vitals shown include unvalidated device data.  Last Pain:  Vitals:   05/29/18 0630  TempSrc:   PainSc: 6      Patient transported in stable position, Sue RT at bedside, connected to ventilator, PRVC 12RR 5 PEEP Vt 567m 100% FiO2, bilateral breath sounds present, confirmed ventilation, report to C[employer] Corporationat bedside, patient currently on Precedex gtt, VSS. Questions answered.  Patients Stated Pain Goal: 3 ([id number]  Complications: No apparent anesthesia complications

## 2018-05-30 ENCOUNTER — Encounter (HOSPITAL_COMMUNITY): Payer: Self-pay | Admitting: Cardiothoracic Surgery

## 2018-05-30 ENCOUNTER — Inpatient Hospital Stay (HOSPITAL_COMMUNITY): Payer: Medicaid Other

## 2018-05-30 LAB — POCT I-STAT 7, (LYTES, BLD GAS, ICA,H+H)
Acid-base deficit: 3 mmol/L — ABNORMAL HIGH (ref 0.0–2.0)
Acid-base deficit: 4 mmol/L — ABNORMAL HIGH (ref 0.0–2.0)
Bicarbonate: 22.1 mmol/L (ref 20.0–28.0)
Bicarbonate: 24.1 mmol/L (ref 20.0–28.0)
Calcium, Ion: 1.25 mmol/L (ref 1.15–1.40)
Calcium, Ion: 1.26 mmol/L (ref 1.15–1.40)
HCT: 35 % — ABNORMAL LOW (ref 39.0–52.0)
HCT: 36 % — ABNORMAL LOW (ref 39.0–52.0)
Hemoglobin: 11.9 g/dL — ABNORMAL LOW (ref 13.0–17.0)
Hemoglobin: 12.2 g/dL — ABNORMAL LOW (ref 13.0–17.0)
O2 Saturation: 100 %
O2 Saturation: 99 %
Patient temperature: 36.4
Patient temperature: 36.5
Potassium: 4.1 mmol/L (ref 3.5–5.1)
Potassium: 4.1 mmol/L (ref 3.5–5.1)
Sodium: 137 mmol/L (ref 135–145)
Sodium: 137 mmol/L (ref 135–145)
TCO2: 23 mmol/L (ref 22–32)
TCO2: 26 mmol/L (ref 22–32)
pCO2 arterial: 42.1 mmHg (ref 32.0–48.0)
pCO2 arterial: 47.5 mmHg (ref 32.0–48.0)
pH, Arterial: 7.311 — ABNORMAL LOW (ref 7.350–7.450)
pH, Arterial: 7.325 — ABNORMAL LOW (ref 7.350–7.450)
pO2, Arterial: 166 mmHg — ABNORMAL HIGH (ref 83.0–108.0)
pO2, Arterial: 185 mmHg — ABNORMAL HIGH (ref 83.0–108.0)

## 2018-05-30 LAB — CBC
HCT: 34.8 % — ABNORMAL LOW (ref 39.0–52.0)
Hemoglobin: 11.3 g/dL — ABNORMAL LOW (ref 13.0–17.0)
MCH: 30.3 pg (ref 26.0–34.0)
MCHC: 32.5 g/dL (ref 30.0–36.0)
MCV: 93.3 fL (ref 80.0–100.0)
Platelets: 222 10*3/uL (ref 150–400)
RBC: 3.73 MIL/uL — ABNORMAL LOW (ref 4.22–5.81)
RDW: 12.7 % (ref 11.5–15.5)
WBC: 8.5 10*3/uL (ref 4.0–10.5)
nRBC: 0 % (ref 0.0–0.2)

## 2018-05-30 LAB — BASIC METABOLIC PANEL
Anion gap: 7 (ref 5–15)
BUN: 9 mg/dL (ref 6–20)
CHLORIDE: 107 mmol/L (ref 98–111)
CO2: 22 mmol/L (ref 22–32)
Calcium: 8.1 mg/dL — ABNORMAL LOW (ref 8.9–10.3)
Creatinine, Ser: 0.89 mg/dL (ref 0.61–1.24)
GFR calc Af Amer: >60 mL/min (ref >60)
GFR calc non Af Amer: >60 mL/min (ref >60)
Glucose, Bld: 122 mg/dL — ABNORMAL HIGH (ref 70–99)
Potassium: 4 mmol/L (ref 3.5–5.1)
SODIUM: 136 mmol/L (ref 135–145)

## 2018-05-30 LAB — GLUCOSE, CAPILLARY
Glucose-Capillary: 104 mg/dL — ABNORMAL HIGH (ref 70–99)
Glucose-Capillary: 108 mg/dL — ABNORMAL HIGH (ref 70–99)
Glucose-Capillary: 124 mg/dL — ABNORMAL HIGH (ref 70–99)
Glucose-Capillary: 135 mg/dL — ABNORMAL HIGH (ref 70–99)
Glucose-Capillary: 136 mg/dL — ABNORMAL HIGH (ref 70–99)
Glucose-Capillary: 141 mg/dL — ABNORMAL HIGH (ref 70–99)

## 2018-05-30 MED ORDER — HYDROMORPHONE 1 MG/ML IV SOLN
INTRAVENOUS | Status: DC
  Administered 2018-05-30: 21:00:00 via INTRAVENOUS
  Administered 2018-05-31: 1.8 mg via INTRAVENOUS
  Administered 2018-05-31: 1.6 mg via INTRAVENOUS
  Administered 2018-05-31 (×2): 1.4 mg via INTRAVENOUS
  Administered 2018-06-01: 0.6 mg via INTRAVENOUS
  Administered 2018-06-01: 1.6 mg via INTRAVENOUS
  Administered 2018-06-01 (×2): 1.4 mg via INTRAVENOUS
  Administered 2018-06-01 – 2018-06-02 (×2): 1.6 mg via INTRAVENOUS
  Administered 2018-06-02: 25 mg via INTRAVENOUS
  Administered 2018-06-03: 0.8 mg via INTRAVENOUS
  Administered 2018-06-03: 0 mg via INTRAVENOUS
  Filled 2018-05-30 (×2): qty 25

## 2018-05-30 MED ORDER — DIPHENHYDRAMINE HCL 50 MG/ML IJ SOLN: 12.5000 mg | Freq: Four times a day (QID) | INTRAMUSCULAR | Status: DC | PRN

## 2018-05-30 MED ORDER — ORAL CARE MOUTH RINSE
15.0000 mL | Freq: Two times a day (BID) | OROMUCOSAL | Status: DC
  Administered 2018-05-30 (×3): 15 mL via OROMUCOSAL

## 2018-05-30 MED ORDER — METOPROLOL TARTRATE 12.5 MG HALF TABLET: 12.5000 mg | ORAL_TABLET | Freq: Two times a day (BID) | ORAL | Status: DC

## 2018-05-30 MED ORDER — CLONIDINE HCL 0.1 MG/24HR TD PTWK
0.1000 mg | MEDICATED_PATCH | TRANSDERMAL | Status: DC
  Administered 2018-05-30: 0.1 mg via TRANSDERMAL
  Filled 2018-05-30 (×2): qty 1

## 2018-05-30 MED ORDER — CHLORHEXIDINE GLUCONATE CLOTH 2 % EX PADS
6.0000 | MEDICATED_PAD | Freq: Every day | CUTANEOUS | Status: DC
  Administered 2018-05-30 – 2018-06-02 (×4): 6 via TOPICAL

## 2018-05-30 MED ORDER — SODIUM CHLORIDE 0.9% FLUSH
10.0000 mL | Freq: Two times a day (BID) | INTRAVENOUS | Status: DC
  Administered 2018-05-30 – 2018-06-02 (×5): 10 mL
  Administered 2018-06-03: 6 mL

## 2018-05-30 MED ORDER — SODIUM CHLORIDE 0.9% FLUSH: 10.0000 mL | INTRAVENOUS | Status: DC | PRN

## 2018-05-30 MED ORDER — ONDANSETRON HCL 4 MG/2ML IJ SOLN
4.0000 mg | Freq: Four times a day (QID) | INTRAMUSCULAR | Status: DC | PRN
  Filled 2018-05-30: qty 2

## 2018-05-30 MED ORDER — SODIUM CHLORIDE 0.9% FLUSH: 9.0000 mL | INTRAVENOUS | Status: DC | PRN

## 2018-05-30 MED ORDER — NALOXONE HCL 0.4 MG/ML IJ SOLN: 0.4000 mg | INTRAMUSCULAR | Status: DC | PRN

## 2018-05-30 MED ORDER — DIPHENHYDRAMINE HCL 12.5 MG/5ML PO ELIX: 12.5000 mg | ORAL_SOLUTION | Freq: Four times a day (QID) | ORAL | Status: DC | PRN

## 2018-05-30 MED ORDER — METOPROLOL TARTRATE 25 MG/10 ML ORAL SUSPENSION
12.5000 mg | Freq: Two times a day (BID) | ORAL | Status: DC
  Administered 2018-05-30 – 2018-05-31 (×3): 12.5 mg
  Filled 2018-05-30 (×3): qty 5

## 2018-05-30 NOTE — Progress Notes (Signed)
Patient ID: Aaron Wang, male   DOB: 22-May-1955, 63 y.o.   MRN: [id number] EVENING ROUNDS NOTE :     [address]       [place] [address]             [phone]                 1 Day Post-Op Procedure(s) (LRB): TRANSHIATAL TOTAL ESOPHAGECTOMY (N/A) PYLOROMYOTOMY (N/A) FEEDING JEJUNOSTOMY (N/A) CERVICOESOPHAGO GASTROSTOMY (Right) Flexible Bronchoscopy LEFT CHEST TUBE INSERTION (Left)  Total Length of Stay:  LOS: 1 day  BP (!) 170/85   Pulse 92   Temp 98 F (36.7 C) (Oral)   Resp 20   Ht _0  (1.702 m)   Wt 58.5 kg   SpO2 93%   BMI 20.20 kg/m   .Intake/Output      02/17 0701 - 02/18 0700 02/18 0701 - 02/19 0700   I.V. (mL/kg) 6322.2 (108.1) 1235.2 (21.1)   Other  260   IV Piggyback 1750 200.2   Total Intake(mL/kg) 8072.2 (138) 1695.4 (29)   Urine (mL/kg/hr) 1930 (1.4) 330 (0.5)   Emesis/NG output  100   Blood 425    Chest Tube 646 250   Total Output 3001 680   Net +5071.2 +1015.4          . dexmedetomidine (PRECEDEX) IV infusion Stopped (05/30/18 0540)  . dextrose 5 % and 0.9% NaCl 100 mL/hr at 05/30/18 1700  . potassium chloride       Lab Results  Component Value Date   WBC 8.5 05/30/2018   HGB 11.3 (L) 05/30/2018   HCT 34.8 (L) 05/30/2018   PLT 222 05/30/2018   GLUCOSE 122 (H) 05/30/2018   LDLDIRECT 112 (H) 11/26/2009   ALT 14 05/25/2018   AST 18 05/25/2018   NA 136 05/30/2018   K 4.0 05/30/2018   CL 107 05/30/2018   CREATININE 0.89 05/30/2018   BUN 9 05/30/2018   CO2 22 05/30/2018   TSH 0.925 11/26/2009   PSA 0.63 04/16/2010   INR 1.10 05/25/2018   MICROALBUR 0.50 01/01/2010   Stable day Change to pca pain meds   [name] MD  Beeper [phone] Office [phone] 05/30/2018 6:06 PM

## 2018-05-30 NOTE — Progress Notes (Signed)
Patient ID: Aaron Wang, male   DOB: 1955-12-19, 63 y.o.   MRN: [id number] TCTS DAILY ICU PROGRESS NOTE                   [address]            [place] [address]          [phone]   1 Day Post-Op Procedure(s) (LRB): TRANSHIATAL TOTAL ESOPHAGECTOMY (N/A) PYLOROMYOTOMY (N/A) FEEDING JEJUNOSTOMY (N/A) CERVICOESOPHAGO GASTROSTOMY (Right) Flexible Bronchoscopy LEFT CHEST TUBE INSERTION (Left)  Total Length of Stay:  LOS: 1 day   Subjective: Patient awake alert neurologically intact sitting in chair this morning, extubated last night  Objective: Vital signs in last 24 hours: Temp:  [96 F (35.6 C)-97.7 F (36.5 C)] 97.7 F (36.5 C) (02/18 0400) Pulse Rate:  [61-87] 71 (02/18 0600) Cardiac Rhythm: Normal sinus rhythm (02/18 0400) Resp:  [8-23] 11 (02/18 0600) BP: (113-178)/(74-97) 113/81 (02/18 0600) SpO2:  [100 %] 100 % (02/18 0600) Arterial Line BP: (97-197)/(59-100) 120/76 (02/18 0600) FiO2 (%):  [40 %-50 %] 40 % (02/17 2222)  Filed Weights   05/29/18 0604  Weight: 58.5 kg    Weight change:    Hemodynamic parameters for last 24 hours:    Intake/Output from previous day: 02/17 0701 - 02/18 0700 In: 7722.8 [I.V.:5972.8; IV Piggyback:1750] Out: 2676 [Urine:1885; Blood:425; Chest Tube:366]  Intake/Output this shift: No intake/output data recorded.  Current Meds: Scheduled Meds: . Chlorhexidine Gluconate Cloth  6 each Topical Daily  . insulin aspart  0-24 Units Subcutaneous Q4H  . mouth rinse  15 mL Mouth Rinse BID  . pantoprazole (PROTONIX) IV  40 mg Intravenous Q12H  . sodium chloride flush  10-40 mL Intracatheter Q12H   Continuous Infusions: . acetaminophen Stopped (05/30/18 0247)  . cefOXitin Stopped (05/30/18 0235)  . dexmedetomidine (PRECEDEX) IV infusion 0.2 mcg/kg/hr (05/30/18 0426)  . dextrose 5 % and 0.9% NaCl 125 mL/hr at 05/30/18 0518  . potassium chloride     PRN Meds:.albuterol, morphine injection, ondansetron (ZOFRAN)  IV, potassium chloride, sodium chloride flush  General appearance: alert and cooperative Neurologic: intact Heart: regular rate and rhythm, S1, S2 normal, no murmur, click, rub or gallop Lungs: diminished breath sounds bibasilar Abdomen: soft, non-tender; bowel sounds normal; no masses,  no organomegaly Extremities: extremities normal, atraumatic, no cyanosis or edema and Homans sign is negative, no sign of DVT Wound: Dressings intact  Lab Results: CBC: Recent Labs    05/29/18 1513  05/30/18 0400 05/30/18 0404  WBC 5.1  --   --  8.5  HGB 11.1*   < > 11.9* 11.3*  HCT 33.7*   < > 35.0* 34.8*  PLT 206  --   --  222   < > = values in this interval not displayed.   BMET:  Recent Labs    05/29/18 1513  05/30/18 0400 05/30/18 0404  NA 133*   < > 137 136  K 4.1   < > 4.1 4.0  CL 104  --   --  107  CO2 21*  --   --  22  GLUCOSE 162*  --   --  122*  BUN 6  --   --  9  CREATININE 1.10  --   --  0.89  CALCIUM 8.4*  --   --  8.1*   < > = values in this interval not displayed.    CMET: Lab Results  Component Value Date   WBC  8.5 05/30/2018   HGB 11.3 (L) 05/30/2018   HCT 34.8 (L) 05/30/2018   PLT 222 05/30/2018   GLUCOSE 122 (H) 05/30/2018   LDLDIRECT 112 (H) 11/26/2009   ALT 14 05/25/2018   AST 18 05/25/2018   NA 136 05/30/2018   K 4.0 05/30/2018   CL 107 05/30/2018   CREATININE 0.89 05/30/2018   BUN 9 05/30/2018   CO2 22 05/30/2018   TSH 0.925 11/26/2009   PSA 0.63 04/16/2010   INR 1.10 05/25/2018   MICROALBUR 0.50 01/01/2010      PT/INR: No results for input(s): LABPROT, INR in the last 72 hours. Radiology: Dg Chest Port 1 View  Result Date: 05/29/2018 CLINICAL DATA:  History of esophogectomy. EXAM: PORTABLE CHEST 1 VIEW COMPARISON:  May 29, 2018 FINDINGS: The heart size and mediastinal contours are stable. A breathing tube is identified with distal tip 4.6 cm from carina. A nasogastric tube is identified with distal tip probably at the GE junction. Left  chest tube is noted. Right central venous line is identified with distal tip in the superior vena cava. Both lungs are clear. There is no pneumothorax. The visualized skeletal structures are unremarkable. IMPRESSION: Life supporting devices as described. Lungs are clear. Electronically Signed   By: Abelardo Diesel M.D.   On: 05/29/2018 15:51  I have independently reviewed the above radiology studies  and reviewed the findings with the patient.    Assessment/Plan: S/P Procedure(s) (LRB): TRANSHIATAL TOTAL ESOPHAGECTOMY (N/A) PYLOROMYOTOMY (N/A) FEEDING JEJUNOSTOMY (N/A) CERVICOESOPHAGO GASTROSTOMY (Right) Flexible Bronchoscopy LEFT CHEST TUBE INSERTION (Left) Mobilize Diuresis DC A-line DC Foley and a.m., with monitoring urine output today and increasing mobility Leave chest tube today Expected Acute  Blood - loss Anemia- continue to monitor   [name] 05/30/2018 7:37 AM 1

## 2018-05-30 NOTE — Plan of Care (Signed)
  Problem: Education: Goal: Knowledge of the prescribed therapeutic regimen will improve Outcome: Progressing   Problem: Clinical Measurements: Goal: Postoperative complications will be avoided or minimized Outcome: Progressing   Problem: Respiratory: Goal: Ability to maintain a clear airway will improve Outcome: Progressing   Problem: Skin Integrity: Goal: Demonstration of wound healing without infection will improve Outcome: Progressing   Problem: Health Behavior/Discharge Planning: Goal: Ability to manage health-related needs will improve Outcome: Progressing   Problem: Clinical Measurements: Goal: Ability to maintain clinical measurements within normal limits will improve Outcome: Progressing Goal: Will remain free from infection Outcome: Progressing Goal: Respiratory complications will improve Outcome: Progressing   Problem: Activity: Goal: Risk for activity intolerance will decrease Outcome: Progressing   Problem: Safety: Goal: Ability to remain free from injury will improve Outcome: Progressing   Problem: Skin Integrity: Goal: Risk for impaired skin integrity will decrease Outcome: Progressing

## 2018-05-31 ENCOUNTER — Inpatient Hospital Stay (HOSPITAL_COMMUNITY): Payer: Medicaid Other

## 2018-05-31 DIAGNOSIS — E440 Moderate protein-calorie malnutrition: Secondary | ICD-10-CM

## 2018-05-31 LAB — GLUCOSE, CAPILLARY
Glucose-Capillary: 104 mg/dL — ABNORMAL HIGH (ref 70–99)
Glucose-Capillary: 120 mg/dL — ABNORMAL HIGH (ref 70–99)
Glucose-Capillary: 135 mg/dL — ABNORMAL HIGH (ref 70–99)
Glucose-Capillary: 90 mg/dL (ref 70–99)
Glucose-Capillary: 93 mg/dL (ref 70–99)
Glucose-Capillary: 94 mg/dL (ref 70–99)
Glucose-Capillary: 96 mg/dL (ref 70–99)

## 2018-05-31 LAB — CBC
HCT: 35.1 % — ABNORMAL LOW (ref 39.0–52.0)
Hemoglobin: 11.7 g/dL — ABNORMAL LOW (ref 13.0–17.0)
MCH: 31.1 pg (ref 26.0–34.0)
MCHC: 33.3 g/dL (ref 30.0–36.0)
MCV: 93.4 fL (ref 80.0–100.0)
Platelets: 205 10*3/uL (ref 150–400)
RBC: 3.76 MIL/uL — ABNORMAL LOW (ref 4.22–5.81)
RDW: 13.1 % (ref 11.5–15.5)
WBC: 9.7 10*3/uL (ref 4.0–10.5)
nRBC: 0 % (ref 0.0–0.2)

## 2018-05-31 LAB — BASIC METABOLIC PANEL
Anion gap: 5 (ref 5–15)
BUN: <5 mg/dL — ABNORMAL LOW (ref 6–20)
CO2: 26 mmol/L (ref 22–32)
Calcium: 8.1 mg/dL — ABNORMAL LOW (ref 8.9–10.3)
Chloride: 105 mmol/L (ref 98–111)
Creatinine, Ser: 0.7 mg/dL (ref 0.61–1.24)
GFR calc Af Amer: >60 mL/min (ref >60)
GFR calc non Af Amer: >60 mL/min (ref >60)
Glucose, Bld: 129 mg/dL — ABNORMAL HIGH (ref 70–99)
Potassium: 3.5 mmol/L (ref 3.5–5.1)
Sodium: 136 mmol/L (ref 135–145)

## 2018-05-31 LAB — MAGNESIUM: Magnesium: 1.8 mg/dL (ref 1.7–2.4)

## 2018-05-31 MED ORDER — POTASSIUM CHLORIDE 10 MEQ/50ML IV SOLN: 10.0000 meq | INTRAVENOUS | Status: DC | PRN

## 2018-05-31 MED ORDER — ORAL CARE MOUTH RINSE
15.0000 mL | Freq: Two times a day (BID) | OROMUCOSAL | Status: DC
  Administered 2018-05-31 – 2018-06-03 (×6): 15 mL via OROMUCOSAL

## 2018-05-31 MED ORDER — JEVITY 1.2 CAL PO LIQD
1000.0000 mL | ORAL | Status: DC
  Administered 2018-05-31 – 2018-06-01 (×2): 1000 mL
  Filled 2018-05-31 (×3): qty 1000

## 2018-05-31 MED ORDER — CHLORHEXIDINE GLUCONATE 0.12 % MT SOLN
15.0000 mL | Freq: Two times a day (BID) | OROMUCOSAL | Status: DC
  Administered 2018-05-31 – 2018-06-03 (×7): 15 mL via OROMUCOSAL
  Filled 2018-05-31 (×6): qty 15

## 2018-05-31 MED ORDER — POTASSIUM CHLORIDE 10 MEQ/50ML IV SOLN
10.0000 meq | INTRAVENOUS | Status: AC
  Administered 2018-05-31 (×3): 10 meq via INTRAVENOUS
  Filled 2018-05-31 (×2): qty 50

## 2018-05-31 NOTE — Anesthesia Postprocedure Evaluation (Signed)
Anesthesia Post Note  Patient: Aaron Wang  Procedure(s) Performed: TRANSHIATAL TOTAL ESOPHAGECTOMY (N/A Abdomen) PYLOROMYOTOMY (N/A ) FEEDING JEJUNOSTOMY (N/A ) CERVICOESOPHAGO GASTROSTOMY (Right Chest) Flexible Bronchoscopy (Bronchus) LEFT CHEST TUBE INSERTION (Left Chest)     Patient location during evaluation: SICU Anesthesia Type: General Level of consciousness: awake and alert Pain management: pain level controlled Vital Signs Assessment: post-procedure vital signs reviewed and stable Respiratory status: spontaneous breathing Cardiovascular status: stable Postop Assessment: no apparent nausea or vomiting Anesthetic complications: no    Last Vitals:  Vitals:   05/31/18 0100 05/31/18 0200  BP: (!) 163/84 (!) 167/88  Pulse: 84 81  Resp: 14 10  Temp:    SpO2: 94% 93%    Last Pain:  Vitals:   05/31/18 0008  TempSrc:   PainSc: [name]

## 2018-05-31 NOTE — Progress Notes (Signed)
Patient ID: Aaron Wang, male   DOB: 04-Aug-1955, 63 y.o.   MRN: [id number] TCTS DAILY ICU PROGRESS NOTE                   [address]            [place] [address]          [phone]   2 Days Post-Op Procedure(s) (LRB): TRANSHIATAL TOTAL ESOPHAGECTOMY (N/A) PYLOROMYOTOMY (N/A) FEEDING JEJUNOSTOMY (N/A) CERVICOESOPHAGO GASTROSTOMY (Right) Flexible Bronchoscopy LEFT CHEST TUBE INSERTION (Left)  Total Length of Stay:  LOS: 2 days   Subjective: Awake alert walked in the unit this morning  Objective: Vital signs in last 24 hours: Temp:  [97.9 F (36.6 C)-98.4 F (36.9 C)] 98.1 F (36.7 C) (02/19 0730) Pulse Rate:  [78-106] 89 (02/19 0800) Cardiac Rhythm: Normal sinus rhythm (02/19 0800) Resp:  [10-27] 10 (02/19 0800) BP: (146-197)/(77-99) 167/88 (02/19 0800) SpO2:  [92 %-100 %] 93 % (02/19 0800) Weight:  [60.1 kg] 60.1 kg (02/19 0500)  Filed Weights   05/29/18 0604 05/31/18 0500  Weight: 58.5 kg 60.1 kg    Weight change:    Hemodynamic parameters for last 24 hours:    Intake/Output from previous day: 02/18 0701 - 02/19 0700 In: 3132.3 [I.V.:2632.1; IV Piggyback:200.2] Out: 1330 [Urine:830; Emesis/NG output:100; Chest Tube:400]  Intake/Output this shift: Total I/O In: 149.7 [I.V.:99.9; Other:40; IV Piggyback:9.8] Out: 230 [Urine:100; Emesis/NG output:50; Chest Tube:80]  Current Meds: Scheduled Meds: . chlorhexidine  15 mL Mouth Rinse BID  . Chlorhexidine Gluconate Cloth  6 each Topical Daily  . cloNIDine  0.1 mg Transdermal Weekly  . HYDROmorphone   Intravenous Q4H  . insulin aspart  0-24 Units Subcutaneous Q4H  . mouth rinse  15 mL Mouth Rinse q12n4p  . metoprolol tartrate  12.5 mg Per Tube BID  . pantoprazole (PROTONIX) IV  40 mg Intravenous Q12H  . sodium chloride flush  10-40 mL Intracatheter Q12H   Continuous Infusions: . dexmedetomidine (PRECEDEX) IV infusion Stopped (05/30/18 0540)  . dextrose 5 % and 0.9% NaCl 100 mL/hr at  05/31/18 0800  . potassium chloride    . potassium chloride 10 mEq (05/31/18 0851)   PRN Meds:.albuterol, diphenhydrAMINE **OR** diphenhydrAMINE, morphine injection, naloxone **AND** sodium chloride flush, ondansetron (ZOFRAN) IV, ondansetron (ZOFRAN) IV, potassium chloride, sodium chloride flush  General appearance: alert, cooperative and no distress Neurologic: intact Heart: regular rate and rhythm, S1, S2 normal, no murmur, click, rub or gallop Lungs: diminished breath sounds bibasilar Abdomen: soft, non-tender; bowel sounds normal; no masses,  no organomegaly Extremities: extremities normal, atraumatic, no cyanosis or edema and Homans sign is negative, no sign of DVT Wound: Dressings intact, minimal drainage from left neck drain  Lab Results: CBC: Recent Labs    05/30/18 0404 05/31/18 0440  WBC 8.5 9.7  HGB 11.3* 11.7*  HCT 34.8* 35.1*  PLT 222 205   BMET:  Recent Labs    05/30/18 0404 05/31/18 0440  NA 136 136  K 4.0 3.5  CL 107 105  CO2 22 26  GLUCOSE 122* 129*  BUN 9 <5*  CREATININE 0.89 0.70  CALCIUM 8.1* 8.1*    CMET: Lab Results  Component Value Date   WBC 9.7 05/31/2018   HGB 11.7 (L) 05/31/2018   HCT 35.1 (L) 05/31/2018   PLT 205 05/31/2018   GLUCOSE 129 (H) 05/31/2018   LDLDIRECT 112 (H) 11/26/2009   ALT 14 05/25/2018   AST 18 05/25/2018   NA 136  05/31/2018   K 3.5 05/31/2018   CL 105 05/31/2018   CREATININE 0.70 05/31/2018   BUN <5 (L) 05/31/2018   CO2 26 05/31/2018   TSH 0.925 11/26/2009   PSA 0.63 04/16/2010   INR 1.10 05/25/2018   MICROALBUR 0.50 01/01/2010      PT/INR: No results for input(s): LABPROT, INR in the last 72 hours. Radiology: No results found.   Assessment/Plan: S/P Procedure(s) (LRB): TRANSHIATAL TOTAL ESOPHAGECTOMY (N/A) PYLOROMYOTOMY (N/A) FEEDING JEJUNOSTOMY (N/A) CERVICOESOPHAGO GASTROSTOMY (Right) Flexible Bronchoscopy LEFT CHEST TUBE INSERTION (Left) Mobilize d/c tubes/lines DC Foley DC chest  tube Start trickle j tube feeding     [name] 05/31/2018 8:54 AM

## 2018-05-31 NOTE — Progress Notes (Signed)
      [address]       [place] [address]             [phone]      POD # 2 esophagectomy  Sleeping currently BP (!) 159/99   Pulse 81   Temp 97.7 F (36.5 C) (Oral)   Resp 10   Ht 5\' 7"  (1.702 m)   Wt 60.1 kg   SpO2 93%   BMI 20.75 kg/m '  Intake/Output Summary (Last 24 hours) at 05/31/2018 1918 Last data filed at 05/31/2018 1900 Gross per 24 hour  Intake 2418.29 ml  Output 1135 ml  Net 1283.29 ml   CBG well controlled  Remo Lipps C. Roxan Hockey, MD [facility] [phone]

## 2018-05-31 NOTE — Op Note (Signed)
NAME: TODRICK, SIEDSCHLAG MEDICAL RECORD QM:[id number] ACCOUNT [id number] DATE OF BIRTH:1955-11-28 FACILITY: [facility] LOCATION: [facility] PHYSICIAN:Lidie Glade Maryruth Bun, MD  OPERATIVE REPORT  DATE OF PROCEDURE:  05/29/2018  PREOPERATIVE DIAGNOSIS:  Squamous cell carcinoma of the esophagus.  POSTOPERATIVE DIAGNOSIS:  Squamous cell carcinoma of the esophagus.  SURGICAL PROCEDURE:  Bronchoscopy, transhiatal total esophagectomy with cervical esophagogastrostomy, pyloromyotomy, feeding jejunostomy tube, placement of left chest tube.  SURGEON:  Lanelle Bal, MD  FIRST ASSISTANT:  Jadene Pierini, [place].  BRIEF HISTORY:  The patient is a 63 year old male who had been evaluated by the GI service and Medical Oncology when he began having difficulty swallowing.  He was noted to have biopsy proven squamous cell carcinoma between the mid and distal third of  the esophagus.  EUS staged this as 2.  The patient was seen by Medical Oncology and Radiation Oncology and declined preoperative treatment.  The patient was referred for consideration of primary resection.  After cardiac evaluation and also MRI of the  liver because of subtle findings on PET scan, but without evidence of metastatic disease on MRI, we decided with the patient to proceed with esophagectomy.  Risks and options were discussed with him in detail and documented.  DESCRIPTION OF PROCEDURE:  With right central line and arterial line in place, the patient underwent general endotracheal anesthesia with a single lumen NIMS tube without difficulty.  Appropriate timeout was performed.  Fiberoptic bronchoscopy was  performed to the subsegmental level in the right and left tracheobronchial tree and also with careful examination along the trachea without evidence of tracheal involvement of the mass.  The scope was removed.  The left neck, chest and abdomen were  prepped with Betadine, draped in a sterile manner.  A second timeout was performed.  We then proceeded  with an upper midline abdominal incision entering the abdominal cavity.  On exploration of the abdominal cavity, there were several areas of small what  appeared to be serosal implants.  Two of these were excised and submitted to pathology.  Frozen section showed no evidence of malignancy.  In addition, some mildly enlarged periesophageal lymph nodes were noted and prior to proceeding with resection,  these were biopsied also.  Frozen section on these initial specimens showed no evidence of malignancy and we decided to proceed with our planned procedure.  We first divided the short gastric vessels with the ____ electrocautery device, freeing up the  fundus of the stomach.  We then elevated the stomach and entered into the lesser sac, identifying the coronary vein and left gastric artery.  These were encircled with vessel loops.  A small bulldog was placed on the left gastric artery.  With this in  place, there was easily palpable pulse along the right gastroepiploic vessel.  With the greater curve of the stomach free, we then proceeded to open the esophageal hiatus and encircle the esophagus with the Penrose and then proceeded with initial  transhiatal esophagectomy under direct vision with Harmonic scalpel, dividing tissue.  We then proceeded along the esophagus and assisted through the hiatus freeing the esophagus.  A mass between the mid and distal esophagus was palpable and this was not  adherent or invading surrounding tissues and we continued with our dissection into the upper chest.  At this point, the patient's muscle paralysis had been allowed to wear off.  We then proceeded to the neck incision.  An incision was made along the  left sternocleidomastoid muscle.  The muscle was retracted laterally along with  the jugular vein and carotid artery carrying dissection down to the prevertebral fascia.  Using the NIMS stimulator, we were able to definitively locate the recurrent nerve  and encircle the  cervical esophagus with a Penrose drain.  We then proceeded with continued dissection from above and below until the esophagus was freed.  The NG tube and the esophagus was pulled back and the cervical esophagus stapled and divided with  a GIA stapler.  The specimen was brought into the abdomen.  The left gastric artery and coronary vein were divided with a vascular stapler.  The remainder of the tissue along the lesser curve of the stomach was divided and freed the stomach.  A  pyloromyotomy was then performed and closed with interrupted silk sutures.  Using serial firings of a 44 GIA stapler, the gastric margin was divided and created a gastric tube for replacement of the esophagus.  The specimen was sent to pathology.  The  pathologist froze the proximal and distal margin and were negative for tumor.  The stapled edge of the gastric tube was oversewn with a 3-0 Prolene suture.  We then placed a Foley catheter through the posterior mediastinum, placed the stomach in a  telescope bag and used the Foley to transfer the gastric tube through the posterior mediastinum into the cervical incision, taking care not to allow it to twist.  The site of the cervical anastomosis was marked on the gastric tube away from the tip of  the stomach.  We then tacked the cervical esophagus to the gastric tube, opened the stapled end of the cervical esophagus and the stomach mucosa.  Mucosal apposition was then achieved with interrupted Vicryl sutures.  A 30 mm endovascular stapler was  then placed 1 arm in the esophagus and 1 into the stomach and the posterior wall of the anastomosis was created with a stapler.  The NG tube was positioned into the gastric tube down to just below the diaphragm.  The anterior wall of the anastomosis was  then closed with interrupted Vicryl sutures and then a double layer fashion interrupted silk sutures.  The anastomosis was tucked into the posterior mediastinum.  A Penrose drain was placed in the  vicinity of the anastomosis and brought out through a  separate site and then just posterior to the neck incision.  We then returned to the abdominal incision.  We closed the esophageal hiatus to prevent any herniation of bowel contents through the hiatus.  A left chest tube was placed.  The proximal jejunum  was identified and a red rubber catheter with extra holes cut was Witzeled into the small bowel to serve as a feeding jejunostomy tube.  This was brought out through a separate site in the left abdominal wall.  The abdominal cavity was irrigated.  The  abdomen was then closed with running 2-0 PDS suture, running 2-0 Vicryl and a 3-0 subcuticular stitch.  The neck incision was closed with interrupted 3-0 Vicryls in the fascial layer and a 3-0 subcuticular stitch in the skin edges.  Dry dressings were  applied.  Sponge and needle count was reported as correct at completion of the procedure.  RF scanning for tags reported clear code.  Estimated blood loss approximately 200 mL.  The patient tolerated the procedure without obvious complication.  He was  transferred to the surgical intensive care unit for postoperative care and for weaning and extubating as tolerated.  TN/NUANCE  D:05/31/2018 T:05/31/2018 JOB:[id number]

## 2018-05-31 NOTE — Progress Notes (Signed)
Initial Nutrition Assessment  DOCUMENTATION CODES:   Non-severe (moderate) malnutrition in context of chronic illness  INTERVENTION:  Start trickle feed at 66ml/hr, advancing as tolerated 101ml q 4 hours until goal rate has been reached.  Jevity 1.2 at goal rate of 10ml/hr which provides 1872 kcal, 86.6 g protein, and 1259 ml free water.  NUTRITION DIAGNOSIS:   Moderate Malnutrition related to chronic illness(esophageal cancer) as evidenced by mild fat depletion, mild muscle depletion, moderate fat depletion  GOAL:   Patient will meet greater than or equal to 90% of their needs  MONITOR:   TF tolerance, Diet advancement, I & O's, Labs  REASON FOR ASSESSMENT:   Consult Enteral/tube feeding initiation and management, Assessment of nutrition requirement/status, Diet education  ASSESSMENT:   63 year old male w/ PMH of HTN, GERD, and esophageal cancer. On 2/17 he underwent a thoracotomy, pyloroplasty, complete esophagectomy, and had a feeding jejunostomy placed.   Spoke with patient at bedside who was in good spirits.  Patient reported that he has had decreased appetite for at least 2 months now. A normal day includes breakfast which can be eggs, oatmeal, or toast. He enjoys baked or boiled chicken for lunch and dinner, or baked fish with mixed vegetables. Pt also reports that he has 2-3 Ensures a day since eating can cause pain.   Pt reports a UBW of 120# and says he has always been smaller. He endorses wt loss because of the inability to eat. Per pt chart he was consistently staying around 118-120# but now he is at 132.5#. However, NFPE did reveal mild to moderate depletions. Patient meets criteria for malnutrition d/t depletions.   Provided patient with handout "Esophageal Surgery" from the [employer]. Educated about smaller meals more frequently, not having beverages with meals but before / after, and chewing foods more thoroughly. Patient reported that he  has had previous education done by a RD. Teach back method used, expecting fair to good compliance.   Medications reviewed and include: insulin aspart 0-24 units q 4 hrs, protonix IV Dex NaCl 135ml/hr Labs reviewed.  NUTRITION - FOCUSED PHYSICAL EXAM:    Most Recent Value  Orbital Region  Mild depletion  Upper Arm Region  Moderate depletion  Thoracic and Lumbar Region  Moderate depletion  Buccal Region  No depletion  Temple Region  Mild depletion  Clavicle Bone Region  Mild depletion  Clavicle and Acromion Bone Region  Mild depletion  Scapular Bone Region  Mild depletion  Dorsal Hand  No depletion  Patellar Region  Mild depletion  Anterior Thigh Region  No depletion  Posterior Calf Region  No depletion  Edema (RD Assessment)  None  Hair  Reviewed  Eyes  Reviewed  Mouth  Reviewed  Skin  Reviewed  Nails  Reviewed       Diet Order:   Diet Order            Diet NPO time specified  Diet effective now              EDUCATION NEEDS:   Education needs have been addressed  Skin:  Skin Assessment: Skin Integrity Issues: Skin Integrity Issues:: Incisions Incisions: abdomen, neck  Last BM:     Height:   Ht Readings from Last 1 Encounters:  05/29/18 5\' 7"  (1.702 m)    Weight:   Wt Readings from Last 1 Encounters:  05/31/18 60.1 kg    Ideal Body Weight:  67.27 kg  BMI:  Body mass  index is 20.75 kg/m.  Estimated Nutritional Needs:   Kcal:  1800-2000 kcal  Protein:  85-95 g  Fluid:  >/= 1.8L    [employer] Dietetic Intern

## 2018-06-01 ENCOUNTER — Inpatient Hospital Stay (HOSPITAL_COMMUNITY): Payer: Medicaid Other

## 2018-06-01 LAB — CBC
HCT: 33.7 % — ABNORMAL LOW (ref 39.0–52.0)
Hemoglobin: 11 g/dL — ABNORMAL LOW (ref 13.0–17.0)
MCH: 30.3 pg (ref 26.0–34.0)
MCHC: 32.6 g/dL (ref 30.0–36.0)
MCV: 92.8 fL (ref 80.0–100.0)
Platelets: 208 10*3/uL (ref 150–400)
RBC: 3.63 MIL/uL — ABNORMAL LOW (ref 4.22–5.81)
RDW: 12.8 % (ref 11.5–15.5)
WBC: 8.7 10*3/uL (ref 4.0–10.5)
nRBC: 0 % (ref 0.0–0.2)

## 2018-06-01 LAB — BASIC METABOLIC PANEL
Anion gap: 7 (ref 5–15)
BUN: <5 mg/dL — ABNORMAL LOW (ref 6–20)
CO2: 24 mmol/L (ref 22–32)
Calcium: 8.1 mg/dL — ABNORMAL LOW (ref 8.9–10.3)
Chloride: 103 mmol/L (ref 98–111)
Creatinine, Ser: 0.64 mg/dL (ref 0.61–1.24)
GFR calc Af Amer: >60 mL/min (ref >60)
GFR calc non Af Amer: >60 mL/min (ref >60)
Glucose, Bld: 105 mg/dL — ABNORMAL HIGH (ref 70–99)
Potassium: 3.2 mmol/L — ABNORMAL LOW (ref 3.5–5.1)
Sodium: 134 mmol/L — ABNORMAL LOW (ref 135–145)

## 2018-06-01 LAB — GLUCOSE, CAPILLARY
Glucose-Capillary: 102 mg/dL — ABNORMAL HIGH (ref 70–99)
Glucose-Capillary: 103 mg/dL — ABNORMAL HIGH (ref 70–99)
Glucose-Capillary: 114 mg/dL — ABNORMAL HIGH (ref 70–99)
Glucose-Capillary: 118 mg/dL — ABNORMAL HIGH (ref 70–99)
Glucose-Capillary: 128 mg/dL — ABNORMAL HIGH (ref 70–99)
Glucose-Capillary: 133 mg/dL — ABNORMAL HIGH (ref 70–99)
Glucose-Capillary: 146 mg/dL — ABNORMAL HIGH (ref 70–99)

## 2018-06-01 MED ORDER — CLONIDINE HCL 0.2 MG/24HR TD PTWK: 0.2000 mg | MEDICATED_PATCH | TRANSDERMAL | Status: DC

## 2018-06-01 MED ORDER — SODIUM CHLORIDE 0.9 % IV SOLN
INTRAVENOUS | Status: DC
  Administered 2018-06-01 – 2018-06-02 (×2): via INTRAVENOUS

## 2018-06-01 MED ORDER — METOPROLOL TARTRATE 25 MG/10 ML ORAL SUSPENSION
25.0000 mg | Freq: Two times a day (BID) | ORAL | Status: DC
  Administered 2018-06-01 – 2018-06-02 (×3): 25 mg
  Filled 2018-06-01 (×3): qty 10

## 2018-06-01 MED ORDER — CLONIDINE HCL 0.2 MG/24HR TD PTWK
0.2000 mg | MEDICATED_PATCH | TRANSDERMAL | Status: DC
  Administered 2018-06-01: 0.2 mg via TRANSDERMAL
  Filled 2018-06-01: qty 1

## 2018-06-01 NOTE — Progress Notes (Signed)
Nutrition Follow-up  DOCUMENTATION CODES:   Non-severe (moderate) malnutrition in context of chronic illness  INTERVENTION:  Advance TF as tolerated 11ml q 4 hours until goal rate has been reached.  Jevity 1.2 at goal rate of 56ml/hr which provides 1872 kcal, 86.6 g protein, and 1259 ml free water  NUTRITION DIAGNOSIS:   Moderate Malnutrition related to chronic illness(esophageal cancer) as evidenced by mild fat depletion, mild muscle depletion, moderate fat depletion  Ongoing  GOAL:   Patient will meet greater than or equal to 90% of their needs  Progressing  MONITOR:   TF tolerance, Diet advancement, I & O's, Labs  ASSESSMENT:   63 year old male w/ PMH of HTN, GERD, and esophageal cancer. On 2/17 he underwent a thoracotomy, pyloroplasty, complete esophagectomy, and had a feeding jejunostomy placed.   Spoke with pt at bedside who was very pleasant today. He reports no diarrhea or constipation but had some nausea today. Took 3 laps around the unit. Overall pt is progressing well.   Pt reports feeds were stopped last night d/t pain at the Jtube site. Tube feeding was resumed earlier this morning. Spoke w/ RN who said she will review the TF order and increase the feeds.  Medications reviewed and include: Protonix, IV NaCl 18ml/hr Labs reviewed: Na 134 (L), K 3.2 (L)   Diet Order:   Diet Order            Diet NPO time specified  Diet effective now              EDUCATION NEEDS:   Education needs have been addressed  Skin:  Skin Assessment: Skin Integrity Issues: Skin Integrity Issues:: Incisions Incisions: abdomen, neck  Last BM:  2/20  Height:   Ht Readings from Last 1 Encounters:  05/29/18 5\' 7"  (1.702 m)    Weight:   Wt Readings from Last 1 Encounters:  06/01/18 60.7 kg    Ideal Body Weight:  67.27 kg  BMI:  Body mass index is 20.96 kg/m.  Estimated Nutritional Needs:   Kcal:  1800-2000 kcal  Protein:  85-95 g  Fluid:  >/=  1.8L    [employer] Dietetic Intern

## 2018-06-01 NOTE — Progress Notes (Signed)
CT surgery p.m. Rounds  Patient comfortable sitting up in chair Walked 5 laps in the hallway No nausea today Stable on Jevity tube feeds

## 2018-06-01 NOTE — Progress Notes (Signed)
Patient ID: Aaron Wang, male   DOB: 05/09/1955, 63 y.o.   MRN: [id number] TCTS DAILY ICU PROGRESS NOTE                   [address]            [place] [address]          [phone]   3 Days Post-Op Procedure(s) (LRB): TRANSHIATAL TOTAL ESOPHAGECTOMY (N/A) PYLOROMYOTOMY (N/A) FEEDING JEJUNOSTOMY (N/A) CERVICOESOPHAGO GASTROSTOMY (Right) Flexible Bronchoscopy LEFT CHEST TUBE INSERTION (Left)  Total Length of Stay:  LOS: 3 days   Subjective: Up to chair   Objective: Vital signs in last 24 hours: Temp:  [97.7 F (36.5 C)-98.5 F (36.9 C)] 98.2 F (36.8 C) (02/20 0752) Pulse Rate:  [72-108] 78 (02/20 0600) Cardiac Rhythm: Normal sinus rhythm (02/20 0400) Resp:  [7-31] 12 (02/20 0600) BP: (159-205)/(79-134) 166/97 (02/20 0600) SpO2:  [91 %-97 %] 93 % (02/20 0600) FiO2 (%):  [36 %] 36 % (02/20 0017) Weight:  [60.7 kg] 60.7 kg (02/20 0600)  Filed Weights   05/29/18 0604 05/31/18 0500 06/01/18 0600  Weight: 58.5 kg 60.1 kg 60.7 kg    Weight change: 0.6 kg   Hemodynamic parameters for last 24 hours:    Intake/Output from previous day: 02/19 0701 - 02/20 0700 In: 2282.1 [I.V.:1858.8; NG/GT:158.7; IV Piggyback:84.6] Out: 965 [Urine:735; Emesis/NG output:100; Chest Tube:130]  Intake/Output this shift: No intake/output data recorded.  Current Meds: Scheduled Meds: . chlorhexidine  15 mL Mouth Rinse BID  . Chlorhexidine Gluconate Cloth  6 each Topical Daily  . cloNIDine  0.1 mg Transdermal Weekly  . feeding supplement (JEVITY 1.2 CAL)  1,000 mL Per Tube Q24H  . HYDROmorphone   Intravenous Q4H  . insulin aspart  0-24 Units Subcutaneous Q4H  . mouth rinse  15 mL Mouth Rinse q12n4p  . metoprolol tartrate  12.5 mg Per Tube BID  . pantoprazole (PROTONIX) IV  40 mg Intravenous Q12H  . sodium chloride flush  10-40 mL Intracatheter Q12H   Continuous Infusions: . dexmedetomidine (PRECEDEX) IV infusion Stopped (05/30/18 0540)  . dextrose 5 % and 0.9%  NaCl 75 mL/hr at 06/01/18 0700  . potassium chloride 10 mEq (06/01/18 0728)   PRN Meds:.diphenhydrAMINE **OR** diphenhydrAMINE, morphine injection, naloxone **AND** sodium chloride flush, ondansetron (ZOFRAN) IV, ondansetron (ZOFRAN) IV, potassium chloride, sodium chloride flush  General appearance: alert and cooperative Neurologic: intact Heart: regular rate and rhythm, S1, S2 normal, no murmur, click, rub or gallop Lungs: diminished breath sounds bibasilar Abdomen: soft, non-tender; bowel sounds normal; no masses,  no organomegaly Extremities: extremities normal, atraumatic, no cyanosis or edema Wound: dressing intact  Lab Results: CBC: Recent Labs    05/31/18 0440 06/01/18 0358  WBC 9.7 8.7  HGB 11.7* 11.0*  HCT 35.1* 33.7*  PLT 205 208   BMET:  Recent Labs    05/31/18 0440 06/01/18 0358  NA 136 134*  K 3.5 3.2*  CL 105 103  CO2 26 24  GLUCOSE 129* 105*  BUN <5* <5*  CREATININE 0.70 0.64  CALCIUM 8.1* 8.1*    CMET: Lab Results  Component Value Date   WBC 8.7 06/01/2018   HGB 11.0 (L) 06/01/2018   HCT 33.7 (L) 06/01/2018   PLT 208 06/01/2018   GLUCOSE 105 (H) 06/01/2018   LDLDIRECT 112 (H) 11/26/2009   ALT 14 05/25/2018   AST 18 05/25/2018   NA 134 (L) 06/01/2018   K 3.2 (L) 06/01/2018   CL  103 06/01/2018   CREATININE 0.64 06/01/2018   BUN <5 (L) 06/01/2018   CO2 24 06/01/2018   TSH 0.925 11/26/2009   PSA 0.63 04/16/2010   INR 1.10 05/25/2018   MICROALBUR 0.50 01/01/2010      PT/INR: No results for input(s): LABPROT, INR in the last 72 hours. Radiology: No results found.   Assessment/Plan: S/P Procedure(s) (LRB): TRANSHIATAL TOTAL ESOPHAGECTOMY (N/A) PYLOROMYOTOMY (N/A) FEEDING JEJUNOSTOMY (N/A) CERVICOESOPHAGO GASTROSTOMY (Right) Flexible Bronchoscopy LEFT CHEST TUBE INSERTION (Left) Mobilize Increase catapres patch for bp elevation, on beta blocker per j tube  Discussed path with patient  Cancer Staging Primary cancer of middle third  of esophagus ([place]) Staging form: Esophagus - Squamous Cell Carcinoma, AJCC 8th Edition - Clinical: Stage II (cT2, cN0, cM0, L: Middle) - Signed by Ladell Pier, MD on 03/23/2018 - Pathologic stage from 05/31/2018: Stage IIA (pT2, pN0, cM0, G2, L: Middle) - Signed by [name], MD on 05/31/2018  Resume tube feeding  D/c central line   [name] 06/01/2018 8:06 AM

## 2018-06-01 NOTE — Progress Notes (Signed)
Spoke with Aaron Wang and she is aware of the DC central line order

## 2018-06-02 ENCOUNTER — Inpatient Hospital Stay (HOSPITAL_COMMUNITY): Payer: Medicaid Other

## 2018-06-02 LAB — CBC
HCT: 32.9 % — ABNORMAL LOW (ref 39.0–52.0)
Hemoglobin: 10.8 g/dL — ABNORMAL LOW (ref 13.0–17.0)
MCH: 29.9 pg (ref 26.0–34.0)
MCHC: 32.8 g/dL (ref 30.0–36.0)
MCV: 91.1 fL (ref 80.0–100.0)
Platelets: 223 10*3/uL (ref 150–400)
RBC: 3.61 MIL/uL — ABNORMAL LOW (ref 4.22–5.81)
RDW: 12.6 % (ref 11.5–15.5)
WBC: 6.3 10*3/uL (ref 4.0–10.5)
nRBC: 0 % (ref 0.0–0.2)

## 2018-06-02 LAB — BASIC METABOLIC PANEL
Anion gap: 7 (ref 5–15)
BUN: <5 mg/dL — ABNORMAL LOW (ref 6–20)
CO2: 25 mmol/L (ref 22–32)
Calcium: 8.2 mg/dL — ABNORMAL LOW (ref 8.9–10.3)
Chloride: 103 mmol/L (ref 98–111)
Creatinine, Ser: 0.66 mg/dL (ref 0.61–1.24)
GFR calc Af Amer: >60 mL/min (ref >60)
GFR calc non Af Amer: >60 mL/min (ref >60)
Glucose, Bld: 85 mg/dL (ref 70–99)
Potassium: 3.3 mmol/L — ABNORMAL LOW (ref 3.5–5.1)
Sodium: 135 mmol/L (ref 135–145)

## 2018-06-02 LAB — GLUCOSE, CAPILLARY
Glucose-Capillary: 108 mg/dL — ABNORMAL HIGH (ref 70–99)
Glucose-Capillary: 108 mg/dL — ABNORMAL HIGH (ref 70–99)
Glucose-Capillary: 121 mg/dL — ABNORMAL HIGH (ref 70–99)
Glucose-Capillary: 129 mg/dL — ABNORMAL HIGH (ref 70–99)
Glucose-Capillary: 94 mg/dL (ref 70–99)
Glucose-Capillary: 96 mg/dL (ref 70–99)

## 2018-06-02 MED ORDER — JEVITY 1.2 CAL PO LIQD
1000.0000 mL | ORAL | Status: DC
  Administered 2018-06-02 – 2018-06-05 (×4): 1000 mL
  Filled 2018-06-02 (×5): qty 1000

## 2018-06-02 MED ORDER — METOPROLOL TARTRATE 25 MG/10 ML ORAL SUSPENSION
100.0000 mg | Freq: Two times a day (BID) | ORAL | Status: DC
  Administered 2018-06-03 – 2018-06-04 (×3): 100 mg
  Filled 2018-06-02 (×5): qty 40

## 2018-06-02 MED ORDER — HYDRALAZINE HCL 20 MG/ML IJ SOLN
10.0000 mg | Freq: Four times a day (QID) | INTRAMUSCULAR | Status: DC | PRN
  Administered 2018-06-02: 5 mg via INTRAVENOUS
  Filled 2018-06-02 (×2): qty 1

## 2018-06-02 MED ORDER — HYDRALAZINE HCL 20 MG/ML IJ SOLN
5.0000 mg | Freq: Four times a day (QID) | INTRAMUSCULAR | Status: DC | PRN
  Administered 2018-06-02: 5 mg via INTRAVENOUS
  Filled 2018-06-02: qty 1

## 2018-06-02 MED ORDER — POTASSIUM CHLORIDE 20 MEQ/15ML (10%) PO SOLN
40.0000 meq | Freq: Once | ORAL | Status: AC
  Administered 2018-06-02: 40 meq
  Filled 2018-06-02: qty 30

## 2018-06-02 MED ORDER — HYDRALAZINE HCL 20 MG/ML IJ SOLN
10.0000 mg | Freq: Once | INTRAMUSCULAR | Status: AC
  Administered 2018-06-02: 10 mg via INTRAVENOUS

## 2018-06-02 MED ORDER — METOPROLOL TARTRATE 25 MG/10 ML ORAL SUSPENSION
50.0000 mg | Freq: Two times a day (BID) | ORAL | Status: DC
  Administered 2018-06-02: 50 mg
  Filled 2018-06-02: qty 20

## 2018-06-02 MED ORDER — FUROSEMIDE 10 MG/ML IJ SOLN
40.0000 mg | Freq: Once | INTRAMUSCULAR | Status: AC
  Administered 2018-06-02: 40 mg via INTRAVENOUS
  Filled 2018-06-02: qty 4

## 2018-06-02 MED ORDER — HYDRALAZINE HCL 20 MG/ML IJ SOLN
20.0000 mg | INTRAMUSCULAR | Status: DC | PRN
  Administered 2018-06-02 – 2018-06-03 (×4): 20 mg via INTRAVENOUS
  Filled 2018-06-02 (×4): qty 1

## 2018-06-02 NOTE — Care Management Note (Addendum)
Case Management Note  Patient Details  Name: Aaron Wang MRN: [id number] Date of Birth: [birth year]  Subjective/Objective: 63 yo male presented for a total esophagectomy, jejunostomy, pyloromyotomy on 05/29/18.              Action/Plan: CM met with patient to discuss dispositional needs. PCP verified as: Dr. Nolene Ebbs. Patient states being independent PTA and had been living in a motel for 3 years; patients significant other passed away 03/22/18 and he has since been living at home alone and verbalized he has no family living locally to assist with his care post transition. Patient is concerned about his living situation, increased weakness and possibly his inability to safety manage all of his needs. CSW consulted to assist with LT placement needs. Patient has a gastrograffin swallow scheduled on 2/24, with CM team to continue to follow.   Expected Discharge Date:                  Expected Discharge Plan:  Skilled Nursing Facility  In-House Referral:  Clinical Social Work  Discharge planning Services  CM Consult  Post Acute Care Choice:  NA Choice offered to:  NA  DME Arranged:  N/A DME Agency:  NA  HH Arranged:  NA HH Agency:  NA  Status of Service:  In process, will continue to follow  If discussed at Long Length of Stay Meetings, dates discussed:    Additional Comments: 06/13/18 @ 1515-Elena Davia RNCM-CM continuing to follow as the CSW addresses dispositional options. The CSW was consulted to address patients concerns about his living situation and being able to safety manage all of his needs post discharge. Will continue to follow progress.  Midge Minium RN, BSN, NCM-BC, ACM-RN [phone] 06/02/2018, 3:50 PM

## 2018-06-02 NOTE — Plan of Care (Signed)
  Problem: Education: Goal: Knowledge of the prescribed therapeutic regimen will improve Outcome: Progressing   Problem: Bowel/Gastric: Goal: Gastrointestinal status for postoperative course will improve Outcome: Progressing   Problem: Nutritional: Goal: Ability to achieve adequate nutritional intake will improve Outcome: Progressing   Problem: Clinical Measurements: Goal: Postoperative complications will be avoided or minimized Outcome: Progressing   Problem: Respiratory: Goal: Ability to maintain a clear airway will improve Outcome: Progressing   Problem: Clinical Measurements: Goal: Ability to maintain clinical measurements within normal limits will improve Outcome: Progressing Goal: Will remain free from infection Outcome: Progressing Goal: Cardiovascular complication will be avoided Outcome: Progressing   Problem: Nutrition: Goal: Adequate nutrition will be maintained Outcome: Progressing   Problem: Coping: Goal: Level of anxiety will decrease Outcome: Progressing

## 2018-06-02 NOTE — Progress Notes (Signed)
Patient ID: Aaron Wang, male   DOB: Apr 01, 1956, 63 y.o.   MRN: [id number] TCTS Evening Rounds:  Hypertensive with MAP 100-120 today. On clonidine patch, Lopressor and hydralazine. I have increased Lopressor to 100 bid and hydralazine prn to 20 q 4.  sats 93% on 10L HFNC. Was on 3L this am. Will repeat CXR in am. May need some diuresis. Excellent UO today.

## 2018-06-02 NOTE — Progress Notes (Signed)
Nutrition Follow-up  DOCUMENTATION CODES:   Non-severe (moderate) malnutrition in context of chronic illness  INTERVENTION:  Advance TF as tolerated 22ml q 4 hours until goal rate has been reached.  Jevity 1.2 at goal rate of 18ml/hr which provides 1872 kcal, 86.6 g protein, and 1259 ml free water  NUTRITION DIAGNOSIS:   Moderate Malnutrition related to chronic illness(esophageal cancer) as evidenced by mild fat depletion, mild muscle depletion, moderate fat depletion.  Ongoing  GOAL:   Patient will meet greater than or equal to 90% of their needs  Unmet  MONITOR:   TF tolerance, Diet advancement, I & O's, Labs  REASON FOR ASSESSMENT:   Consult Enteral/tube feeding initiation and management, Assessment of nutrition requirement/status, Diet education  ASSESSMENT:   63 year old male w/ PMH of HTN, GERD, and esophageal cancer. On 2/17 he underwent a thoracotomy, pyloroplasty, complete esophagectomy, and had a feeding jejunostomy placed.   Spoke with pt at bedside. Pt denies any n/v/ or constipation/diarrhea. Pt also denies any abdominal pain, gas or bloating. TF currently running at 37ml/hr.  Is still ambulating well; walked the halls earlier this afternoon. Spoke with RN who was not aware of any advances in TF; will try to increase as tolerable.  Medications reviewed and include: insulin aspart 0-24 units, protonix, IV NaCl 61ml/hr, IV Dextrose 34ml/hr Labs reviewed: K 3.3 (L)  Diet Order:   Diet Order            Diet NPO time specified  Diet effective now              EDUCATION NEEDS:   Education needs have been addressed  Skin:  Skin Assessment: Skin Integrity Issues: Skin Integrity Issues:: Incisions Incisions: abdomen, neck  Last BM:  2/20  Height:   Ht Readings from Last 1 Encounters:  05/29/18 5\' 7"  (1.702 m)    Weight:   Wt Readings from Last 1 Encounters:  06/01/18 60.7 kg    Ideal Body Weight:  67.27 kg  BMI:  Body mass index is  20.96 kg/m.  Estimated Nutritional Needs:   Kcal:  1800-2000 kcal  Protein:  85-95 g  Fluid:  >/= 1.8L    [employer] Dietetic Intern

## 2018-06-02 NOTE — Progress Notes (Addendum)
[address]       [place] [address]             [phone]      4 Days Post-Op Procedure(s) (LRB): TRANSHIATAL TOTAL ESOPHAGECTOMY (N/A) PYLOROMYOTOMY (N/A) FEEDING JEJUNOSTOMY (N/A) CERVICOESOPHAGO GASTROSTOMY (Right) Flexible Bronchoscopy LEFT CHEST TUBE INSERTION (Left) Subjective: Feels pretty well, denies flatus , doesn't feel bloated  Objective: Vital signs in last 24 hours: Temp:  [97.8 F (36.6 C)-98.4 F (36.9 C)] 98.2 F (36.8 C) (02/21 0400) Pulse Rate:  [60-106] 78 (02/21 0400) Cardiac Rhythm: Normal sinus rhythm (02/20 2000) Resp:  [8-29] 13 (02/21 0444) BP: (152-199)/(86-106) 171/86 (02/21 0400) SpO2:  [88 %-100 %] 97 % (02/21 0444) FiO2 (%):  [0 %] 0 % (02/20 1635)  Hemodynamic parameters for last 24 hours:    Intake/Output from previous day: 02/20 0701 - 02/21 0700 In: 1241.9 [I.V.:819.9; NG/GT:300; IV Piggyback:122.1] Out: 475 [Urine:475] Intake/Output this shift: No intake/output data recorded.  General appearance: alert, cooperative and no distress Heart: regular rate and rhythm Lungs: min dim in bases Abdomen: scant BS, mild distension Extremities: no edema Wound: dressings intact  Lab Results: Recent Labs    06/01/18 0358 06/02/18 0317  WBC 8.7 6.3  HGB 11.0* 10.8*  HCT 33.7* 32.9*  PLT 208 223   BMET:  Recent Labs    06/01/18 0358 06/02/18 0317  NA 134* 135  K 3.2* 3.3*  CL 103 103  CO2 24 25  GLUCOSE 105* 85  BUN <5* <5*  CREATININE 0.64 0.66  CALCIUM 8.1* 8.2*    PT/INR: No results for input(s): LABPROT, INR in the last 72 hours. ABG    Component Value Date/Time   PHART 7.311 (L) 05/30/2018 0400   HCO3 24.1 05/30/2018 0400   TCO2 26 05/30/2018 0400   ACIDBASEDEF 3.0 (H) 05/30/2018 0400   O2SAT 100.0 05/30/2018 0400   CBG (last 3)  Recent Labs    06/01/18 1953 06/01/18 2235 06/02/18 0440  GLUCAP 128* 146* 96    Meds Scheduled Meds: . chlorhexidine  15 mL Mouth Rinse BID  .  Chlorhexidine Gluconate Cloth  6 each Topical Daily  . cloNIDine  0.2 mg Transdermal Weekly  . feeding supplement (JEVITY 1.2 CAL)  1,000 mL Per Tube Q24H  . HYDROmorphone   Intravenous Q4H  . insulin aspart  0-24 Units Subcutaneous Q4H  . mouth rinse  15 mL Mouth Rinse q12n4p  . metoprolol tartrate  25 mg Per Tube BID  . pantoprazole (PROTONIX) IV  40 mg Intravenous Q12H  . sodium chloride flush  10-40 mL Intracatheter Q12H   Continuous Infusions: . sodium chloride 10 mL/hr at 06/01/18 1900  . dexmedetomidine (PRECEDEX) IV infusion Stopped (05/30/18 0540)  . dextrose 5 % and 0.9% NaCl 75 mL/hr at 06/02/18 0658  . potassium chloride Stopped (06/01/18 0956)   PRN Meds:.diphenhydrAMINE **OR** diphenhydrAMINE, morphine injection, naloxone **AND** sodium chloride flush, ondansetron (ZOFRAN) IV, ondansetron (ZOFRAN) IV, potassium chloride, sodium chloride flush  Xrays Dg Chest Port 1 View  Result Date: 06/01/2018 CLINICAL DATA:  Esophagectomy. Shortness of breath. EXAM: PORTABLE CHEST 1 VIEW COMPARISON:  05/31/2018. FINDINGS: Unchanged cardiomediastinal silhouette. Nasogastric tube tip remains in the upper abdomen. LEFT chest tube has been removed. There is a new small LEFT apical pneumothorax, less than 5%. LEFT pleural effusion. Central venous catheter tip remains in the SVC. Bibasilar opacities are mildly increased. IMPRESSION: LEFT chest tube removed, with small LEFT apical pneumothorax, less than 5%. Slight  worsening aeration. Continued surveillance is warranted. Electronically Signed   By: Staci Righter M.D.   On: 06/01/2018 09:01   Dg Chest Port 1 View  Result Date: 05/31/2018 CLINICAL DATA:  Chest tube on the left EXAM: PORTABLE CHEST 1 VIEW COMPARISON:  Yesterday FINDINGS: Left basal chest tube is unchanged. No pneumothorax or increasing pleural fluid. Equivocal for pneumomediastinum. Right IJ line with tip at the upper cavoatrial junction. The orogastric tube tip is just below the  diaphragm. Airspace opacity with air bronchograms on the right more than left. Small right pleural effusion. Surgical drain at the left apex. IMPRESSION: 1. Stable hardware positioning. 2. Unchanged collapse and/or consolidation of the right more than left lower lobes. Electronically Signed   By: Monte Fantasia M.D.   On: 05/31/2018 09:56    Assessment/Plan: S/P Procedure(s) (LRB): TRANSHIATAL TOTAL ESOPHAGECTOMY (N/A) PYLOROMYOTOMY (N/A) FEEDING JEJUNOSTOMY (N/A) CERVICOESOPHAGO GASTROSTOMY (Right) Flexible Bronchoscopy LEFT CHEST TUBE INSERTION (Left)  1 remains hypertensive, on clonidine and betablocker, may need to increase dose, was on lisinopril- HCTZ combo preop. In sinus rhythm. Will add prn hydralizine 2 sats good on 2 liters, cont pulm toilet 3 clinical ileus- pretty asymptomatic- limit narcs as feasible and cont to mobilize as able 4 advance TF's as able 5 H/H pretty stable- monitor 6 no leukocytosis or fevers 7 replace K+ 8 creat normal, BUN low 9 BS well controlled 10 swallow study next week  LOS: 4 days    [name] PA-C 06/02/2018 Pager [phone]  Left neck drain with minimal drainage D/c ng 1-2 days  gastrograffin swallow Monday Advance tube feeding  Slowly  Cancer Staging Primary cancer of middle third of esophagus ([place]) Staging form: Esophagus - Squamous Cell Carcinoma, AJCC 8th Edition - Clinical: Stage II (cT2, cN0, cM0, L: Middle) - Signed by Ladell Pier, MD on 03/23/2018 - Pathologic stage from 05/31/2018: Stage IIA (pT2, pN0, cM0, G2, L: Middle) - Signed by [name], MD on 05/31/2018  path reviewed with patient

## 2018-06-03 ENCOUNTER — Inpatient Hospital Stay: Payer: Self-pay

## 2018-06-03 ENCOUNTER — Inpatient Hospital Stay (HOSPITAL_COMMUNITY): Payer: Medicaid Other

## 2018-06-03 DIAGNOSIS — J9601 Acute respiratory failure with hypoxia: Secondary | ICD-10-CM

## 2018-06-03 DIAGNOSIS — C159 Malignant neoplasm of esophagus, unspecified: Secondary | ICD-10-CM

## 2018-06-03 DIAGNOSIS — J9600 Acute respiratory failure, unspecified whether with hypoxia or hypercapnia: Secondary | ICD-10-CM

## 2018-06-03 DIAGNOSIS — J90 Pleural effusion, not elsewhere classified: Secondary | ICD-10-CM

## 2018-06-03 LAB — CBC
HCT: 39.5 % (ref 39.0–52.0)
Hemoglobin: 13.6 g/dL (ref 13.0–17.0)
MCH: 30.5 pg (ref 26.0–34.0)
MCHC: 34.4 g/dL (ref 30.0–36.0)
MCV: 88.6 fL (ref 80.0–100.0)
Platelets: 298 10*3/uL (ref 150–400)
RBC: 4.46 MIL/uL (ref 4.22–5.81)
RDW: 12.6 % (ref 11.5–15.5)
WBC: 15.4 10*3/uL — ABNORMAL HIGH (ref 4.0–10.5)
nRBC: 0 % (ref 0.0–0.2)

## 2018-06-03 LAB — GLUCOSE, CAPILLARY
Glucose-Capillary: 110 mg/dL — ABNORMAL HIGH (ref 70–99)
Glucose-Capillary: 139 mg/dL — ABNORMAL HIGH (ref 70–99)
Glucose-Capillary: 148 mg/dL — ABNORMAL HIGH (ref 70–99)
Glucose-Capillary: 151 mg/dL — ABNORMAL HIGH (ref 70–99)
Glucose-Capillary: 168 mg/dL — ABNORMAL HIGH (ref 70–99)
Glucose-Capillary: 89 mg/dL (ref 70–99)

## 2018-06-03 LAB — BASIC METABOLIC PANEL
Anion gap: 12 (ref 5–15)
CHLORIDE: 96 mmol/L — AB (ref 98–111)
CO2: 28 mmol/L (ref 22–32)
Calcium: 8.9 mg/dL (ref 8.9–10.3)
Creatinine, Ser: 0.69 mg/dL (ref 0.61–1.24)
GFR calc Af Amer: >60 mL/min (ref >60)
GFR calc non Af Amer: >60 mL/min (ref >60)
Glucose, Bld: 124 mg/dL — ABNORMAL HIGH (ref 70–99)
Potassium: 4.4 mmol/L (ref 3.5–5.1)
Sodium: 136 mmol/L (ref 135–145)

## 2018-06-03 LAB — POCT I-STAT 7, (LYTES, BLD GAS, ICA,H+H)
Acid-Base Excess: 6 mmol/L — ABNORMAL HIGH (ref 0.0–2.0)
Acid-Base Excess: 5 mmol/L — ABNORMAL HIGH (ref 0.0–2.0)
Bicarbonate: 30.9 mmol/L — ABNORMAL HIGH (ref 20.0–28.0)
Bicarbonate: 30.5 mmol/L — ABNORMAL HIGH (ref 20.0–28.0)
Calcium, Ion: 1.15 mmol/L (ref 1.15–1.40)
Calcium, Ion: 1.14 mmol/L — ABNORMAL LOW (ref 1.15–1.40)
HCT: 40 % (ref 39.0–52.0)
HCT: 38 % — ABNORMAL LOW (ref 39.0–52.0)
Hemoglobin: 13.6 g/dL (ref 13.0–17.0)
Hemoglobin: 12.9 g/dL — ABNORMAL LOW (ref 13.0–17.0)
O2 Saturation: 87 %
O2 Saturation: 99 %
Patient temperature: 98.6
Potassium: 3 mmol/L — ABNORMAL LOW (ref 3.5–5.1)
Potassium: 3 mmol/L — ABNORMAL LOW (ref 3.5–5.1)
Sodium: 133 mmol/L — ABNORMAL LOW (ref 135–145)
Sodium: 134 mmol/L — ABNORMAL LOW (ref 135–145)
TCO2: 32 mmol/L (ref 22–32)
TCO2: 32 mmol/L (ref 22–32)
pCO2 arterial: 43.8 mmHg (ref 32.0–48.0)
pCO2 arterial: 47.4 mmHg (ref 32.0–48.0)
pH, Arterial: 7.457 — ABNORMAL HIGH (ref 7.350–7.450)
pH, Arterial: 7.416 (ref 7.350–7.450)
pO2, Arterial: 51 mmHg — ABNORMAL LOW (ref 83.0–108.0)
pO2, Arterial: 147 mmHg — ABNORMAL HIGH (ref 83.0–108.0)

## 2018-06-03 LAB — ECHOCARDIOGRAM COMPLETE
Height: 67 in
Weight: 2141.11 oz

## 2018-06-03 LAB — TROPONIN I: Troponin I: <0.03 ng/mL (ref <0.03)

## 2018-06-03 LAB — POCT ACTIVATED CLOTTING TIME: Activated Clotting Time: 0 seconds

## 2018-06-03 LAB — LACTIC ACID, PLASMA
Lactic Acid, Venous: 1.1 mmol/L (ref 0.5–1.9)
Lactic Acid, Venous: 1.1 mmol/L (ref 0.5–1.9)

## 2018-06-03 MED ORDER — CHLORHEXIDINE GLUCONATE 0.12% ORAL RINSE (MEDLINE KIT): 15.0000 mL | Freq: Two times a day (BID) | OROMUCOSAL | Status: DC

## 2018-06-03 MED ORDER — MIDAZOLAM HCL 2 MG/2ML IJ SOLN
INTRAMUSCULAR | Status: AC
  Administered 2018-06-03: 2 mg via INTRAVENOUS
  Filled 2018-06-03: qty 2

## 2018-06-03 MED ORDER — MIDAZOLAM HCL 2 MG/2ML IJ SOLN
2.0000 mg | INTRAMUSCULAR | Status: AC | PRN
  Administered 2018-06-03 (×3): 2 mg via INTRAVENOUS
  Filled 2018-06-03 (×2): qty 2

## 2018-06-03 MED ORDER — FENTANYL CITRATE (PF) 100 MCG/2ML IJ SOLN: 100.0000 ug | INTRAMUSCULAR | Status: DC | PRN

## 2018-06-03 MED ORDER — CHLORHEXIDINE GLUCONATE 0.12% ORAL RINSE (MEDLINE KIT)
15.0000 mL | Freq: Two times a day (BID) | OROMUCOSAL | Status: DC
  Administered 2018-06-03 – 2018-06-04 (×2): 15 mL via OROMUCOSAL

## 2018-06-03 MED ORDER — ORAL CARE MOUTH RINSE: 15.0000 mL | OROMUCOSAL | Status: DC

## 2018-06-03 MED ORDER — FAMOTIDINE IN NACL 20-0.9 MG/50ML-% IV SOLN
20.0000 mg | Freq: Two times a day (BID) | INTRAVENOUS | Status: DC
  Administered 2018-06-03 – 2018-06-06 (×6): 20 mg via INTRAVENOUS
  Filled 2018-06-03 (×6): qty 50

## 2018-06-03 MED ORDER — SODIUM CHLORIDE 0.9% FLUSH
10.0000 mL | Freq: Two times a day (BID) | INTRAVENOUS | Status: DC
  Administered 2018-06-03 – 2018-06-09 (×13): 10 mL
  Administered 2018-06-11: 20 mL
  Administered 2018-06-12 – 2018-06-16 (×9): 10 mL
  Administered 2018-06-16: 20 mL
  Administered 2018-06-17 – 2018-06-23 (×9): 10 mL
  Administered 2018-06-23: 30 mL
  Administered 2018-06-24 – 2018-06-25 (×2): 10 mL
  Administered 2018-06-25: 30 mL
  Administered 2018-06-26 – 2018-07-03 (×14): 10 mL
  Administered 2018-07-03: 30 mL

## 2018-06-03 MED ORDER — CHLORHEXIDINE GLUCONATE CLOTH 2 % EX PADS
6.0000 | MEDICATED_PAD | Freq: Every day | CUTANEOUS | Status: DC
  Administered 2018-06-03 – 2018-06-17 (×13): 6 via TOPICAL

## 2018-06-03 MED ORDER — VANCOMYCIN HCL 10 G IV SOLR
1250.0000 mg | INTRAVENOUS | Status: DC
  Administered 2018-06-04 – 2018-06-05 (×2): 1250 mg via INTRAVENOUS
  Filled 2018-06-03 (×3): qty 1250

## 2018-06-03 MED ORDER — ORAL CARE MOUTH RINSE
15.0000 mL | OROMUCOSAL | Status: DC
  Administered 2018-06-03 – 2018-06-04 (×7): 15 mL via OROMUCOSAL

## 2018-06-03 MED ORDER — VANCOMYCIN HCL 10 G IV SOLR
1250.0000 mg | Freq: Once | INTRAVENOUS | Status: AC
  Administered 2018-06-03: 1250 mg via INTRAVENOUS
  Filled 2018-06-03: qty 1250

## 2018-06-03 MED ORDER — NICARDIPINE HCL IN NACL 20-0.86 MG/200ML-% IV SOLN
3.0000 mg/h | INTRAVENOUS | Status: DC
  Administered 2018-06-03 – 2018-06-05 (×3): 3 mg/h via INTRAVENOUS
  Administered 2018-06-06 – 2018-06-07 (×6): 5 mg/h via INTRAVENOUS
  Administered 2018-06-07: 12 mg/h via INTRAVENOUS
  Administered 2018-06-07: 7 mg/h via INTRAVENOUS
  Filled 2018-06-03 (×9): qty 200

## 2018-06-03 MED ORDER — MIDAZOLAM HCL 2 MG/2ML IJ SOLN
2.0000 mg | INTRAMUSCULAR | Status: DC | PRN
  Administered 2018-06-04: 2 mg via INTRAVENOUS
  Filled 2018-06-03: qty 2

## 2018-06-03 MED ORDER — SODIUM CHLORIDE 0.9% FLUSH
10.0000 mL | INTRAVENOUS | Status: DC | PRN
  Administered 2018-06-18: 10 mL
  Filled 2018-06-03: qty 40

## 2018-06-03 MED ORDER — FENTANYL CITRATE (PF) 100 MCG/2ML IJ SOLN
50.0000 ug | Freq: Once | INTRAMUSCULAR | Status: AC
  Administered 2018-06-03: 50 ug via INTRAVENOUS

## 2018-06-03 MED ORDER — POTASSIUM CHLORIDE 10 MEQ/50ML IV SOLN
10.0000 meq | INTRAVENOUS | Status: AC | PRN
  Administered 2018-06-03 (×3): 10 meq via INTRAVENOUS
  Filled 2018-06-03 (×3): qty 50

## 2018-06-03 MED ORDER — PIPERACILLIN-TAZOBACTAM 3.375 G IVPB 30 MIN
3.3750 g | Freq: Once | INTRAVENOUS | Status: AC
  Administered 2018-06-03: 3.375 g via INTRAVENOUS
  Filled 2018-06-03 (×2): qty 50

## 2018-06-03 MED ORDER — FENTANYL CITRATE (PF) 100 MCG/2ML IJ SOLN
100.0000 ug | INTRAMUSCULAR | Status: DC | PRN
  Administered 2018-06-04 (×2): 100 ug via INTRAVENOUS
  Filled 2018-06-03 (×2): qty 2

## 2018-06-03 MED ORDER — PIPERACILLIN-TAZOBACTAM 3.375 G IVPB
3.3750 g | Freq: Three times a day (TID) | INTRAVENOUS | Status: AC
  Administered 2018-06-03 – 2018-06-10 (×23): 3.375 g via INTRAVENOUS
  Filled 2018-06-03 (×20): qty 50

## 2018-06-03 MED ORDER — DEXMEDETOMIDINE HCL IN NACL 200 MCG/50ML IV SOLN
0.0000 ug/kg/h | INTRAVENOUS | Status: DC
  Administered 2018-06-03: 0.4 ug/kg/h via INTRAVENOUS
  Administered 2018-06-03: 0.6 ug/kg/h via INTRAVENOUS
  Administered 2018-06-04: 0.4 ug/kg/h via INTRAVENOUS
  Administered 2018-06-04 (×2): 1.2 ug/kg/h via INTRAVENOUS
  Administered 2018-06-04: 0.8 ug/kg/h via INTRAVENOUS
  Administered 2018-06-04: 1 ug/kg/h via INTRAVENOUS
  Administered 2018-06-04: 0.9 ug/kg/h via INTRAVENOUS
  Administered 2018-06-05: 1.2 ug/kg/h via INTRAVENOUS
  Administered 2018-06-05: 1 ug/kg/h via INTRAVENOUS
  Filled 2018-06-03 (×3): qty 50
  Filled 2018-06-03: qty 100
  Filled 2018-06-03 (×6): qty 50

## 2018-06-03 MED ORDER — NOREPINEPHRINE 4 MG/250ML-% IV SOLN
INTRAVENOUS | Status: AC
  Administered 2018-06-03: 4 mg
  Filled 2018-06-03: qty 250

## 2018-06-03 MED ORDER — ROCURONIUM BROMIDE 50 MG/5ML IV SOLN
50.0000 mg | Freq: Once | INTRAVENOUS | Status: AC
  Administered 2018-06-03: 50 mg via INTRAVENOUS
  Filled 2018-06-03: qty 5

## 2018-06-03 MED ORDER — MIDAZOLAM HCL 2 MG/2ML IJ SOLN
2.0000 mg | Freq: Once | INTRAMUSCULAR | Status: AC
  Administered 2018-06-03: 2 mg via INTRAVENOUS

## 2018-06-03 MED ORDER — ETOMIDATE 2 MG/ML IV SOLN
20.0000 mg | Freq: Once | INTRAVENOUS | Status: AC
  Administered 2018-06-03: 20 mg via INTRAVENOUS

## 2018-06-03 MED ORDER — FUROSEMIDE 10 MG/ML IJ SOLN
40.0000 mg | Freq: Once | INTRAMUSCULAR | Status: AC
  Administered 2018-06-03: 40 mg via INTRAVENOUS

## 2018-06-03 MED ORDER — FENTANYL CITRATE (PF) 100 MCG/2ML IJ SOLN
INTRAMUSCULAR | Status: AC
  Administered 2018-06-03: 50 ug via INTRAVENOUS
  Filled 2018-06-03: qty 2

## 2018-06-03 MED ORDER — SODIUM CHLORIDE 0.9 % IV SOLN
INTRAVENOUS | Status: DC | PRN
  Administered 2018-06-03 – 2018-06-08 (×2): via INTRAVENOUS

## 2018-06-03 NOTE — Progress Notes (Signed)
Patient ID: Aaron Wang, male   DOB: [birth year], 63 y.o.   MRN: [id number] TCTS Evening Rounds:  Seen by CCM this afternoon for worsening hypoxemic respiratory failure with CXR showing bilateral diffuse air space disease. He did not improve with diuresis over the past 24 hrs. He was intubated this afternoon and bronchoscopy showed bilious fluid in airways bilaterally consistent with aspiration. He has not had any vomiting and has NG tube in after transhiatal esophagectomy with cervical anastomosis.   Hemodynamics stable since intubation and currently Nicardipine is off. Sats 99% on vent.  Good urine output   Echo today showed normal LV systolic function with diastolic dysfunction. No significant valvular abnormality. Some left pleural effusion.   BMET    Component Value Date/Time   NA 134 (L) 06/03/2018 1744   K 3.0 (L) 06/03/2018 1744   CL 96 (L) 06/03/2018 0253   CO2 28 06/03/2018 0253   GLUCOSE 124 (H) 06/03/2018 0253   BUN <5 (L) 06/03/2018 0253   CREATININE 0.69 06/03/2018 0253   CALCIUM 8.9 06/03/2018 0253   GFRNONAA >60 06/03/2018 0253   GFRAA >60 06/03/2018 0253   Replacing K+  CBC    Component Value Date/Time   WBC 15.4 (H) 06/03/2018 0534   RBC 4.46 06/03/2018 0534   HGB 12.9 (L) 06/03/2018 1744   HCT 38.0 (L) 06/03/2018 1744   PLT 298 06/03/2018 0534   MCV 88.6 06/03/2018 0534   MCH 30.5 06/03/2018 0534   MCHC 34.4 06/03/2018 0534   RDW 12.6 06/03/2018 0534   LYMPHSABS 1.1 01/04/2018 1503   MONOABS 0.4 01/04/2018 1503   EOSABS 0.1 01/04/2018 1503   BASOSABS 0.0 01/04/2018 1503    Continue ventilator support, IV vanc and Zosyn, follow up on cultures. Continue J-tube feeding.

## 2018-06-03 NOTE — Consult Note (Addendum)
2  NAME:  Aaron Wang, MRN:  [id number], DOB:  02/04/1956, LOS: 5 ADMISSION DATE:  05/29/2018, CONSULTATION DATE:  06/01/2018 REFERRING MD:  Cyndia Bent, CHIEF COMPLAINT:  Dyspnea   Brief History   63 year old male status post esophagectomy on May 29, 2018 developed worsening acute respiratory failure with hypoxemia requiring intubation on June 03, 2018.  History of present illness   This is a pleasant 63 year old male who smokes cigarettes who presented [facility] with the finding of squamous cell carcinoma of the esophagus.  On May 29, 2018 he underwent an elective bronchoscopy, transhiatal total esophagectomy with cervical esophagogastrostomy, pyloromyotomy, feeding jejunostomy tube, placement of left chest tube.  Postoperatively he initially did well but over the course of 48 hours from February 20 through February 22nd he had hypertension, increasing shortness of breath.  He says that he has some pain in his abdomen when he coughs.  His cough is dry.  He has been coughing up some mucus, he does not recall a severe nausea or vomiting episode in the last few days.  Past Medical History  Gastroesophageal reflux disease, hypertension, squamous cell carcinoma of the esophagus, carpal tunnel syndrome  Significant Hospital Events   February 17 admission, esophagogastrogastrostomy  Consults:  Pulmonary and critical care medicine on June 03, 2018  Procedures:  February 17th esophagogastrogastrastostomy February 22 PICC line February 22 endotracheal tube Feb 22 bronchoscopy: gastric secretions in trachea  Significant Diagnostic Tests:  June 03, 2018 echocardiogram LVEF 60 to 65%, mildly increased left ventricular wall thickness, impaired relaxation, RVSP 36, normal RV systolic function, pericardial effusion posterior to the left ventricle, mild to moderate tricuspid regurgitation, mild calcification of aortic valve, moderate left effusion  Micro Data:   February 22 blood culture February 22 respiratory culture  Antimicrobials:  February 22 Zosyn February 22 vancomycin  Interim history/subjective:  As above  Objective   Blood pressure (!) 152/82, pulse (!) 116, temperature 97.8 F (36.6 C), temperature source Axillary, resp. rate (!) 25, height _0  (1.702 m), weight 60.7 kg, SpO2 (!) 89 %.        Intake/Output Summary (Last 24 hours) at 06/03/2018 1535 Last data filed at 06/03/2018 1416 Gross per 24 hour  Intake 2292.51 ml  Output 5150 ml  Net -2857.49 ml   Filed Weights   05/29/18 0604 05/31/18 0500 06/01/18 0600  Weight: 58.5 kg 60.1 kg 60.7 kg    Examination:  General:  In bed, increased work of breathing, speaking in full sentences, using accessory muscles HENT: Surgical scar left neck well dressed, dry OP clear PULM: Coarse crackles bilaterally, increased effort CV: Tachycardic, regular rate and rhythm GI: no bowel sounds, midline dressing in place MSK: normal bulk and tone Neuro: awake, alert, no distress, B and E Hospital Problem list     Assessment & Plan:  Acute respiratory failure with hypoxemia: Chest x-ray shows diffuse bilateral infiltrates, I worry this represents aspiration pneumonitis versus healthcare associated pneumonia.  He has been adequately diuresed and has not improved. Bronchoscopy suggestive of aspiration -Intubate now -Full mechanical ventilatory support -Ventilator associated pneumonia prevention protocol -Daily wake-up assessment/SBT -Bronchoscopy after intubation, BAL -Continue vancomycin/Zosyn  Hypertension: Echo OK -Continue nicardipine drip titrated to SBP < 160  HCAP: -collect BAL -continue vanc/zosyn  Left pleural effusion -will likely need repeat Chest tube vs thora, will defer to TCTS  Esophageal cancer - per TCTS  Best practice:  Diet: NPO Pain/Anxiety/Delirium protocol (if indicated): yes, PAD protocol, precedex, prn fentanyl, prn versed  VAP protocol  (if indicated):  DVT prophylaxis: SCD GI prophylaxis: Famotidine Glucose control: SSI Mobility: Bed rest Code Status: full Family Communication: updated daughter by phone at time of intubation Disposition: Full  Labs   CBC: Recent Labs  Lab 05/30/18 0404 05/31/18 0440 06/01/18 0358 06/02/18 0317 06/03/18 0534  WBC 8.5 9.7 8.7 6.3 15.4*  HGB 11.3* 11.7* 11.0* 10.8* 13.6  HCT 34.8* 35.1* 33.7* 32.9* 39.5  MCV 93.3 93.4 92.8 91.1 88.6  PLT 222 205 208 223 416    Basic Metabolic Panel: Recent Labs  Lab 05/30/18 0404 05/31/18 0440 06/01/18 0358 06/02/18 0317 06/03/18 0253  NA 136 136 134* 135 136  K 4.0 3.5 3.2* 3.3* 4.4  CL 107 105 103 103 96*  CO2 _0 GLUCOSE 122* 129* 105* 85 124*  BUN 9 <5* <5* <5* <5*  CREATININE 0.89 0.70 0.64 0.66 0.69  CALCIUM 8.1* 8.1* 8.1* 8.2* 8.9  MG  --  1.8  --   --   --    GFR: Estimated Creatinine Clearance: 84.3 mL/min (by C-G formula based on SCr of 0.69 mg/dL). Recent Labs  Lab 05/31/18 0440 06/01/18 0358 06/02/18 0317 06/03/18 0534 06/03/18 1155 06/03/18 1424  WBC 9.7 8.7 6.3 15.4*  --   --   LATICACIDVEN  --   --   --   --  1.1 1.1    Liver Function Tests: No results for input(s): AST, ALT, ALKPHOS, BILITOT, PROT, ALBUMIN in the last 168 hours. No results for input(s): LIPASE, AMYLASE in the last 168 hours. No results for input(s): AMMONIA in the last 168 hours.  ABG    Component Value Date/Time   PHART 7.311 (L) 05/30/2018 0400   PCO2ART 47.5 05/30/2018 0400   PO2ART 185.0 (H) 05/30/2018 0400   HCO3 24.1 05/30/2018 0400   TCO2 26 05/30/2018 0400   ACIDBASEDEF 3.0 (H) 05/30/2018 0400   O2SAT 100.0 05/30/2018 0400     Coagulation Profile: No results for input(s): INR, PROTIME in the last 168 hours.  Cardiac Enzymes: Recent Labs  Lab 06/03/18 1155  TROPONINI <0.03    HbA1C: No results found for: HGBA1C  CBG: Recent Labs  Lab 06/02/18 2345 06/03/18 0333 06/03/18 0721 06/03/18 1117  06/03/18 1533  GLUCAP 94 151* 148* 110* 168*    Review of Systems:   Cannot obtain due to respiratory failure  Past Medical History  He,  has a past medical history of Back pain, Carpal tunnel syndrome, Esophageal cancer ([place]), GERD (gastroesophageal reflux disease), Hypertension, and Injury.   Surgical History    Past Surgical History:  Procedure Laterality Date  . BACK SURGERY  1983   L 4 L5 disectomy  . CHEST TUBE INSERTION Left 05/29/2018   Procedure: LEFT CHEST TUBE INSERTION;  Surgeon: [name], MD;  Location: [place];  Service: Thoracic;  Laterality: Left;  . COMPLETE ESOPHAGECTOMY N/A 05/29/2018   Procedure: TRANSHIATAL TOTAL ESOPHAGECTOMY;  Surgeon: [name], MD;  Location: [facility] OR;  Service: Thoracic;  Laterality: N/A;  . FLEXIBLE BRONCHOSCOPY  05/29/2018   Procedure: Flexible Bronchoscopy;  Surgeon: [name], MD;  Location: [place];  Service: Thoracic;;  . JEJUNOSTOMY N/A 05/29/2018   Procedure: FEEDING JEJUNOSTOMY;  Surgeon: [name], MD;  Location: [place];  Service: Thoracic;  Laterality: N/A;  . PYLOROPLASTY N/A 05/29/2018   Procedure: Edwena Blow;  Surgeon: [name], MD;  Location: [place];  Service: Thoracic;  Laterality: N/A;  . THORACOTOMY  Right 05/29/2018   Procedure: CERVICOESOPHAGO GASTROSTOMY;  Surgeon: [name], MD;  Location: [place];  Service: Thoracic;  Laterality: Right;  . WRIST SURGERY Bilateral    carpal tunnel     Social History   reports that he has been smoking. He has a 1.75 pack-year smoking history. He has never used smokeless tobacco. He reports current alcohol use of about 4.0 standard drinks of alcohol per week. He reports that he does not use drugs.   Family History   His family history includes Heart attack in his mother. There is no history of Colon cancer.   Allergies No Known Allergies   Home Medications  Prior to Admission medications   Medication Sig Start Date End Date Taking?  Authorizing Provider  ibuprofen (ADVIL,MOTRIN) 800 MG tablet Take 1 tablet (800 mg total) by mouth 3 (three) times daily. Patient taking differently: Take 800 mg by mouth every 8 (eight) hours as needed for mild pain.  03/27/15  Yes Montine Circle, PA-C  lisinopril-hydrochlorothiazide (PRINZIDE) 20-12.5 MG per tablet Take 1 tablet by mouth daily. 04/13/12  Yes Domenic Moras, PA-C  traMADol (ULTRAM) 50 MG tablet Take 1 tablet (50 mg total) by mouth every 12 (twelve) hours as needed. Patient taking differently: Take 50 mg by mouth every 12 (twelve) hours as needed (pain).  04/26/18  Yes Owens Shark, NP     Critical care time: 45 minutes    Roselie Awkward, MD [place] Pager: [phone] Cell: [phone] If no response, call [phone]

## 2018-06-03 NOTE — Progress Notes (Signed)
13mg  IV PCA dilaudid wasted in sink, witnessed by Laddie Aquas, RN

## 2018-06-03 NOTE — Progress Notes (Signed)
Patient became agitated during bath with several attempts to pull ET tube and get out of bed. Patient not able to be redirected. PRN Versed given and Deterding, MD gave order for bilateral soft wrist restraints.

## 2018-06-03 NOTE — Progress Notes (Addendum)
5 Days Post-Op Procedure(s) (LRB): TRANSHIATAL TOTAL ESOPHAGECTOMY (N/A) PYLOROMYOTOMY (N/A) FEEDING JEJUNOSTOMY (N/A) CERVICOESOPHAGO GASTROSTOMY (Right) Flexible Bronchoscopy LEFT CHEST TUBE INSERTION (Left) Subjective: Acute respiratory failure with bilat edema, new L pleural effusion,coarse rales  associated with HTN, increased WBC  Treated with lasix, antihypertensives, antibiotics Now on hi flow 15 L sat 88-90% No emesis or aspiration Bicarb normal Incisions clean, abd exam benign No c/o pain Had loose BM yesterday Objective: Vital signs in last 24 hours: Temp:  [97.4 F (36.3 C)-98.1 F (36.7 C)] 97.8 F (36.6 C) (02/22 1119) Pulse Rate:  [77-131] 98 (02/22 1100) Cardiac Rhythm: Normal sinus rhythm (02/22 1000) Resp:  [11-34] 31 (02/22 1100) BP: (148-208)/(77-128) 158/99 (02/22 1100) SpO2:  [87 %-100 %] 87 % (02/22 1100)  Hemodynamic parameters for last 24 hours:    Intake/Output from previous day: 02/21 0701 - 02/22 0700 In: 2061.8 [I.V.:1561.8; NG/GT:500] Out: 4425 [Urine:4425] Intake/Output this shift: Total I/O In: 572.3 [I.V.:187.9; NG/GT:90; IV Piggyback:294.4] Out: 200 [Urine:200]  EXAM Anxious bilat coarse rales No murmur appreciated abd benign  Lab Results: Recent Labs    06/02/18 0317 06/03/18 0534  WBC 6.3 15.4*  HGB 10.8* 13.6  HCT 32.9* 39.5  PLT 223 298   BMET:  Recent Labs    06/02/18 0317 06/03/18 0253  NA 135 136  K 3.3* 4.4  CL 103 96*  CO2 25 28  GLUCOSE 85 124*  BUN <5* <5*  CREATININE 0.66 0.69  CALCIUM 8.2* 8.9    PT/INR: No results for input(s): LABPROT, INR in the last 72 hours. ABG    Component Value Date/Time   PHART 7.311 (L) 05/30/2018 0400   HCO3 24.1 05/30/2018 0400   TCO2 26 05/30/2018 0400   ACIDBASEDEF 3.0 (H) 05/30/2018 0400   O2SAT 100.0 05/30/2018 0400   CBG (last 3)  Recent Labs    06/03/18 0333 06/03/18 0721 06/03/18 1117  GLUCAP 151* 148* 110*    Assessment/Plan: S/P Procedure(s)  (LRB): TRANSHIATAL TOTAL ESOPHAGECTOMY (N/A) PYLOROMYOTOMY (N/A) FEEDING JEJUNOSTOMY (N/A) CERVICOESOPHAGO GASTROSTOMY (Right) Flexible Bronchoscopy LEFT CHEST TUBE INSERTION (Left) BP control and diuresis eval cardiac with enzymes, echo Blood cultures, lactic acid, KUB  LOS: 5 days    Tharon Aquas Trigt III 06/03/2018

## 2018-06-03 NOTE — Procedures (Signed)
PCCM Video Bronchoscopy Procedure Note  The patient was informed of the risks (including but not limited to bleeding, infection, respiratory failure, lung injury, tooth/oral injury) and benefits of the procedure and gave consent, see chart.  Indication: Acute respiratory failure with hypoxemia  Post Procedure Diagnosis: Aspiration of gastric contents  Location: [facility] ICU  Condition pre procedure: Critically ill, on vent  Medications for procedure: etomidate, versed, rocuronium, fentanyl  Procedure description: The bronchoscope was introduced through the endotracheal and passed to the bilateral lungs to the level of the subsegmental bronchi throughout the tracheobronchial tree. There was green fluid in the trachea running into each mainstem bronchus bilaterally.  No airway lesions, scope removed.  Procedures performed: BAL Lingula  Specimens sent: BAL   Condition post procedure: critically ill, on vent  EBL: none immediate  Complications: none immediate  Roselie Awkward, MD [place] PCCM Pager: [phone] Cell: [phone] If no response, call [phone]

## 2018-06-03 NOTE — Progress Notes (Signed)
Patient complaining of shortness of breath. O2 saturation is 87% with 15 Liters high flow nasal cannula. Respiratory therapist contacted. Nurse at patient bedside encouraging patient to deep breath. Patient answers telephone and begins talking. O2 saturation began decreasing to lower 80s. Nurse educated patient on importance of focusing on breathing. Patient did get off the telephone after two to three minutes. O2 saturations increased. Respiratory therapist came to bedside. Dr. Cyndia Bent paged about status change. Orders received.

## 2018-06-03 NOTE — Procedures (Signed)
Intubation Procedure Note Aaron Wang [id number] March 03, 1956  Procedure: Intubation Indications: Respiratory insufficiency  Procedure Details Consent: Risks of procedure as well as the alternatives and risks of each were explained to the (patient/caregiver).  Consent for procedure obtained. Time Out: Verified patient identification, verified procedure, site/side was marked, verified correct patient position, special equipment/implants available, medications/allergies/relevent history reviewed, required imaging and test results available.  Performed  Drugs Etomidate 20mg  IV, Versed 2mg  IV, Fentanyl 15mcg IV, Rocuronium 50mg  IV DL x 1 with GS 3 blade Grade 1 view 8.0 ET tube passed through cords under direct visualization Placement confirmed with bilateral breath sounds, positive EtCO2 change and smoke in tube   Evaluation Hemodynamic Status: BP stable throughout; O2 sats: stable throughout Patient's Current Condition: stable Complications: No apparent complications Patient did tolerate procedure well. Chest X-ray ordered to verify placement.  CXR: pending.   Simonne Maffucci 06/03/2018

## 2018-06-03 NOTE — Progress Notes (Signed)
CRITICAL VALUE ALERT  Critical Value:  Heart rate increased to 190. Sustained for approximately 5 seconds. Heart rate returned to 120.  Provider Notified: Dr. Cyndia Bent, 06/03/18 0645  Orders Received/Actions taken: Orders received. See MAR.

## 2018-06-03 NOTE — Progress Notes (Signed)
Peripherally Inserted Central Catheter/Midline Placement  The IV Nurse has discussed with the patient and/or persons authorized to consent for the patient, the purpose of this procedure and the potential benefits and risks involved with this procedure.  The benefits include less needle sticks, lab draws from the catheter, and the patient may be discharged home with the catheter. Risks include, but not limited to, infection, bleeding, blood clot (thrombus formation), and puncture of an artery; nerve damage and irregular heartbeat and possibility to perform a PICC exchange if needed/ordered by physician.  Alternatives to this procedure were also discussed.  Bard Power PICC patient education guide, fact sheet on infection prevention and patient information card has been provided to patient /or left at bedside.    PICC/Midline Placement Documentation  PICC Triple Lumen 06/03/18 PICC Right Brachial 39 cm 0 cm (Active)  Indication for Insertion or Continuance of Line Limited venous access - need for IV therapy >5 days (PICC only);Prolonged intravenous therapies 06/03/2018  1:00 PM  Exposed Catheter (cm) 0 cm 06/03/2018  1:00 PM  Site Assessment Clean;Dry;Intact 06/03/2018  1:00 PM  Lumen #1 Status Flushed;Blood return noted;Saline locked 06/03/2018  1:00 PM  Lumen #2 Status Flushed;Blood return noted;Saline locked 06/03/2018  1:00 PM  Lumen #3 Status Flushed;Blood return noted;Saline locked 06/03/2018  1:00 PM  Dressing Type Transparent 06/03/2018  1:00 PM  Dressing Status Clean;Dry;Intact;Antimicrobial disc in place 06/03/2018  1:00 PM  Line Care Connections checked and tightened 06/03/2018  1:00 PM  Dressing Intervention New dressing 06/03/2018  1:00 PM  Dressing Change Due 06/10/18 06/03/2018  1:00 PM       Lorenza Cambridge 06/03/2018, 1:35 PM

## 2018-06-03 NOTE — Progress Notes (Addendum)
Verbal order from [place], MD to advance NG tube about 2 inches. This RN advanced tube. NG tube was measured at 70cm at beginning of shift. Deterding, MD noted NG needed to be advanced and was possibly contributed to possible aspiration.

## 2018-06-03 NOTE — Progress Notes (Signed)
Pharmacy Antibiotic Note  Aaron Wang is a 63 y.o. male admitted on 05/29/2018 with pneumonia.  Pharmacy has been consulted for vancomycin and zosyn dosing.  Plan: Zosyn 3.375g IV q8h (4 hour infusion).  Vancomycin 1250 mg IV q24 F/u renal function, cultures and clinical course  Height: 5\' 7"  (170.2 cm) Weight: 133 lb 13.1 oz (60.7 kg) IBW/kg (Calculated) : 66.1  Temp (24hrs), Avg:97.8 F (36.6 C), Min:97.4 F (36.3 C), Max:98.3 F (36.8 C)  Recent Labs  Lab 05/30/18 0404 05/31/18 0440 06/01/18 0358 06/02/18 0317 06/03/18 0253 06/03/18 0534  WBC 8.5 9.7 8.7 6.3  --  15.4*  CREATININE 0.89 0.70 0.64 0.66 0.69  --     Estimated Creatinine Clearance: 84.3 mL/min (by C-G formula based on SCr of 0.69 mg/dL).    No Known Allergies  Thank you for allowing pharmacy to be a part of this patient's care.  Excell Seltzer Poteet 06/03/2018 7:00 AM

## 2018-06-03 NOTE — Procedures (Signed)
Bedside Bronchoscopy Procedure Note Aaron Wang [id number] 1955-10-18  Procedure: Bronchoscopy Indications: Obtain specimens for culture and/or other diagnostic studies  Procedure Details: ET Tube Size: ET Tube secured at lip (cm): Bite block in place: Yes In preparation for procedure, Patient hyper-oxygenated with 100 % FiO2 Airway entered and the following bronchi were examined: RUL.   Bronchoscope removed.    Evaluation BP (!) 152/82   Pulse (!) 116   Temp (!) 96.9 F (36.1 C) (Axillary)   Resp (!) 25   Ht _0  (1.702 m)   Wt 60.7 kg   SpO2 (!) 89%   BMI 20.96 kg/m  Breath Sounds:Clear O2 sats: stable throughout Patient's Current Condition: stable Specimens: BAL Complications: No apparent complications Patient did tolerate procedure well.   [name] 06/03/2018, 4:22 PM

## 2018-06-03 NOTE — Progress Notes (Signed)
  Echocardiogram 2D Echocardiogram has been performed.  Aaron Wang 06/03/2018, 12:41 PM

## 2018-06-04 ENCOUNTER — Inpatient Hospital Stay (HOSPITAL_COMMUNITY): Payer: Medicaid Other

## 2018-06-04 DIAGNOSIS — J690 Pneumonitis due to inhalation of food and vomit: Secondary | ICD-10-CM

## 2018-06-04 DIAGNOSIS — G9340 Encephalopathy, unspecified: Secondary | ICD-10-CM

## 2018-06-04 DIAGNOSIS — I469 Cardiac arrest, cause unspecified: Secondary | ICD-10-CM

## 2018-06-04 LAB — CBC
HCT: 28.7 % — ABNORMAL LOW (ref 39.0–52.0)
Hemoglobin: 9.7 g/dL — ABNORMAL LOW (ref 13.0–17.0)
MCH: 30.8 pg (ref 26.0–34.0)
MCHC: 33.8 g/dL (ref 30.0–36.0)
MCV: 91.1 fL (ref 80.0–100.0)
Platelets: 186 10*3/uL (ref 150–400)
RBC: 3.15 MIL/uL — ABNORMAL LOW (ref 4.22–5.81)
RDW: 13.2 % (ref 11.5–15.5)
WBC: 6.8 10*3/uL (ref 4.0–10.5)
nRBC: 0 % (ref 0.0–0.2)

## 2018-06-04 LAB — BASIC METABOLIC PANEL
Anion gap: 9 (ref 5–15)
BUN: 7 mg/dL (ref 6–20)
CO2: 28 mmol/L (ref 22–32)
CREATININE: 0.77 mg/dL (ref 0.61–1.24)
Calcium: 7.6 mg/dL — ABNORMAL LOW (ref 8.9–10.3)
Chloride: 98 mmol/L (ref 98–111)
GFR calc Af Amer: >60 mL/min (ref >60)
GFR calc non Af Amer: >60 mL/min (ref >60)
GLUCOSE: 237 mg/dL — AB (ref 70–99)
Potassium: 3.3 mmol/L — ABNORMAL LOW (ref 3.5–5.1)
Sodium: 135 mmol/L (ref 135–145)

## 2018-06-04 LAB — GLUCOSE, CAPILLARY
Glucose-Capillary: 128 mg/dL — ABNORMAL HIGH (ref 70–99)
Glucose-Capillary: 132 mg/dL — ABNORMAL HIGH (ref 70–99)
Glucose-Capillary: 129 mg/dL — ABNORMAL HIGH (ref 70–99)
Glucose-Capillary: 106 mg/dL — ABNORMAL HIGH (ref 70–99)
Glucose-Capillary: 130 mg/dL — ABNORMAL HIGH (ref 70–99)
Glucose-Capillary: 143 mg/dL — ABNORMAL HIGH (ref 70–99)

## 2018-06-04 MED ORDER — CHLORHEXIDINE GLUCONATE 0.12% ORAL RINSE (MEDLINE KIT)
15.0000 mL | Freq: Two times a day (BID) | OROMUCOSAL | Status: DC
  Administered 2018-06-04 – 2018-06-15 (×16): 15 mL via OROMUCOSAL

## 2018-06-04 MED ORDER — ATROPINE SULFATE 1 MG/10ML IJ SOSY
PREFILLED_SYRINGE | INTRAMUSCULAR | Status: AC
  Administered 2018-06-04: 1 mg
  Filled 2018-06-04: qty 10

## 2018-06-04 MED ORDER — MIDAZOLAM HCL 2 MG/2ML IJ SOLN
2.0000 mg | INTRAMUSCULAR | Status: DC | PRN
  Filled 2018-06-04: qty 2

## 2018-06-04 MED ORDER — ACETAMINOPHEN 160 MG/5ML PO SOLN
325.0000 mg | ORAL | Status: DC | PRN
  Administered 2018-06-13 – 2018-07-04 (×33): 325 mg
  Filled 2018-06-04 (×35): qty 20.3

## 2018-06-04 MED ORDER — ATROPINE SULFATE 1 MG/10ML IJ SOSY
PREFILLED_SYRINGE | INTRAMUSCULAR | Status: AC
  Filled 2018-06-04: qty 10

## 2018-06-04 MED ORDER — MIDAZOLAM HCL 2 MG/2ML IJ SOLN: 2.0000 mg | INTRAMUSCULAR | Status: DC | PRN

## 2018-06-04 MED ORDER — FENTANYL CITRATE (PF) 100 MCG/2ML IJ SOLN: 100.0000 ug | INTRAMUSCULAR | Status: DC | PRN

## 2018-06-04 MED ORDER — CHLORHEXIDINE GLUCONATE 0.12 % MT SOLN: 15.0000 mL | Freq: Two times a day (BID) | OROMUCOSAL | Status: DC

## 2018-06-04 MED ORDER — OXYCODONE HCL 5 MG/5ML PO SOLN
5.0000 mg | ORAL | Status: DC | PRN
  Administered 2018-06-06 – 2018-07-04 (×54): 5 mg
  Filled 2018-06-04 (×56): qty 5

## 2018-06-04 MED ORDER — FAMOTIDINE IN NACL 20-0.9 MG/50ML-% IV SOLN: 20.0000 mg | Freq: Two times a day (BID) | INTRAVENOUS | Status: DC

## 2018-06-04 MED ORDER — FENTANYL CITRATE (PF) 100 MCG/2ML IJ SOLN
100.0000 ug | INTRAMUSCULAR | Status: AC | PRN
  Administered 2018-06-05 – 2018-06-06 (×3): 100 ug via INTRAVENOUS

## 2018-06-04 MED ORDER — POTASSIUM CHLORIDE 20 MEQ/15ML (10%) PO SOLN
40.0000 meq | Freq: Once | ORAL | Status: AC
  Administered 2018-06-04: 40 meq
  Filled 2018-06-04: qty 30

## 2018-06-04 MED ORDER — FENTANYL 2500MCG IN NS 250ML (10MCG/ML) PREMIX INFUSION
0.0000 ug/h | INTRAVENOUS | Status: DC
  Administered 2018-06-04: 25 ug/h via INTRAVENOUS
  Administered 2018-06-05 – 2018-06-06 (×2): 100 ug/h via INTRAVENOUS
  Filled 2018-06-04 (×2): qty 250

## 2018-06-04 MED ORDER — MIDAZOLAM HCL 2 MG/2ML IJ SOLN
INTRAMUSCULAR | Status: AC
  Administered 2018-06-04: 17:00:00
  Filled 2018-06-04: qty 2

## 2018-06-04 MED ORDER — ORAL CARE MOUTH RINSE
15.0000 mL | OROMUCOSAL | Status: DC
  Administered 2018-06-04 – 2018-06-06 (×18): 15 mL via OROMUCOSAL

## 2018-06-04 MED ORDER — DEXMEDETOMIDINE HCL IN NACL 200 MCG/50ML IV SOLN: 0.0000 ug/kg/h | INTRAVENOUS | Status: DC

## 2018-06-04 MED ORDER — ORAL CARE MOUTH RINSE
15.0000 mL | Freq: Two times a day (BID) | OROMUCOSAL | Status: DC
  Administered 2018-06-04 (×2): 15 mL via OROMUCOSAL

## 2018-06-04 MED ORDER — FENTANYL CITRATE (PF) 100 MCG/2ML IJ SOLN
INTRAMUSCULAR | Status: AC
  Administered 2018-06-04: 100 ug
  Filled 2018-06-04: qty 2

## 2018-06-04 NOTE — Procedures (Signed)
Extubation Procedure Note  Patient Details:   Name: Aaron Wang DOB: 03-29-1956 MRN: [id number]   Airway Documentation:    Vent end date: 06/04/18 Vent end time: 1045   Evaluation  O2 sats: stable throughout Complications: No apparent complications Patient did tolerate procedure well. Bilateral Breath Sounds: Clear, Diminished   Yes pt able to vocalize.  Pt extubated at this time per MD order. Pt had been placed on CPAP/PS by MD. Pt was able to breathe around deflated cuff. No stridor noted. Pt placed on 4L [place]. No stridor noted. IS performed with 573mLx10.   Irineo Axon [facility] 06/04/2018, 10:51 AM

## 2018-06-04 NOTE — Progress Notes (Addendum)
1345 Pt HR in 30s and 40s during and after liquid BM and peri clean up. Pt SPO2 dropped and sustained SPO2 in the 70s also. Pts head kept >30% during brief roll to clean up.  Pt symptomatic pale and diaphoretic and altered mental status (anxiety panic and confusion). Pt slow to recover and get HR back to 70s and SPO2 to 90s (approx 2-3 minutes). Pt remains very fast to desaturate and slow to recover. Will continue to assess and monitor  1421: Second BM: Stood patient up (turn and pivot) to [facility]. No HR changes and only brief  desat to 85-88% will continue to monitor.

## 2018-06-04 NOTE — Progress Notes (Signed)
Patient attempted to pull ET tube again and push side rails with feet with restraints applied . Elzie Rings, RN cameraed in from [place]. 2mg  of Versed given. Elzie Rings, RN also made aware of patient urine output 20-30cc an hour. PRN orders used. Will continue to monitor patient.

## 2018-06-04 NOTE — Progress Notes (Signed)
6 Days Post-Op Procedure(s) (LRB): TRANSHIATAL TOTAL ESOPHAGECTOMY (N/A) PYLOROMYOTOMY (N/A) FEEDING JEJUNOSTOMY (N/A) CERVICOESOPHAGO GASTROSTOMY (Right) Flexible Bronchoscopy LEFT CHEST TUBE INSERTION (Left) Subjective: Intubated but alert.  Stable overnight.  Objective: Vital signs in last 24 hours: Temp:  [96.9 F (36.1 C)-97.8 F (36.6 C)] 97.7 F (36.5 C) (02/23 0804) Pulse Rate:  [59-150] 69 (02/23 1000) Cardiac Rhythm: Sinus tachycardia (02/23 0820) Resp:  [12-31] 17 (02/23 1000) BP: (60-185)/(48-107) 123/73 (02/23 1000) SpO2:  [84 %-100 %] 100 % (02/23 1000) FiO2 (%):  [40 %-100 %] 40 % (02/23 0800)  Hemodynamic parameters for last 24 hours:    Intake/Output from previous day: 02/22 0701 - 02/23 0700 In: 3245 [I.V.:1429.1; NG/GT:720; IV Piggyback:765.9] Out: 2445 [Urine:2295; Emesis/NG output:150] Intake/Output this shift: Total I/O In: 204.3 [I.V.:76.2; NG/GT:30; IV Piggyback:98.1] Out: -   General appearance: alert and cooperative Neurologic: intact Heart: regular rate and rhythm, S1, S2 normal, no murmur, click, rub or gallop Lungs: clear to auscultation bilaterally Abdomen: soft, non-tender; bowel sounds normal; no masses,  no organomegaly Extremities: extremities normal, atraumatic, no cyanosis or edema Wound: neck and abdominal incisions ok. No drainage from penrose in neck.  Lab Results: Recent Labs    06/03/18 0534  06/03/18 1744 06/04/18 0412  WBC 15.4*  --   --  6.8  HGB 13.6   < > 12.9* 9.7*  HCT 39.5   < > 38.0* 28.7*  PLT 298  --   --  186   < > = values in this interval not displayed.   BMET:  Recent Labs    06/03/18 0253  06/03/18 1744 06/04/18 0412  NA 136   < > 134* 135  K 4.4   < > 3.0* 3.3*  CL 96*  --   --  98  CO2 28  --   --  28  GLUCOSE 124*  --   --  237*  BUN <5*  --   --  7  CREATININE 0.69  --   --  0.77  CALCIUM 8.9  --   --  7.6*   < > = values in this interval not displayed.    PT/INR: No results for  input(s): LABPROT, INR in the last 72 hours. ABG    Component Value Date/Time   PHART 7.416 06/03/2018 1744   HCO3 30.5 (H) 06/03/2018 1744   TCO2 32 06/03/2018 1744   ACIDBASEDEF 3.0 (H) 05/30/2018 0400   O2SAT 99.0 06/03/2018 1744   CBG (last 3)  Recent Labs    06/03/18 2347 06/04/18 0349 06/04/18 0802  GLUCAP 89 128* 132*   CXR: bilateral diffuse air space disease stable. Small bilateral effusion and bibasilar atelectasis. NG in adequate position for intrathoracic stomach.  Assessment/Plan: S/P Procedure(s) (LRB): TRANSHIATAL TOTAL ESOPHAGECTOMY (N/A) PYLOROMYOTOMY (N/A) FEEDING JEJUNOSTOMY (N/A) CERVICOESOPHAGO GASTROSTOMY (Right) Flexible Bronchoscopy LEFT CHEST TUBE INSERTION (Left)  Hemodynamically stable in sinus rhythm. BP ok right now but tendency to HTN. Will use nicardipine as needed for HTN> 160.  Aspiration pneumonia with acute hypoxemic respiratory failure: intubated yesterday by CCM. Oxygenation better today, CXR stable. Per CCM plan extubation today. Will need to keep HOB > 45 degrees. Continue Vanc and Zosyn. Cultures pending. WBC back to normal range.  Continue J-tube feeds.  OOB to chair as tolerated after extubation.  IS.  Mild volume excess: wt is about 4 lbs over preop.   Hypokalemia: replete.     LOS: 6 days    Gaye Pollack 06/04/2018

## 2018-06-04 NOTE — Procedures (Signed)
Intubation Procedure Note Aaron Wang [id number] [birth year]  Procedure: Intubation Indications: Respiratory insufficiency  Procedure Details Consent: Unable to obtain consent because of emergent medical necessity. Time Out: Verified patient identification, verified procedure, site/side was marked, verified correct patient position, special equipment/implants available, medications/allergies/relevent history reviewed, required imaging and test results available.  Performed  Maximum sterile technique was used including gloves, hand hygiene and mask.  MAC    Evaluation Hemodynamic Status: BP stable throughout; O2 sats: stable throughout Patient's Current Condition: stable Complications: No apparent complications Patient did tolerate procedure well. Chest X-ray ordered to verify placement.  CXR: pending.   Aaron Wang 06/04/2018

## 2018-06-04 NOTE — Progress Notes (Signed)
2  NAME:  Aaron Wang, MRN:  [id number], DOB:  [birth year], LOS: 6 ADMISSION DATE:  05/29/2018, CONSULTATION DATE:  06/01/2018 REFERRING MD:  Cyndia Bent, CHIEF COMPLAINT:  Dyspnea   Brief History   63 year old male status post esophagectomy on May 29, 2018 developed worsening acute respiratory failure with hypoxemia requiring intubation on June 03, 2018.  History of present illness   This is a pleasant 63 year old male who smokes cigarettes who [facility] with the finding of squamous cell carcinoma of the esophagus.  On May 29, 2018 he underwent an elective bronchoscopy, transhiatal total esophagectomy with cervical esophagogastrostomy, pyloromyotomy, feeding jejunostomy tube, placement of left chest tube.  Postoperatively he initially did well but over the course of 48 hours from February 20 through February 22nd he had hypertension, increasing shortness of breath.  He says that he has some pain in his abdomen when he coughs.  His cough is dry.  He has been coughing up some mucus, he does not recall a severe nausea or vomiting episode in the last few days.  Past Medical History  Gastroesophageal reflux disease, hypertension, squamous cell carcinoma of the esophagus, carpal tunnel syndrome  Significant Hospital Events   February 17 admission, esophagogastrogastrostomy  Consults:  Pulmonary and critical care medicine on June 03, 2018  Procedures:  February 17th esophagogastrogastrastostomy February 22 PICC line February 22 endotracheal tube Feb 22 bronchoscopy: gastric secretions in trachea  Significant Diagnostic Tests:  June 03, 2018 echocardiogram LVEF 60 to 65%, mildly increased left ventricular wall thickness, impaired relaxation, RVSP 36, normal RV systolic function, pericardial effusion posterior to the left ventricle, mild to moderate tricuspid regurgitation, mild calcification of aortic valve, moderate left effusion  Micro Data:   February 22 blood culture February 22 respiratory culture  Antimicrobials:  February 22 Zosyn February 22 vancomycin  Interim history/subjective:  Alert and interactive this AM, no new complaints Tolerating wean well  Objective   Blood pressure (!) 152/85, pulse (!) 110, temperature 97.6 F (36.4 C), temperature source Oral, resp. rate (!) 24, height _0  (1.702 m), weight 60.7 kg, SpO2 96 %.    Vent Mode: PRVC FiO2 (%):  [40 %-100 %] 40 % Set Rate:  [16 bmp] 16 bmp Vt Set:  [530 mL] 530 mL PEEP:  [8 cmH20] 8 cmH20 Plateau Pressure:  [20 QMV78-46 cmH20] 21 cmH20   Intake/Output Summary (Last 24 hours) at 06/04/2018 1151 Last data filed at 06/04/2018 0820 Gross per 24 hour  Intake 2816.92 ml  Output 2245 ml  Net 571.92 ml   Filed Weights   05/29/18 0604 05/31/18 0500 06/01/18 0600  Weight: 58.5 kg 60.1 kg 60.7 kg    Examination:  General:  Chronically ill appearing male, NA HENT: Surgical scar left neck well dressed, dry OP clear PULM: Coarse BS diffusely CV: RRR, Nl S1/S2 and -M/R/G GI: no bowel sounds, midline dressing in place MSK: normal bulk and tone Neuro: Awake and interactive, moving all ext to command  Resolved Hospital Problem list     Assessment & Plan:  Acute respiratory failure with hypoxemia: Chest x-ray shows diffuse bilateral infiltrates, I worry this represents aspiration pneumonitis versus healthcare associated pneumonia.  He has been adequately diuresed and has not improved. Bronchoscopy suggestive of aspiration - Begin PS trials, will extubate after FiO2 drops and patient is more stable from an aspiration standpoint - Titrate O2 for sat of 88-92% - Ventilator associated pneumonia prevention protocol - Daily wake-up assessment/SBT - Continue vancomycin/Zosyn  Hypertension: Echo  OK - Continue nicardipine drip titrated to SBP < 160  HCAP: - F/U on culture - Continue vanc/zosyn  Left pleural effusion - Defer to CVTS at this  point  Esophageal cancer - Per TCTS  Best practice:  Diet: NPO Pain/Anxiety/Delirium protocol (if indicated): yes, PAD protocol, precedex, prn fentanyl, prn versed VAP protocol (if indicated):  DVT prophylaxis: SCD GI prophylaxis: Famotidine Glucose control: SSI Mobility: Bed rest Code Status: full Family Communication: updated daughter by phone at time of intubation Disposition: Full  Labs   CBC: Recent Labs  Lab 05/31/18 0440 06/01/18 0358 06/02/18 0317 06/03/18 0534 06/03/18 1526 06/03/18 1744 06/04/18 0412  WBC 9.7 8.7 6.3 15.4*  --   --  6.8  HGB 11.7* 11.0* 10.8* 13.6 13.6 12.9* 9.7*  HCT 35.1* 33.7* 32.9* 39.5 40.0 38.0* 28.7*  MCV 93.4 92.8 91.1 88.6  --   --  91.1  PLT 205 208 223 298  --   --  101    Basic Metabolic Panel: Recent Labs  Lab 05/31/18 0440 06/01/18 0358 06/02/18 0317 06/03/18 0253 06/03/18 1526 06/03/18 1744 06/04/18 0412  NA 136 134* 135 136 133* 134* 135  K 3.5 3.2* 3.3* 4.4 3.0* 3.0* 3.3*  CL 105 103 103 96*  --   --  98  CO2 _0 --   --  28  GLUCOSE 129* 105* 85 124*  --   --  237*  BUN <5* <5* <5* <5*  --   --  7  CREATININE 0.70 0.64 0.66 0.69  --   --  0.77  CALCIUM 8.1* 8.1* 8.2* 8.9  --   --  7.6*  MG 1.8  --   --   --   --   --   --    GFR: Estimated Creatinine Clearance: 84.3 mL/min (by C-G formula based on SCr of 0.77 mg/dL). Recent Labs  Lab 06/01/18 0358 06/02/18 0317 06/03/18 0534 06/03/18 1155 06/03/18 1424 06/04/18 0412  WBC 8.7 6.3 15.4*  --   --  6.8  LATICACIDVEN  --   --   --  1.1 1.1  --     Liver Function Tests: No results for input(s): AST, ALT, ALKPHOS, BILITOT, PROT, ALBUMIN in the last 168 hours. No results for input(s): LIPASE, AMYLASE in the last 168 hours. No results for input(s): AMMONIA in the last 168 hours.  ABG    Component Value Date/Time   PHART 7.416 06/03/2018 1744   PCO2ART 47.4 06/03/2018 1744   PO2ART 147.0 (H) 06/03/2018 1744   HCO3 30.5 (H) 06/03/2018 1744    TCO2 32 06/03/2018 1744   ACIDBASEDEF 3.0 (H) 05/30/2018 0400   O2SAT 99.0 06/03/2018 1744     Coagulation Profile: No results for input(s): INR, PROTIME in the last 168 hours.  Cardiac Enzymes: Recent Labs  Lab 06/03/18 1155  TROPONINI <0.03    HbA1C: No results found for: HGBA1C  CBG: Recent Labs  Lab 06/03/18 1947 06/03/18 2347 06/04/18 0349 06/04/18 0802 06/04/18 1131  GLUCAP 139* 89 128* 132* 129*    Review of Systems:   Cannot obtain due to respiratory failure  Past Medical History  He,  has a past medical history of Back pain, Carpal tunnel syndrome, Esophageal cancer ([place]), GERD (gastroesophageal reflux disease), Hypertension, and Injury.   Surgical History    Past Surgical History:  Procedure Laterality Date  . BACK SURGERY  1983   L 4 L5 disectomy  . CHEST TUBE INSERTION Left  05/29/2018   Procedure: LEFT CHEST TUBE INSERTION;  Surgeon: [name], MD;  Location: [place];  Service: Thoracic;  Laterality: Left;  . COMPLETE ESOPHAGECTOMY N/A 05/29/2018   Procedure: TRANSHIATAL TOTAL ESOPHAGECTOMY;  Surgeon: [name], MD;  Location: [place];  Service: Thoracic;  Laterality: N/A;  . FLEXIBLE BRONCHOSCOPY  05/29/2018   Procedure: Flexible Bronchoscopy;  Surgeon: [name], MD;  Location: [place];  Service: Thoracic;;  . JEJUNOSTOMY N/A 05/29/2018   Procedure: FEEDING JEJUNOSTOMY;  Surgeon: [name], MD;  Location: [place];  Service: Thoracic;  Laterality: N/A;  . PYLOROPLASTY N/A 05/29/2018   Procedure: Edwena Blow;  Surgeon: [name], MD;  Location: [place];  Service: Thoracic;  Laterality: N/A;  . THORACOTOMY Right 05/29/2018   Procedure: CERVICOESOPHAGO GASTROSTOMY;  Surgeon: [name], MD;  Location: [place];  Service: Thoracic;  Laterality: Right;  . WRIST SURGERY Bilateral    carpal tunnel     Social History   reports that he has been smoking. He has a 1.75 pack-year smoking history. He has never used  smokeless tobacco. He reports current alcohol use of about 4.0 standard drinks of alcohol per week. He reports that he does not use drugs.   Family History   His family history includes Heart attack in his mother. There is no history of Colon cancer.   Allergies No Known Allergies   Home Medications  Prior to Admission medications   Medication Sig Start Date End Date Taking? Authorizing Provider  ibuprofen (ADVIL,MOTRIN) 800 MG tablet Take 1 tablet (800 mg total) by mouth 3 (three) times daily. Patient taking differently: Take 800 mg by mouth every 8 (eight) hours as needed for mild pain.  03/27/15  Yes Montine Circle, PA-C  lisinopril-hydrochlorothiazide (PRINZIDE) 20-12.5 MG per tablet Take 1 tablet by mouth daily. 04/13/12  Yes Domenic Moras, PA-C  traMADol (ULTRAM) 50 MG tablet Take 1 tablet (50 mg total) by mouth every 12 (twelve) hours as needed. Patient taking differently: Take 50 mg by mouth every 12 (twelve) hours as needed (pain).  04/26/18  Yes Owens Shark, NP    The patient is critically ill with multiple organ systems failure and requires high complexity decision making for assessment and support, frequent evaluation and titration of therapies, application of advanced monitoring technologies and extensive interpretation of multiple databases.   Critical Care Time devoted to patient care services described in this note is  32  Minutes. This time reflects time of care of this signee Dr [name]. This critical care time does not reflect procedure time, or teaching time or supervisory time of PA/NP/Med student/Med Resident etc but could involve care discussion time.  Rush Farmer, M.D. [facility] Pulmonary/Critical Care Medicine. Pager: [phone]. After hours pager: [phone].

## 2018-06-04 NOTE — Progress Notes (Signed)
Patient ID: Aaron Wang, male   DOB: [birth year], 63 y.o.   MRN: [id number] TCTS:  Patient extubated this am and looked ok initially but developed increasing oxygen requirement with rapid desaturation when he removed oxygen. He had a couple episodes of bradycardia into the 30's and then became unresponsive. Intubated by CCM. Sats now 100%. CXR pending. Continue antibiotics and supportive care for suspected aspiration pneumonia.

## 2018-06-04 NOTE — Progress Notes (Signed)
Deterding, MD notified about urine output. Verbal orders to continue to watch, due to diuresing during AM shift.

## 2018-06-04 NOTE — Procedures (Signed)
CPR Note  Patient suffered an asystolic arrest after laying flat for intubation.  CPR was started and atropine given with ROSC after 2 minutes.  See code sheet.  Rush Farmer, M.D. [facility] Pulmonary/Critical Care Medicine. Pager: [phone]. After hours pager: [phone]

## 2018-06-05 ENCOUNTER — Inpatient Hospital Stay (HOSPITAL_COMMUNITY): Payer: Medicaid Other

## 2018-06-05 DIAGNOSIS — I462 Cardiac arrest due to underlying cardiac condition: Secondary | ICD-10-CM

## 2018-06-05 DIAGNOSIS — R001 Bradycardia, unspecified: Secondary | ICD-10-CM

## 2018-06-05 LAB — GLUCOSE, CAPILLARY
Glucose-Capillary: 104 mg/dL — ABNORMAL HIGH (ref 70–99)
Glucose-Capillary: 127 mg/dL — ABNORMAL HIGH (ref 70–99)
Glucose-Capillary: 143 mg/dL — ABNORMAL HIGH (ref 70–99)
Glucose-Capillary: 168 mg/dL — ABNORMAL HIGH (ref 70–99)
Glucose-Capillary: 135 mg/dL — ABNORMAL HIGH (ref 70–99)

## 2018-06-05 LAB — BASIC METABOLIC PANEL
Anion gap: 9 (ref 5–15)
BUN: 18 mg/dL (ref 6–20)
CO2: 24 mmol/L (ref 22–32)
Calcium: 7.2 mg/dL — ABNORMAL LOW (ref 8.9–10.3)
Chloride: 106 mmol/L (ref 98–111)
Creatinine, Ser: 2 mg/dL — ABNORMAL HIGH (ref 0.61–1.24)
GFR calc Af Amer: 41 mL/min — ABNORMAL LOW (ref >60)
GFR calc non Af Amer: 35 mL/min — ABNORMAL LOW (ref >60)
Glucose, Bld: 122 mg/dL — ABNORMAL HIGH (ref 70–99)
Potassium: 4 mmol/L (ref 3.5–5.1)
Sodium: 139 mmol/L (ref 135–145)

## 2018-06-05 LAB — POCT I-STAT 7, (LYTES, BLD GAS, ICA,H+H)
Acid-Base Excess: 2 mmol/L (ref 0.0–2.0)
Bicarbonate: 26.4 mmol/L (ref 20.0–28.0)
Calcium, Ion: 1.15 mmol/L (ref 1.15–1.40)
HCT: 32 % — ABNORMAL LOW (ref 39.0–52.0)
Hemoglobin: 10.9 g/dL — ABNORMAL LOW (ref 13.0–17.0)
O2 Saturation: 89 %
Patient temperature: 98.7
Potassium: 3.8 mmol/L (ref 3.5–5.1)
Sodium: 139 mmol/L (ref 135–145)
TCO2: 28 mmol/L (ref 22–32)
pCO2 arterial: 41.9 mmHg (ref 32.0–48.0)
pH, Arterial: 7.408 (ref 7.350–7.450)
pO2, Arterial: 57 mmHg — ABNORMAL LOW (ref 83.0–108.0)

## 2018-06-05 LAB — CBC
HCT: 30.5 % — ABNORMAL LOW (ref 39.0–52.0)
Hemoglobin: 10.1 g/dL — ABNORMAL LOW (ref 13.0–17.0)
MCH: 30.8 pg (ref 26.0–34.0)
MCHC: 33.1 g/dL (ref 30.0–36.0)
MCV: 93 fL (ref 80.0–100.0)
Platelets: 197 10*3/uL (ref 150–400)
RBC: 3.28 MIL/uL — ABNORMAL LOW (ref 4.22–5.81)
RDW: 13.3 % (ref 11.5–15.5)
WBC: 10.3 10*3/uL (ref 4.0–10.5)
nRBC: 0 % (ref 0.0–0.2)

## 2018-06-05 LAB — MAGNESIUM: Magnesium: 1.7 mg/dL (ref 1.7–2.4)

## 2018-06-05 LAB — PHOSPHORUS: Phosphorus: 4.1 mg/dL (ref 2.5–4.6)

## 2018-06-05 MED ORDER — SODIUM BICARBONATE 8.4 % IV SOLN
INTRAVENOUS | Status: AC
  Filled 2018-06-05: qty 100

## 2018-06-05 MED ORDER — METOPROLOL TARTRATE 25 MG/10 ML ORAL SUSPENSION
12.5000 mg | Freq: Two times a day (BID) | ORAL | Status: DC
  Administered 2018-06-06 – 2018-06-07 (×3): 12.5 mg
  Filled 2018-06-05 (×3): qty 5

## 2018-06-05 MED ORDER — ATROPINE SULFATE 1 MG/10ML IJ SOSY
PREFILLED_SYRINGE | INTRAMUSCULAR | Status: AC
  Filled 2018-06-05: qty 10

## 2018-06-05 MED ORDER — VANCOMYCIN VARIABLE DOSE PER UNSTABLE RENAL FUNCTION (PHARMACIST DOSING): Status: DC

## 2018-06-05 MED FILL — Medication: Qty: 1 | Status: AC

## 2018-06-05 NOTE — Consult Note (Addendum)
CARDIOLOGY CONSULT NOTE    Patient ID: Aaron Wang; [id number]; 1955-10-15   Admit date: 05/29/2018 Date of Consult: 06/05/2018  Primary Care Provider: Nolene Ebbs, MD Primary Cardiologist: Dr. Irish Wang  Primary Electrophysiologist:  None   Patient Profile:   Aaron Wang is a 63 y.o. male with a hx of HTN and esophageal cancer who is being seen today for the evaluation of bradycardia.  History of Present Illness:   [name] presented to the hospital on 05/29/2018 for transhiatal total esophagectomy with cervical esophagogastrostomy for squamous cell carcinoma of the esophagus. He was initially doing well s/p surgery but on 06/01/2018 developed worsening hypoxic respiratory failure, HTN, and SHOB. He was subsequently intubated on 06/03/2018. He was then extubated on 06/04/2018 however, was unable to maintain oxygenation and subsequnetly reintubated on 06/04/2018.   Today the patient is resting comfortably. He is intubated. He is arousable and attempts to communicate. He is following commands. Denies chest pain. Endorses SHOB. Otherwise ROS negative.   Past Medical History:  Diagnosis Date  . Back pain   . Carpal tunnel syndrome   . Esophageal cancer ([place])   . GERD (gastroesophageal reflux disease)   . Hypertension   . Injury    tore ligaments left knee   Past Surgical History:  Procedure Laterality Date  . BACK SURGERY  1983   L 4 L5 disectomy  . CHEST TUBE INSERTION Left 05/29/2018   Procedure: LEFT CHEST TUBE INSERTION;  Surgeon: [name], MD;  Location: [place];  Service: Thoracic;  Laterality: Left;  . COMPLETE ESOPHAGECTOMY N/A 05/29/2018   Procedure: TRANSHIATAL TOTAL ESOPHAGECTOMY;  Surgeon: [name], MD;  Location: [place];  Service: Thoracic;  Laterality: N/A;  . FLEXIBLE BRONCHOSCOPY  05/29/2018   Procedure: Flexible Bronchoscopy;  Surgeon: [name], MD;  Location: [place];  Service: Thoracic;;  . JEJUNOSTOMY N/A 05/29/2018   Procedure: FEEDING JEJUNOSTOMY;  Surgeon: [name], MD;  Location: [place];  Service: Thoracic;  Laterality: N/A;  . PYLOROPLASTY N/A 05/29/2018   Procedure: Edwena Blow;  Surgeon: [name], MD;  Location: [place];  Service: Thoracic;  Laterality: N/A;  . THORACOTOMY Right 05/29/2018   Procedure: CERVICOESOPHAGO GASTROSTOMY;  Surgeon: [name], MD;  Location: [place];  Service: Thoracic;  Laterality: Right;  . WRIST SURGERY Bilateral    carpal tunnel    Home Medications:  Prior to Admission medications   Medication Sig Start Date End Date Taking? Authorizing Provider  ibuprofen (ADVIL,MOTRIN) 800 MG tablet Take 1 tablet (800 mg total) by mouth 3 (three) times daily. Patient taking differently: Take 800 mg by mouth every 8 (eight) hours as needed for mild pain.  03/27/15  Yes Montine Circle, PA-C  lisinopril-hydrochlorothiazide (PRINZIDE) 20-12.5 MG per tablet Take 1 tablet by mouth daily. 04/13/12  Yes Domenic Moras, PA-C  traMADol (ULTRAM) 50 MG tablet Take 1 tablet (50 mg total) by mouth every 12 (twelve) hours as needed. Patient taking differently: Take 50 mg by mouth every 12 (twelve) hours as needed (pain).  04/26/18  Yes Owens Shark, NP   Inpatient Medications: Scheduled Meds: . atropine      . chlorhexidine gluconate (MEDLINE KIT)  15 mL Mouth Rinse BID  . Chlorhexidine Gluconate Cloth  6 each Topical Daily  . feeding supplement (JEVITY 1.2 CAL)  1,000 mL Per Tube Q24H  . insulin aspart  0-24 Units Subcutaneous Q4H  . mouth rinse  15 mL Mouth Rinse 10 times  per day  . metoprolol tartrate  12.5 mg Per Tube BID  . pantoprazole (PROTONIX) IV  40 mg Intravenous Q12H  . sodium chloride flush  10-40 mL Intracatheter Q12H   Continuous Infusions: . sodium chloride Stopped (06/03/18 0742)  . sodium chloride Stopped (06/04/18 1612)  . dexmedetomidine (PRECEDEX) IV infusion 0.2 mcg/kg/hr (06/05/18 1200)  . dextrose 5 % and 0.9% NaCl 50 mL/hr at 06/05/18 1200  .  famotidine (PEPCID) IV Stopped (06/05/18 0844)  . fentaNYL infusion INTRAVENOUS 100 mcg/hr (06/05/18 1200)  . niCARDipine Stopped (06/05/18 0701)  . piperacillin-tazobactam (ZOSYN)  IV 12.5 mL/hr at 06/05/18 1200  . potassium chloride Stopped (06/01/18 0956)  . vancomycin 166.7 mL/hr at 06/05/18 0800   PRN Meds: sodium chloride, oxyCODONE **AND** acetaminophen, fentaNYL (SUBLIMAZE) injection, fentaNYL (SUBLIMAZE) injection, midazolam, midazolam, ondansetron (ZOFRAN) IV, potassium chloride, sodium chloride flush  Allergies:   No Known Allergies  Social History:   Social History   Socioeconomic History  . Marital status: Single    Spouse name: Not on file  . Number of children: Not on file  . Years of education: Not on file  . Highest education level: Not on file  Occupational History  . Not on file  Social Needs  . Financial resource strain: Not on file  . Food insecurity:    Worry: Not on file    Inability: Not on file  . Transportation needs:    Medical: Yes    Non-medical: Yes  Tobacco Use  . Smoking status: Current Every Day Smoker    Packs/day: 0.25    Years: 7.00    Pack years: 1.75  . Smokeless tobacco: Never Used  Substance and Sexual Activity  . Alcohol use: Yes    Alcohol/week: 4.0 standard drinks    Types: 4 Cans of beer per week  . Drug use: No  . Sexual activity: Not Currently  Lifestyle  . Physical activity:    Days per week: Not on file    Minutes per session: Not on file  . Stress: Not on file  Relationships  . Social connections:    Talks on phone: Not on file    Gets together: Not on file    Attends religious service: Not on file    Active member of club or organization: Not on file    Attends meetings of clubs or organizations: Not on file    Relationship status: Not on file  . Intimate partner violence:    Fear of current or ex partner: Not on file    Emotionally abused: Not on file    Physically abused: Not on file    Forced sexual  activity: Not on file  Other Topics Concern  . Not on file  Social History Narrative  . Not on file    Family History:   Family History  Problem Relation Age of Onset  . Heart attack Mother   . Colon cancer Neg Hx     ROS:  Performed and all others negative.  Physical Exam/Data:   Vitals:   06/05/18 1100 06/05/18 1127 06/05/18 1200 06/05/18 1215  BP: 132/80  133/75 133/75  Pulse: 79  69 69  Resp: _0 Temp:  97.7 F (36.5 C)    TempSrc:  Oral    SpO2: 96%  99% 100%  Weight:      Height:        Intake/Output Summary (Last 24 hours) at 06/05/2018 1403 Last data filed at 06/05/2018  1200 Gross per 24 hour  Intake 2951.06 ml  Output 446 ml  Net 2505.06 ml   Filed Weights   05/31/18 0500 06/01/18 0600 06/05/18 0515  Weight: 60.1 kg 60.7 kg 59.8 kg   Body mass index is 20.65 kg/m.   General:  Well nourished, well developed, in no acute distress HEENT: Normal Lymph: No adenopathy Neck: No JVD Endocrine:  No thryomegaly Vascular: No carotid bruits; FA pulses 2+ bilaterally without bruits  Cardiac:  Normal S1, S2; RRR; no murmur  Lungs: Rhonchi throughout Abd: Soft, nontender, no hepatomegaly  Ext: No edema Musculoskeletal:  No deformities, BUE and BLE strength normal and equal Skin: Warm and dry  Neuro:  CNs 2-12 intact, no focal abnormalities noted Psych:  Normal affect   EKG:  The EKG was personally reviewed and demonstrates: NSR with LVH  Telemetry:  Telemetry was personally reviewed and demonstrates: NSR with occasional episodes of bradycardia.   Relevant CV Studies:  TTE 06/03/2018  1. The left ventricle has normal systolic function with an ejection fraction of 60-65%. The cavity size was normal. There is mildly increased left ventricular wall thickness. Left ventricular diastolic Doppler parameters are consistent with impaired  relaxation No evidence of left ventricular regional wall motion abnormalities.  2. The right ventricle has normal systolic  function. The cavity was normal. There is no increase in right ventricular wall thickness. Estimated RVSP 36.4 mmHg.  3. The pericardial effusion is posterior to the left ventricle.  4. Trivial pericardial effusion is present.  5. The aortic valve is tricuspid. Mild calcification of the aortic valve.. Mild to moderate aortic annular calcification noted.  6. The mitral valve is normal in structure.  7. The tricuspid valve is normal in structure. Tricuspid valve regurgitation is mild-moderate.  8. The aortic root is normal in size and structure.  9. Moderate pleural effusion in the left lateral region.  Laboratory Data:  Chemistry Recent Labs  Lab 06/03/18 0253  06/04/18 0412 06/05/18 0545 06/05/18 0549  NA 136   < > 135 139 139  K 4.4   < > 3.3* 4.0 3.8  CL 96*  --  98 106  --   CO2 28  --  28 24  --   GLUCOSE 124*  --  237* 122*  --   BUN <5*  --  7 18  --   CREATININE 0.69  --  0.77 2.00*  --   CALCIUM 8.9  --  7.6* 7.2*  --   GFRNONAA >60  --  >60 35*  --   GFRAA >60  --  >60 41*  --   ANIONGAP 12  --  9 9  --    < > = values in this interval not displayed.    No results for input(s): PROT, ALBUMIN, AST, ALT, ALKPHOS, BILITOT in the last 168 hours.   Hematology Recent Labs  Lab 06/03/18 0534  06/04/18 0412 06/05/18 0426 06/05/18 0549  WBC 15.4*  --  6.8 10.3  --   RBC 4.46  --  3.15* 3.28*  --   HGB 13.6   < > 9.7* 10.1* 10.9*  HCT 39.5   < > 28.7* 30.5* 32.0*  MCV 88.6  --  91.1 93.0  --   MCH 30.5  --  30.8 30.8  --   MCHC 34.4  --  33.8 33.1  --   RDW 12.6  --  13.2 13.3  --   PLT 298  --  186 197  --    < > =  values in this interval not displayed.   Cardiac Enzymes Recent Labs  Lab 06/03/18 1155  TROPONINI <0.03   No results for input(s): TROPIPOC in the last 168 hours.   BNPNo results for input(s): BNP, PROBNP in the last 168 hours.   DDimer No results for input(s): DDIMER in the last 168 hours.  Radiology/Studies:  Dg Chest Port 1 View  Result  Date: 06/05/2018 CLINICAL DATA:  Respiratory failure.  ETT. EXAM: PORTABLE CHEST 1 VIEW COMPARISON:  June 04, 2018 FINDINGS: The ETT terminates 3 cm above the carina, in good position. The side port of the NG tube is likely near the GE junction. The right PICC line terminates near the caval atrial junction. No pneumothorax. Bilateral pleural effusions remain. Diffuse bilateral pulmonary infiltrates are similar in the interval. No other interval changes. IMPRESSION: 1. Bilateral pleural effusions with underlying opacities remain. 2. Diffuse bilateral pulmonary infiltrates, possibly edema. An infectious process is not completely excluded based on imaging. 3. Support apparatus as above. Electronically Signed   By: Dorise Bullion III M.D   On: 06/05/2018 08:29   Portable Chest X-ray  Result Date: 06/04/2018 CLINICAL DATA:  Intubated EXAM: PORTABLE CHEST 1 VIEW COMPARISON:  Chest radiograph from earlier today. FINDINGS: Endotracheal tube tip is 2.0 cm above the carina. Enteric tube terminates in the proximal stomach with the side port at the esophagogastric junction. Right subclavian central venous catheter terminates over the right atrium. Pacer pads overlie the left chest. Stable cardiomediastinal silhouette with normal heart size. No pneumothorax. Small stable bilateral pleural effusions. Severe hazy and linear opacities throughout the parahilar lungs, not appreciably changed. Bibasilar atelectasis. IMPRESSION: 1. Endotracheal tube tip 2.0 cm above the carina. 2. Enteric tube terminates in the proximal stomach with the side port at the esophagogastric junction, consider advancing 5 cm. 3. Stable severe hazy and linear opacities throughout the parahilar lungs, favor severe pulmonary edema. 4. Stable small bilateral pleural effusions and bibasilar atelectasis. Electronically Signed   By: Ilona Sorrel M.D.   On: 06/04/2018 17:50   Dg Chest Port 1 View  Result Date: 06/04/2018 CLINICAL DATA:  Status post  esophagectomy 05/29/2018. Repositioning of NG tube. EXAM: PORTABLE CHEST 1 VIEW COMPARISON:  Single-view of the chest 06/03/2018 and 06/02/2018. FINDINGS: Support tubes and lines, including the patient's NG tube, are unchanged in position. Extensive bilateral airspace disease persists and has worsened on the right since yesterday's exam. There are bilateral pleural effusions. Heart size is normal. No pneumothorax. Aortic atherosclerosis noted. IMPRESSION: Support tubes and lines are unchanged in position. Extensive bilateral airspace disease persists. Aeration in the right chest is worsened. Electronically Signed   By: Inge Rise M.D.   On: 06/04/2018 11:10   Portable Chest X-ray  Result Date: 06/03/2018 CLINICAL DATA:  ET tube placement EXAM: PORTABLE CHEST 1 VIEW COMPARISON:  06/03/2018 FINDINGS: Endotracheal tube is 4 cm above the carina. Right PICC line has been placed with the tip in the SVC. NG tube tip is in the distal esophagus, stable. Heart is normal size. Bilateral interstitial and airspace opacities slightly improved since prior study. Moderate left effusion and small right effusion are stable. IMPRESSION: Endotracheal tube tip is 4 cm above the carina. Right PICC line tip in the SVC. NG tube tip remains in the distal esophagus. Diffuse bilateral interstitial and airspace opacities slightly improved. Stable effusions. Electronically Signed   By: Rolm Baptise M.D.   On: 06/03/2018 17:03   Dg Chest Port 1 View  Result Date: 06/03/2018 CLINICAL  DATA:  History of esophagectomy. EXAM: PORTABLE CHEST 1 VIEW COMPARISON:  06/02/2018 FINDINGS: No change in the bilateral airspace/interstitial lung densities. Evidence for bilateral pleural effusions, left side greater than right. Nasogastric tube extends into the upper abdomen and appears stable. Stable volume loss in left lower lobe with air bronchograms. Negative for pneumothorax. Heart size appears to be stable but obscured by the lower chest  densities. IMPRESSION: 1. Persistent bilateral parenchymal lung disease with bilateral pleural effusions. Lung densities have not significantly changed. 2. Stable position of the NG tube. Electronically Signed   By: Markus Daft M.D.   On: 06/03/2018 08:51   Dg Chest Port 1 View  Result Date: 06/03/2018 CLINICAL DATA:  63 y/o  M; increasing shortness of breath tonight. EXAM: PORTABLE CHEST 1 VIEW COMPARISON:  06/01/2018 chest radiograph FINDINGS: Stable cardiac silhouette given projection and technique. Aortic atherosclerosis. Interval removal of right central venous catheter. No residual left-sided pneumothorax identified. Increased diffuse airspace opacities and left-greater-than-right pleural effusions. Enteric tube tip extends below the field of view. Bones are unremarkable. IMPRESSION: Interval removal of right central venous catheter. No residual left-sided pneumothorax identified. Increased diffuse airspace opacities and left-greater-than-right pleural effusions. Electronically Signed   By: Kristine Garbe M.D.   On: 06/03/2018 00:03   Dg Abd Portable 1v  Result Date: 06/03/2018 CLINICAL DATA:  Shortness of breath. EXAM: PORTABLE ABDOMEN - 1 VIEW COMPARISON:  Chest x-ray earlier same day FINDINGS: Airspace disease noted right base with left pleural effusion evident. NG tube tip is positioned in the region of the esophagogastric junction. Jejunostomy tube overlies the left abdomen. No gaseous bil dilatation to suggest obstruction. IMPRESSION: 1. Airspace disease in the right base with left pleural effusion. 2. NG tube tip is at the esophagogastric junction. Jejunostomy tube overlies the left abdomen. 3. No evidence for bowel obstruction. Electronically Signed   By: Misty Stanley M.D.   On: 06/03/2018 14:17   [place] Ekg Site Rite  Result Date: 06/03/2018 If [facility] image not attached, placement could not be confirmed due to current cardiac rhythm.  Assessment and Plan:   MARIAH HARN is a  63 y.o. male with a hx of HTN and esophageal cancer who is being seen today for the evaluation of bradycardia.  Sinus Bradycardia  - Reviewed tele. Appears to be sinus bradycardia. No evidence of heart block or atrial arhythmia  - No evidence of electrolyte causes, increased intracranial pressure, or ischemia  - Metoprolol Tartrate 100 mg discontinued on 06/04/2018 after 10pm dose  - Diltiazem drip discontinued on 06/04/2018 at ~0700 - Dexmedetomidine drip discontinued today at at ~1300 - Still on Fentanyl drip  - The patient's bradycardia is likely iatrogenic in nature but there may also be an aspect of hypoxia. PCCM has already started to discontinue these medications.   Will discuss the case further with Dr. Irish Wang.   For questions or updates, please contact [place] Please consult [email or web address] for contact info under Cardiology/STEMI.   Signed, Ina Homes, MD 06/05/2018 2:03 PM   I have examined the patient and reviewed assessment and plan and discussed with patient.  Agree with above as stated.  Transient bradycardia.  Review of tele shows marked sinus bradycardia.  QRS complex remains narow.    Metoprolol, diltiazem and Precedex have been stopped.  All of these can cause bradycardia.  Events occur with moving the patient.  Possible vagal stimulation with coughing as well.   He is now awake and able to write coherent questions.  1) COntinue to hold rate slowing agents.   2) No indication for temp pacer at this time.  If bradycardia worsens, will reconsider.  Narrow QRS is reassuring as well.  3) WIll follow.   Larae Grooms

## 2018-06-05 NOTE — Progress Notes (Addendum)
ABG RESULTS 0549 ON 06/05/2018 ARE 7.40, 41.9 PCO2, 57-PaO2, 26.4 HCO3, 89 SAT. ABG COLLECTED ON LR AND PT TEMP WAS 98.7.   EDIT SHEET SENT TO POINT OF CARE DUE TO ABG RAN WITH WRONG PT LABEL. RN AWARE

## 2018-06-05 NOTE — Progress Notes (Signed)
[facility] BRIEF SICU PROGRESS NOTE  7 Days Post-Op  S/P Procedure(s) (LRB): TRANSHIATAL TOTAL ESOPHAGECTOMY (N/A) PYLOROMYOTOMY (N/A) FEEDING JEJUNOSTOMY (N/A) CERVICOESOPHAGO GASTROSTOMY (Right) Flexible Bronchoscopy LEFT CHEST TUBE INSERTION (Left)   Stable day No further episodes bradycardia and both metoprolol and diltiazem have been stopped O2 sats 99-100% on FiO2 50% and PEEP 10 UOP adequate  Plan: Continue current plan  Rexene Alberts, MD 06/05/2018 6:36 PM

## 2018-06-05 NOTE — Progress Notes (Signed)
Pharmacy Antibiotic Note  Aaron Wang is a 63 y.o. male admitted on 05/29/2018 with pneumonia.  Pharmacy has been consulted for vancomycin and zosyn dosing. -WBC= 10.3, afebrile, SCr 0.77>> 2.0 -cultures- ngtd  Plan: Zosyn 3.375g IV q8h (4 hour infusion).  Discontinue vancomycin due to SCr trend Vancomycin level on 2/24 in am  Height: _0  (170.2 cm) Weight: 131 lb 13.4 oz (59.8 kg) IBW/kg (Calculated) : 66.1  Temp (24hrs), Avg:98.2 F (36.8 C), Min:97.7 F (36.5 C), Max:98.6 F (37 C)  Recent Labs  Lab 06/01/18 0358 06/02/18 0317 06/03/18 0253 06/03/18 0534 06/03/18 1155 06/03/18 1424 06/04/18 0412 06/05/18 0426 06/05/18 0545  WBC 8.7 6.3  --  15.4*  --   --  6.8 10.3  --   CREATININE 0.64 0.66 0.69  --   --   --  0.77  --  2.00*  LATICACIDVEN  --   --   --   --  1.1 1.1  --   --   --     Estimated Creatinine Clearance: 33.2 mL/min (A) (by C-G formula based on SCr of 2 mg/dL (H)).     Vanc (used Scr 1) 2/22 >>  Zosyn 2/22>>  2/22 BAL: ngtd 2/22 Bcx: ngtd  Thank you for allowing pharmacy to be a part of this patient's care.  Hildred Laser, PharmD Clinical Pharmacist **Pharmacist phone directory can now be found on [email or web address] ([email or web address]).  Listed under [place].

## 2018-06-05 NOTE — Progress Notes (Signed)
Pt with another episode of bradycardia while moving in bed. Awake and conversing during this episode. Appears to be vagal response. Currently 90's sinus rhythm. BP stable. AO, communicating with pen/paper. MD Servando Snare and cards made aware. Dr Servando Snare to consult with cards.

## 2018-06-05 NOTE — Progress Notes (Signed)
2  NAME:  Aaron Wang, MRN:  [id number], DOB:  1955/08/09, LOS: 7 ADMISSION DATE:  05/29/2018, CONSULTATION DATE:  06/01/2018 REFERRING MD:  Cyndia Bent, CHIEF COMPLAINT:  Dyspnea   Brief History   63 year old male status post esophagectomy on May 29, 2018 developed worsening acute respiratory failure with hypoxemia requiring intubation on June 03, 2018.  History of present illness   This is a pleasant 63 year old male who smokes cigarettes who presented [facility] with the finding of squamous cell carcinoma of the esophagus.  On May 29, 2018 he underwent an elective bronchoscopy, transhiatal total esophagectomy with cervical esophagogastrostomy, pyloromyotomy, feeding jejunostomy tube, placement of left chest tube.  Postoperatively he initially did well but over the course of 48 hours from February 20 through February 22nd he had hypertension, increasing shortness of breath.  He says that he has some pain in his abdomen when he coughs.  His cough is dry.  He has been coughing up some mucus, he does not recall a severe nausea or vomiting episode in the last few days.  Past Medical History  Gastroesophageal reflux disease, hypertension, squamous cell carcinoma of the esophagus, carpal tunnel syndrome  Significant Hospital Events   February 17 admission, esophagogastrogastrostomy  Consults:  Pulmonary and critical care medicine on June 03, 2018  Procedures:  February 17th esophagogastrogastrastostomy February 22 PICC line February 22 endotracheal tube Feb 22 bronchoscopy: gastric secretions in trachea  Significant Diagnostic Tests:  June 03, 2018 echocardiogram LVEF 60 to 65%, mildly increased left ventricular wall thickness, impaired relaxation, RVSP 36, normal RV systolic function, pericardial effusion posterior to the left ventricle, mild to moderate tricuspid regurgitation, mild calcification of aortic valve, moderate left effusion  Micro Data:   February 22 blood culture February 22 respiratory culture  Antimicrobials:  February 22 Zosyn February 22 vancomycin  Interim history/subjective:  No events overnight since the cardiac arrest during laying flat, no new complaints  Objective   Blood pressure (!) 150/87, pulse 84, temperature 97.9 F (36.6 C), temperature source Oral, resp. rate 20, height _0  (1.702 m), weight 59.8 kg, SpO2 96 %.    Vent Mode: PRVC FiO2 (%):  [60 %-100 %] 70 % Set Rate:  [18 bmp] 18 bmp Vt Set:  [530 mL] 530 mL PEEP:  [5 cmH20] 5 cmH20 Plateau Pressure:  [13 cmH20-22 cmH20] 13 cmH20   Intake/Output Summary (Last 24 hours) at 06/05/2018 1059 Last data filed at 06/05/2018 0900 Gross per 24 hour  Intake 2802.35 ml  Output 452 ml  Net 2350.35 ml   Filed Weights   05/31/18 0500 06/01/18 0600 06/05/18 0515  Weight: 60.1 kg 60.7 kg 59.8 kg   Examination:  General:  Chronically ill appearing male, NAD HENT: [place]/AT, PERRL, EOM-I and MMM.  Scar noted on the lateral neck PULM: Coarse BS diffusely, decreased R>L CV: RRR, Nl S1/S2 and -M/R/G GI: Soft, NT, ND and +BS MSK: WNL Neuro: Awake and interactive, moving all ext to command  I reviewed CXR myself, infiltrate noted, ETT is in a good position  Resolved Hospital Problem list     Assessment & Plan:  Acute respiratory failure with hypoxemia: Chest x-ray shows diffuse bilateral infiltrates, I worry this represents aspiration pneumonitis versus healthcare associated pneumonia.  He has been adequately diuresed and has not improved. Bronchoscopy suggestive of aspiration - Begin PS trials but no extubation until CVTS opines on airway clearance - Titrate O2 for sat of 88-92% - Increase PEEP to 10 and FiO2  Of  60% - Ventilator associated pneumonia prevention protocol - Daily wake-up assessment/SBT - Continue vancomycin/Zosyn  Hypertension: Echo OK - D/C cardene drip - D/C lopressor - Tele monitoring  Bradycardia: - D/C lopressors - Tele  monitoring  HCAP: - F/U on culture - Continue vanc/zosyn - PCT  Left pleural effusion - Defer to CVTS at this point  Esophageal cancer - Per TCTS - Defer TF to CVTS  Best practice:  Diet: NPO Pain/Anxiety/Delirium protocol (if indicated): yes, PAD protocol, precedex, prn fentanyl, prn versed VAP protocol (if indicated):  DVT prophylaxis: SCD GI prophylaxis: Famotidine Glucose control: SSI Mobility: Bed rest Code Status: full Family Communication: updated daughter by phone at time of intubation Disposition: Full  Labs   CBC: Recent Labs  Lab 06/01/18 0358 06/02/18 0317 06/03/18 0534 06/03/18 1526 06/03/18 1744 06/04/18 0412 06/05/18 0426  WBC 8.7 6.3 15.4*  --   --  6.8 10.3  HGB 11.0* 10.8* 13.6 13.6 12.9* 9.7* 10.1*  HCT 33.7* 32.9* 39.5 40.0 38.0* 28.7* 30.5*  MCV 92.8 91.1 88.6  --   --  91.1 93.0  PLT 208 223 298  --   --  186 979    Basic Metabolic Panel: Recent Labs  Lab 05/31/18 0440 06/01/18 0358 06/02/18 0317 06/03/18 0253 06/03/18 1526 06/03/18 1744 06/04/18 0412 06/05/18 0545  NA 136 134* 135 136 133* 134* 135 139  K 3.5 3.2* 3.3* 4.4 3.0* 3.0* 3.3* 4.0  CL 105 103 103 96*  --   --  98 106  CO2 _0 --   --  28 24  GLUCOSE 129* 105* 85 124*  --   --  237* 122*  BUN <5* <5* <5* <5*  --   --  7 18  CREATININE 0.70 0.64 0.66 0.69  --   --  0.77 2.00*  CALCIUM 8.1* 8.1* 8.2* 8.9  --   --  7.6* 7.2*  MG 1.8  --   --   --   --   --   --  1.7  PHOS  --   --   --   --   --   --   --  4.1   GFR: Estimated Creatinine Clearance: 33.2 mL/min (A) (by C-G formula based on SCr of 2 mg/dL (H)). Recent Labs  Lab 06/02/18 0317 06/03/18 0534 06/03/18 1155 06/03/18 1424 06/04/18 0412 06/05/18 0426  WBC 6.3 15.4*  --   --  6.8 10.3  LATICACIDVEN  --   --  1.1 1.1  --   --     Liver Function Tests: No results for input(s): AST, ALT, ALKPHOS, BILITOT, PROT, ALBUMIN in the last 168 hours. No results for input(s): LIPASE, AMYLASE in the last  168 hours. No results for input(s): AMMONIA in the last 168 hours.  ABG    Component Value Date/Time   PHART 7.416 06/03/2018 1744   PCO2ART 47.4 06/03/2018 1744   PO2ART 147.0 (H) 06/03/2018 1744   HCO3 30.5 (H) 06/03/2018 1744   TCO2 32 06/03/2018 1744   ACIDBASEDEF 3.0 (H) 05/30/2018 0400   O2SAT 99.0 06/03/2018 1744     Coagulation Profile: No results for input(s): INR, PROTIME in the last 168 hours.  Cardiac Enzymes: Recent Labs  Lab 06/03/18 1155  TROPONINI <0.03    HbA1C: No results found for: HGBA1C  CBG: Recent Labs  Lab 06/04/18 1652 06/04/18 2008 06/04/18 2316 06/05/18 0417 06/05/18 0829  GLUCAP 106* 130* 143* 104* 127*    Review  of Systems:   Cannot obtain due to respiratory failure  Past Medical History  He,  has a past medical history of Back pain, Carpal tunnel syndrome, Esophageal cancer ([place]), GERD (gastroesophageal reflux disease), Hypertension, and Injury.   Surgical History    Past Surgical History:  Procedure Laterality Date  . BACK SURGERY  1983   L 4 L5 disectomy  . CHEST TUBE INSERTION Left 05/29/2018   Procedure: LEFT CHEST TUBE INSERTION;  Surgeon: [name], MD;  Location: [place];  Service: Thoracic;  Laterality: Left;  . COMPLETE ESOPHAGECTOMY N/A 05/29/2018   Procedure: TRANSHIATAL TOTAL ESOPHAGECTOMY;  Surgeon: [name], MD;  Location: [place];  Service: Thoracic;  Laterality: N/A;  . FLEXIBLE BRONCHOSCOPY  05/29/2018   Procedure: Flexible Bronchoscopy;  Surgeon: [name], MD;  Location: [place];  Service: Thoracic;;  . JEJUNOSTOMY N/A 05/29/2018   Procedure: FEEDING JEJUNOSTOMY;  Surgeon: [name], MD;  Location: [place];  Service: Thoracic;  Laterality: N/A;  . PYLOROPLASTY N/A 05/29/2018   Procedure: Edwena Blow;  Surgeon: [name], MD;  Location: [place];  Service: Thoracic;  Laterality: N/A;  . THORACOTOMY Right 05/29/2018   Procedure: CERVICOESOPHAGO GASTROSTOMY;  Surgeon: [name], MD;  Location: [place];  Service: Thoracic;  Laterality: Right;  . WRIST SURGERY Bilateral    carpal tunnel     Social History   reports that he has been smoking. He has a 1.75 pack-year smoking history. He has never used smokeless tobacco. He reports current alcohol use of about 4.0 standard drinks of alcohol per week. He reports that he does not use drugs.   Family History   His family history includes Heart attack in his mother. There is no history of Colon cancer.   Allergies No Known Allergies   Home Medications  Prior to Admission medications   Medication Sig Start Date End Date Taking? Authorizing Provider  ibuprofen (ADVIL,MOTRIN) 800 MG tablet Take 1 tablet (800 mg total) by mouth 3 (three) times daily. Patient taking differently: Take 800 mg by mouth every 8 (eight) hours as needed for mild pain.  03/27/15  Yes Montine Circle, PA-C  lisinopril-hydrochlorothiazide (PRINZIDE) 20-12.5 MG per tablet Take 1 tablet by mouth daily. 04/13/12  Yes Domenic Moras, PA-C  traMADol (ULTRAM) 50 MG tablet Take 1 tablet (50 mg total) by mouth every 12 (twelve) hours as needed. Patient taking differently: Take 50 mg by mouth every 12 (twelve) hours as needed (pain).  04/26/18  Yes Owens Shark, NP    The patient is critically ill with multiple organ systems failure and requires high complexity decision making for assessment and support, frequent evaluation and titration of therapies, application of advanced monitoring technologies and extensive interpretation of multiple databases.   Critical Care Time devoted to patient care services described in this note is  32  Minutes. This time reflects time of care of this signee Dr [name]. This critical care time does not reflect procedure time, or teaching time or supervisory time of PA/NP/Med student/Med Resident etc but could involve care discussion time.  Rush Farmer, M.D. [facility] Pulmonary/Critical Care Medicine. Pager:  [phone]. After hours pager: [phone].

## 2018-06-05 NOTE — Progress Notes (Signed)
0730: Per report patient with bradycardic episode overnight. Noted BB dose 100 mg, see new orders for decreased BB dose.  0930: Upon repositioning of patient, bradycardic episode noted to almost asystole-patient immediately back into NSR rate 80-90. Neuro intact, otherwise stable at this time. Did not have to pace or give atropine. Holding morning BB and decreased dex gtts to 0.4 from 1. Continue to monitor. Dr Servando Snare has been made aware. Cards to see patient again this morning for any further changes.

## 2018-06-05 NOTE — Progress Notes (Addendum)
Pt calm, cooperative, and resting for most of shift.  @ 0100 pt woke up in a panic. Trying to get up and thrashing hands around at tubes. Pt starting to brady down into the 20-30's at this time but did not sustain. [facility] RN camera in. Pt able to write that he feels like he can't breathe. Currently on 60% FiO2, no secretions when suctioned, lungs clear. Fentanyl bolus given. Currently on Precedex at 1.2 and Fentanyl gtt.   RN will continue to monitor

## 2018-06-05 NOTE — Progress Notes (Addendum)
[address]       [place] [address]             [phone]      7 Days Post-Op Procedure(s) (LRB): TRANSHIATAL TOTAL ESOPHAGECTOMY (N/A) PYLOROMYOTOMY (N/A) FEEDING JEJUNOSTOMY (N/A) CERVICOESOPHAGO GASTROSTOMY (Right) Flexible Bronchoscopy LEFT CHEST TUBE INSERTION (Left) Subjective: Alert on ventillator  Objective: Vital signs in last 24 hours: Temp:  [96.5 F (35.8 C)-98.6 F (37 C)] 98.6 F (37 C) (02/24 0000) Pulse Rate:  [0-124] 82 (02/24 0744) Cardiac Rhythm: Normal sinus rhythm (02/24 0400) Resp:  [0-35] 23 (02/24 0744) BP: (91-218)/(63-124) 138/76 (02/24 0744) SpO2:  [83 %-100 %] 93 % (02/24 0744) FiO2 (%):  [60 %-100 %] 70 % (02/24 0744) Weight:  [59.8 kg] 59.8 kg (02/24 0515)  Hemodynamic parameters for last 24 hours:   Vent Mode: PRVC FiO2 (%):  [60 %-100 %] 70 % Set Rate:  [18 bmp] 18 bmp Vt Set:  [530 mL] 530 mL PEEP:  [5 cmH20] 5 cmH20 Plateau Pressure:  [13 cmH20-22 cmH20] 13 cmH20 Intake/Output from previous day: 02/23 0701 - 02/24 0700 In: 2664.5 [I.V.:1813.2; NG/GT:600; IV Piggyback:251.3] Out: 387 [Urine:385; Stool:2] Intake/Output this shift: No intake/output data recorded.  General appearance: alert and no distress Neurologic: grossly intact Heart: regular rate and rhythm Lungs: clear ant-laterally Abdomen: soft, non-tender Extremities: PAS in place Wound: dressings intact, clean and dry  Lab Results: Recent Labs    06/04/18 0412 06/05/18 0426  WBC 6.8 10.3  HGB 9.7* 10.1*  HCT 28.7* 30.5*  PLT 186 197   BMET:  Recent Labs    06/04/18 0412 06/05/18 0545  NA 135 139  K 3.3* 4.0  CL 98 106  CO2 28 24  GLUCOSE 237* 122*  BUN 7 18  CREATININE 0.77 2.00*  CALCIUM 7.6* 7.2*    PT/INR: No results for input(s): LABPROT, INR in the last 72 hours. ABG    Component Value Date/Time   PHART 7.416 06/03/2018 1744   HCO3 30.5 (H) 06/03/2018 1744   TCO2 32 06/03/2018 1744   ACIDBASEDEF 3.0 (H)  05/30/2018 0400   O2SAT 99.0 06/03/2018 1744   CBG (last 3)  Recent Labs    06/04/18 2008 06/04/18 2316 06/05/18 0417  GLUCAP 130* 143* 104*    Meds Scheduled Meds: . chlorhexidine gluconate (MEDLINE KIT)  15 mL Mouth Rinse BID  . Chlorhexidine Gluconate Cloth  6 each Topical Daily  . feeding supplement (JEVITY 1.2 CAL)  1,000 mL Per Tube Q24H  . insulin aspart  0-24 Units Subcutaneous Q4H  . mouth rinse  15 mL Mouth Rinse 10 times per day  . metoprolol tartrate  100 mg Per Tube BID  . pantoprazole (PROTONIX) IV  40 mg Intravenous Q12H  . sodium chloride flush  10-40 mL Intracatheter Q12H   Continuous Infusions: . sodium chloride Stopped (06/03/18 0742)  . sodium chloride Stopped (06/04/18 1612)  . dexmedetomidine (PRECEDEX) IV infusion 1.2 mcg/kg/hr (06/05/18 0600)  . dextrose 5 % and 0.9% NaCl 50 mL/hr at 06/05/18 0600  . famotidine (PEPCID) IV Stopped (06/04/18 2241)  . fentaNYL infusion INTRAVENOUS 200 mcg/hr (06/05/18 0600)  . niCARDipine 3 mg/hr (06/05/18 0600)  . piperacillin-tazobactam (ZOSYN)  IV Stopped (06/05/18 0357)  . potassium chloride Stopped (06/01/18 0956)  . vancomycin 1,250 mg (06/05/18 0645)   PRN Meds:.sodium chloride, oxyCODONE **AND** acetaminophen, fentaNYL (SUBLIMAZE) injection, fentaNYL (SUBLIMAZE) injection, midazolam, midazolam, ondansetron (ZOFRAN) IV, potassium chloride, sodium chloride flush  Xrays Portable Chest  X-ray  Result Date: 06/04/2018 CLINICAL DATA:  Intubated EXAM: PORTABLE CHEST 1 VIEW COMPARISON:  Chest radiograph from earlier today. FINDINGS: Endotracheal tube tip is 2.0 cm above the carina. Enteric tube terminates in the proximal stomach with the side port at the esophagogastric junction. Right subclavian central venous catheter terminates over the right atrium. Pacer pads overlie the left chest. Stable cardiomediastinal silhouette with normal heart size. No pneumothorax. Small stable bilateral pleural effusions. Severe hazy and  linear opacities throughout the parahilar lungs, not appreciably changed. Bibasilar atelectasis. IMPRESSION: 1. Endotracheal tube tip 2.0 cm above the carina. 2. Enteric tube terminates in the proximal stomach with the side port at the esophagogastric junction, consider advancing 5 cm. 3. Stable severe hazy and linear opacities throughout the parahilar lungs, favor severe pulmonary edema. 4. Stable small bilateral pleural effusions and bibasilar atelectasis. Electronically Signed   By: Ilona Sorrel M.D.   On: 06/04/2018 17:50   Dg Chest Port 1 View  Result Date: 06/04/2018 CLINICAL DATA:  Status post esophagectomy 05/29/2018. Repositioning of NG tube. EXAM: PORTABLE CHEST 1 VIEW COMPARISON:  Single-view of the chest 06/03/2018 and 06/02/2018. FINDINGS: Support tubes and lines, including the patient's NG tube, are unchanged in position. Extensive bilateral airspace disease persists and has worsened on the right since yesterday's exam. There are bilateral pleural effusions. Heart size is normal. No pneumothorax. Aortic atherosclerosis noted. IMPRESSION: Support tubes and lines are unchanged in position. Extensive bilateral airspace disease persists. Aeration in the right chest is worsened. Electronically Signed   By: Inge Rise M.D.   On: 06/04/2018 11:10   Portable Chest X-ray  Result Date: 06/03/2018 CLINICAL DATA:  ET tube placement EXAM: PORTABLE CHEST 1 VIEW COMPARISON:  06/03/2018 FINDINGS: Endotracheal tube is 4 cm above the carina. Right PICC line has been placed with the tip in the SVC. NG tube tip is in the distal esophagus, stable. Heart is normal size. Bilateral interstitial and airspace opacities slightly improved since prior study. Moderate left effusion and small right effusion are stable. IMPRESSION: Endotracheal tube tip is 4 cm above the carina. Right PICC line tip in the SVC. NG tube tip remains in the distal esophagus. Diffuse bilateral interstitial and airspace opacities slightly  improved. Stable effusions. Electronically Signed   By: Rolm Baptise M.D.   On: 06/03/2018 17:03   Dg Abd Portable 1v  Result Date: 06/03/2018 CLINICAL DATA:  Shortness of breath. EXAM: PORTABLE ABDOMEN - 1 VIEW COMPARISON:  Chest x-ray earlier same day FINDINGS: Airspace disease noted right base with left pleural effusion evident. NG tube tip is positioned in the region of the esophagogastric junction. Jejunostomy tube overlies the left abdomen. No gaseous bil dilatation to suggest obstruction. IMPRESSION: 1. Airspace disease in the right base with left pleural effusion. 2. NG tube tip is at the esophagogastric junction. Jejunostomy tube overlies the left abdomen. 3. No evidence for bowel obstruction. Electronically Signed   By: Misty Stanley M.D.   On: 06/03/2018 14:17   [place] Ekg Site Rite  Result Date: 06/03/2018 If [facility] image not attached, placement could not be confirmed due to current cardiac rhythm.  Results for orders placed or performed during the hospital encounter of 05/29/18  Culture, blood (Routine X 2) w Reflex to ID Panel     Status: None (Preliminary result)   Collection Time: 06/03/18 11:55 AM  Result Value Ref Range Status   Specimen Description BLOOD RIGHT HAND  Final   Special Requests   Final  BOTTLES DRAWN AEROBIC ONLY Blood Culture adequate volume Performed at [facility], [place] [address], [place], [place] [address]    Culture NO GROWTH 1 DAY  Final   Report Status PENDING  Incomplete  Culture, blood (Routine X 2) w Reflex to ID Panel     Status: None (Preliminary result)   Collection Time: 06/03/18 11:59 AM  Result Value Ref Range Status   Specimen Description BLOOD LEFT ANTECUBITAL  Final   Special Requests   Final    BOTTLES DRAWN AEROBIC ONLY Blood Culture adequate volume Performed at [facility], [place] [address] [place], [place] [address]    Culture NO GROWTH 1 DAY  Final   Report Status PENDING  Incomplete  Culture, respiratory  (non-expectorated)     Status: None (Preliminary result)   Collection Time: 06/03/18  4:53 PM  Result Value Ref Range Status   Specimen Description BRONCHIAL ALVEOLAR LAVAGE  Final   Special Requests Normal  Final   Gram Stain   Final    FEW WBC PRESENT, PREDOMINANTLY PMN NO ORGANISMS SEEN    Culture   Final    NO GROWTH < 24 HOURS Performed at [facility], [place] [address] [place], [place] [address]    Report Status PENDING  Incomplete    Assessment/Plan: S/P Procedure(s) (LRB): TRANSHIATAL TOTAL ESOPHAGECTOMY (N/A) PYLOROMYOTOMY (N/A) FEEDING JEJUNOSTOMY (N/A) CERVICOESOPHAGO GASTROSTOMY (Right) Flexible Bronchoscopy LEFT CHEST TUBE INSERTION (Left)  1 Resp failure- management as per CCM, ABG- shows hypoxemia, severe edema/ARDS pattern on CXR- not weanable currrently 2  ? HCAP component- conts Zosyn/Vanco- no fevers or leukocytosis- cont coverage- cultures unremarkable so far 3 HTN at times- on nicardipine gtt, decrease metoprolol from 100 to 12.5 with significant bradycardia 4 cont tube feedings for now 5 poor UOP, Creat slow rise, defer to CCM management 6 BS control is good 7 anemia is improved and stable   LOS: 7 days    [name] PA-C pager [phone] 06/05/2018  Events of past 48 hours noted, prior patient walked around several times a day without difficult No evidence of vocal paralysis prior to intubation , with intact voice  Beta blocker  And nicardipine being held with bradycardia Cardiology to see , previously seen by Dr Irish Lack 05/04/2018 Continue vent support until see chest xray improving  No drainage from  Left neck drain I have seen and examined Aaron Wang and agree with the above assessment  and plan.  [name] MD Bee[facility] [phone] Office [phone] 06/05/2018 1:30 PM

## 2018-06-06 ENCOUNTER — Inpatient Hospital Stay (HOSPITAL_COMMUNITY): Payer: Medicaid Other

## 2018-06-06 DIAGNOSIS — J690 Pneumonitis due to inhalation of food and vomit: Secondary | ICD-10-CM

## 2018-06-06 DIAGNOSIS — J9601 Acute respiratory failure with hypoxia: Secondary | ICD-10-CM

## 2018-06-06 DIAGNOSIS — R0600 Dyspnea, unspecified: Secondary | ICD-10-CM

## 2018-06-06 LAB — POCT I-STAT 7, (LYTES, BLD GAS, ICA,H+H)
Acid-Base Excess: 1 mmol/L (ref 0.0–2.0)
Bicarbonate: 26.7 mmol/L (ref 20.0–28.0)
Calcium, Ion: 1.16 mmol/L (ref 1.15–1.40)
HCT: 30 % — ABNORMAL LOW (ref 39.0–52.0)
Hemoglobin: 10.2 g/dL — ABNORMAL LOW (ref 13.0–17.0)
O2 Saturation: 99 %
Patient temperature: 98.6
Potassium: 3.5 mmol/L (ref 3.5–5.1)
Sodium: 140 mmol/L (ref 135–145)
TCO2: 28 mmol/L (ref 22–32)
pCO2 arterial: 44.1 mmHg (ref 32.0–48.0)
pH, Arterial: 7.39 (ref 7.350–7.450)
pO2, Arterial: 120 mmHg — ABNORMAL HIGH (ref 83.0–108.0)

## 2018-06-06 LAB — CBC
HCT: 31.8 % — ABNORMAL LOW (ref 39.0–52.0)
Hemoglobin: 10.7 g/dL — ABNORMAL LOW (ref 13.0–17.0)
MCH: 30.9 pg (ref 26.0–34.0)
MCHC: 33.6 g/dL (ref 30.0–36.0)
MCV: 91.9 fL (ref 80.0–100.0)
Platelets: 239 10*3/uL (ref 150–400)
RBC: 3.46 MIL/uL — ABNORMAL LOW (ref 4.22–5.81)
RDW: 13.6 % (ref 11.5–15.5)
WBC: 14.2 10*3/uL — ABNORMAL HIGH (ref 4.0–10.5)
nRBC: 0 % (ref 0.0–0.2)

## 2018-06-06 LAB — BASIC METABOLIC PANEL
Anion gap: 12 (ref 5–15)
BUN: 29 mg/dL — ABNORMAL HIGH (ref 6–20)
CO2: 24 mmol/L (ref 22–32)
Calcium: 7.7 mg/dL — ABNORMAL LOW (ref 8.9–10.3)
Chloride: 104 mmol/L (ref 98–111)
Creatinine, Ser: 3.01 mg/dL — ABNORMAL HIGH (ref 0.61–1.24)
GFR calc Af Amer: 25 mL/min — ABNORMAL LOW (ref >60)
GFR calc non Af Amer: 21 mL/min — ABNORMAL LOW (ref >60)
Glucose, Bld: 161 mg/dL — ABNORMAL HIGH (ref 70–99)
Potassium: 5.1 mmol/L (ref 3.5–5.1)
Sodium: 140 mmol/L (ref 135–145)

## 2018-06-06 LAB — MAGNESIUM: Magnesium: 2.3 mg/dL (ref 1.7–2.4)

## 2018-06-06 LAB — VANCOMYCIN, TROUGH: Vancomycin Tr: 16 ug/mL (ref 15–20)

## 2018-06-06 LAB — PHOSPHORUS: Phosphorus: 4.5 mg/dL (ref 2.5–4.6)

## 2018-06-06 LAB — CULTURE, RESPIRATORY W GRAM STAIN
Culture: NO GROWTH
Special Requests: NORMAL

## 2018-06-06 LAB — GLUCOSE, CAPILLARY
Glucose-Capillary: 104 mg/dL — ABNORMAL HIGH (ref 70–99)
Glucose-Capillary: 118 mg/dL — ABNORMAL HIGH (ref 70–99)
Glucose-Capillary: 123 mg/dL — ABNORMAL HIGH (ref 70–99)
Glucose-Capillary: 127 mg/dL — ABNORMAL HIGH (ref 70–99)
Glucose-Capillary: 130 mg/dL — ABNORMAL HIGH (ref 70–99)
Glucose-Capillary: 134 mg/dL — ABNORMAL HIGH (ref 70–99)
Glucose-Capillary: 137 mg/dL — ABNORMAL HIGH (ref 70–99)

## 2018-06-06 LAB — CULTURE, RESPIRATORY

## 2018-06-06 MED ORDER — LABETALOL HCL 5 MG/ML IV SOLN
10.0000 mg | INTRAVENOUS | Status: DC | PRN
  Administered 2018-06-06 (×2): 10 mg via INTRAVENOUS
  Administered 2018-06-06: 20 mg via INTRAVENOUS
  Administered 2018-06-07: 10 mg via INTRAVENOUS
  Administered 2018-06-07 – 2018-06-08 (×3): 20 mg via INTRAVENOUS
  Administered 2018-06-08: 10 mg via INTRAVENOUS
  Administered 2018-06-08: 20 mg via INTRAVENOUS
  Administered 2018-06-08: 10 mg via INTRAVENOUS
  Administered 2018-06-09 – 2018-06-11 (×3): 20 mg via INTRAVENOUS
  Filled 2018-06-06 (×12): qty 4

## 2018-06-06 MED ORDER — FENTANYL CITRATE (PF) 100 MCG/2ML IJ SOLN
12.5000 ug | INTRAMUSCULAR | Status: DC | PRN
  Administered 2018-06-06 – 2018-06-07 (×4): 25 ug via INTRAVENOUS
  Filled 2018-06-06 (×4): qty 2

## 2018-06-06 MED ORDER — JEVITY 1.2 CAL PO LIQD
1000.0000 mL | ORAL | Status: DC
  Administered 2018-06-06 – 2018-06-08 (×3): 1000 mL
  Administered 2018-06-09: 10:00:00
  Filled 2018-06-06 (×4): qty 1000

## 2018-06-06 MED ORDER — VANCOMYCIN HCL IN DEXTROSE 1-5 GM/200ML-% IV SOLN
1000.0000 mg | Freq: Once | INTRAVENOUS | Status: DC
  Administered 2018-06-06: 1000 mg via INTRAVENOUS
  Filled 2018-06-06: qty 200

## 2018-06-06 MED ORDER — FAMOTIDINE IN NACL 20-0.9 MG/50ML-% IV SOLN
20.0000 mg | INTRAVENOUS | Status: DC
  Administered 2018-06-07 – 2018-06-11 (×5): 20 mg via INTRAVENOUS
  Filled 2018-06-06 (×6): qty 50

## 2018-06-06 NOTE — Progress Notes (Signed)
Per MD Gerhardt-ok for RN to pull NGT when we extubate. Awaiting extubation orders from CCM.

## 2018-06-06 NOTE — Progress Notes (Signed)
RT obtained pt ABG with the following results. No changes needed at this time. RT will continue to monitor.     Ref. Range 06/06/2018 05:56  Sample type Unknown ARTERIAL  pH, Arterial Latest Ref Range: 7.350 - 7.450  7.390  pCO2 arterial Latest Ref Range: 32.0 - 48.0 mmHg 44.1  pO2, Arterial Latest Ref Range: 83.0 - 108.0 mmHg 120.0 (H)  TCO2 Latest Ref Range: 22 - 32 mmol/L 28  Acid-Base Excess Latest Ref Range: 0.0 - 2.0 mmol/L 1.0  Bicarbonate Latest Ref Range: 20.0 - 28.0 mmol/L 26.7  O2 Saturation Latest Units: % 99.0  Patient temperature Unknown 98.6 F  Collection site Unknown RADIAL, ALLEN'S TEST ACCEPTABLE

## 2018-06-06 NOTE — Progress Notes (Signed)
Pt ao4, denies pain. Afebrile. Incision dressings changed, no output from neck drain. Extubated without issue @ 1130. Simple mask, Sp02 >95. Good productive cough. IS to 750. LSCTA, dim bases. ST vs NSR no ectopy, prn labetalol for tachycardia/htn. Dr gerhardt would like cardene off as it adds extra volume-would like total volume around 75/hr. TF @ 40, 30 q4g flush through j tube. Pt denies GI sx since NGT dc. +BM. Foley dc'd,+ void without issue. KVO fluids/abx as ordered. Up to chair with one assist, steady gait.

## 2018-06-06 NOTE — Progress Notes (Signed)
Pt extubaTed without issue. Up to chair with one assist, comfortable. Remains on simple mask for now. Sp02 mid to high 90's. LSCTA. Remains hypertensive with cardene. No bradycardic events today-was given 12.5mg  metoprolol this morning. Cardiology paged for alternative med regime. Order obtained for labetalol prn and will wean cardene.

## 2018-06-06 NOTE — Progress Notes (Signed)
Patient following commands and alert with rest periods through night.  Short of breath with exertion.  Short brady episode when getting off bed pan.   Patient some anxiety with breathing effort at times but follows all commands and not pulling or reaching for tube.  Cardene was restarted due to pressures in the 190's/.  Currently on 5.

## 2018-06-06 NOTE — Progress Notes (Addendum)
Nutrition Follow-up  DOCUMENTATION CODES:   Non-severe (moderate) malnutrition in context of chronic illness  INTERVENTION:  Recommend Jevity 1.2 at goal rate of 63ml/hr which provides 1872 kcal, 86.6 g protein, and 1259 ml free water   NUTRITION DIAGNOSIS:   Moderate Malnutrition related to chronic illness(esophageal cancer) as evidenced by mild fat depletion, mild muscle depletion, moderate fat depletion  Ongoing  GOAL:   Patient will meet greater than or equal to 90% of their needs  Progressing  MONITOR:   TF tolerance, Diet advancement, I & O's, Labs  ASSESSMENT:   63 year old male w/ PMH of HTN, GERD, and esophageal cancer. On 2/17 he underwent a thoracotomy, pyloroplasty, complete esophagectomy, and had a feeding jejunostomy placed.   2/22 Intubated d/t respiratory insufficiency, bronchoscopy performed and revealed pt aspirated on gastric contents. Green fluid was found in trachea running into each mainstem bronchus bilaterally 2/23 Extubated then re-intubated d/t inability to maintain oxygenation 2/23 CPR started after suffering asystolic arrest after laying flat for intubation 2/25 Extubated, currently on venturi mask  Spoke with patient who was sitting in his bedside chair. He seemed to be in good spirits considering the situation. Pt reported that he feels tired but better than he did the last few days. He had a BM today and is experiencing no n/v/ or diarrhea/constipation.  Spoke with RN who reported that TF has been running at 48ml since last night when Dr.Gehardt requested it be turned down due to risk of aspiration pneumonia. RN reported over night pt has been tolerating feeds well and pt agrees.  Spoke with physician about increasing feeds to goal rate. Per physician TF was increased to 23ml today. He reports that he will continue to increase TF to goal rate, but unable to increase anymore today.   Medications reviewed and include: IV dex 71ml/hr Labs  reviewed. I/O [phone]  Diet Order:   Diet Order            Diet NPO time specified  Diet effective now              EDUCATION NEEDS:   Education needs have been addressed  Skin:  Skin Assessment: Skin Integrity Issues: Skin Integrity Issues:: Incisions Incisions: abdomen, neck  Last BM:  2/20  Height:   Ht Readings from Last 1 Encounters:  05/29/18 5\' 7"  (1.702 m)    Weight:   Wt Readings from Last 1 Encounters:  06/05/18 59.8 kg    Ideal Body Weight:  67.27 kg  BMI:  Body mass index is 20.65 kg/m.  Estimated Nutritional Needs:   Kcal:  1800-2000 kcal  Protein:  85-95 g  Fluid:  >/= 1.8L    [employer] Dietetic Intern

## 2018-06-06 NOTE — Progress Notes (Addendum)
[facility] DAILY ICU PROGRESS NOTE                   [address]            [place] [address]          [phone]   8 Days Post-Op Procedure(s) (LRB): TRANSHIATAL TOTAL ESOPHAGECTOMY (N/A) PYLOROMYOTOMY (N/A) FEEDING JEJUNOSTOMY (N/A) CERVICOESOPHAGO GASTROSTOMY (Right) Flexible Bronchoscopy LEFT CHEST TUBE INSERTION (Left)  Total Length of Stay:  LOS: 8 days   Subjective: Appears more alert, responds appropriately to yes/no questions, follows commands  Objective: Vital signs in last 24 hours: Temp:  [97.7 F (36.5 C)-98.4 F (36.9 C)] 98.2 F (36.8 C) (02/24 1900) Pulse Rate:  [58-138] 103 (02/25 0700) Cardiac Rhythm: Sinus tachycardia (02/25 0343) Resp:  [13-23] 18 (02/25 0700) BP: (130-196)/(69-102) 156/82 (02/25 0700) SpO2:  [93 %-100 %] 100 % (02/25 0700) FiO2 (%):  [50 %-70 %] 50 % (02/25 0349)  Vent Mode: PRVC FiO2 (%):  [50 %-70 %] 50 % Set Rate:  [18 bmp] 18 bmp Vt Set:  [520 mL-530 mL] 520 mL PEEP:  [5 cmH20-10 cmH20] 10 cmH20 Plateau Pressure:  [11 cmH20-23 cmH20] 15 cmH20  Filed Weights   05/31/18 0500 06/01/18 0600 06/05/18 0515  Weight: 60.1 kg 60.7 kg 59.8 kg    Weight change:    Hemodynamic parameters for last 24 hours:    Intake/Output from previous day: 02/24 0701 - 02/25 0700 In: 2540.8 [I.V.:1285.9; NG/GT:650; IV Piggyback:484.9] Out: 760 [Urine:730; Emesis/NG output:30]  Intake/Output this shift: No intake/output data recorded.  Current Meds: Scheduled Meds: . chlorhexidine gluconate (MEDLINE KIT)  15 mL Mouth Rinse BID  . Chlorhexidine Gluconate Cloth  6 each Topical Daily  . feeding supplement (JEVITY 1.2 CAL)  1,000 mL Per Tube Q24H  . insulin aspart  0-24 Units Subcutaneous Q4H  . mouth rinse  15 mL Mouth Rinse 10 times per day  . metoprolol tartrate  12.5 mg Per Tube BID  . pantoprazole (PROTONIX) IV  40 mg Intravenous Q12H  . sodium chloride flush  10-40 mL Intracatheter Q12H  . vancomycin variable dose per  unstable renal function (pharmacist dosing)   Does not apply See admin instructions   Continuous Infusions: . sodium chloride Stopped (06/03/18 0742)  . sodium chloride Stopped (06/04/18 1612)  . dexmedetomidine (PRECEDEX) IV infusion Stopped (06/05/18 1225)  . dextrose 5 % and 0.9% NaCl 50 mL/hr at 06/05/18 1800  . famotidine (PEPCID) IV 20 mg (06/05/18 2036)  . fentaNYL infusion INTRAVENOUS 100 mcg/hr (06/06/18 6712)  . niCARDipine 5 mg/hr (06/06/18 0437)  . piperacillin-tazobactam (ZOSYN)  IV 3.375 g (06/06/18 0634)  . potassium chloride Stopped (06/01/18 0956)   PRN Meds:.sodium chloride, oxyCODONE **AND** acetaminophen, fentaNYL (SUBLIMAZE) injection, midazolam, midazolam, ondansetron (ZOFRAN) IV, potassium chloride, sodium chloride flush  General appearance: alert, cooperative and no distress Neurologic: intact Heart: regular rate and rhythm and tachy Lungs: coarse Abdomen: soft, non-tender Extremities: no edema or calf tenderness Wound: dressings intact  Lab Results: CBC: Recent Labs    06/05/18 0426  06/06/18 0324 06/06/18 0556  WBC 10.3  --  14.2*  --   HGB 10.1*   < > 10.7* 10.2*  HCT 30.5*   < > 31.8* 30.0*  PLT 197  --  239  --    < > = values in this interval not displayed.   BMET:  Recent Labs    06/05/18 0545  06/06/18 0324 06/06/18 0556  NA 139   < >  140 140  K 4.0   < > 5.1 3.5  CL 106  --  104  --   CO2 24  --  24  --   GLUCOSE 122*  --  161*  --   BUN 18  --  29*  --   CREATININE 2.00*  --  3.01*  --   CALCIUM 7.2*  --  7.7*  --    < > = values in this interval not displayed.    CMET: Lab Results  Component Value Date   WBC 14.2 (H) 06/06/2018   HGB 10.2 (L) 06/06/2018   HCT 30.0 (L) 06/06/2018   PLT 239 06/06/2018   GLUCOSE 161 (H) 06/06/2018   LDLDIRECT 112 (H) 11/26/2009   ALT 14 05/25/2018   AST 18 05/25/2018   NA 140 06/06/2018   K 3.5 06/06/2018   CL 104 06/06/2018   CREATININE 3.01 (H) 06/06/2018   BUN 29 (H) 06/06/2018    CO2 24 06/06/2018   TSH 0.925 11/26/2009   PSA 0.63 04/16/2010   INR 1.10 05/25/2018   MICROALBUR 0.50 01/01/2010   Results for orders placed or performed during the hospital encounter of 05/29/18  Culture, blood (Routine X 2) w Reflex to ID Panel     Status: None (Preliminary result)   Collection Time: 06/03/18 11:55 AM  Result Value Ref Range Status   Specimen Description BLOOD RIGHT HAND  Final   Special Requests   Final    BOTTLES DRAWN AEROBIC ONLY Blood Culture adequate volume   Culture   Final    NO GROWTH 2 DAYS Performed at [facility], [place] [address] [place], [place] [address]    Report Status PENDING  Incomplete  Culture, blood (Routine X 2) w Reflex to ID Panel     Status: None (Preliminary result)   Collection Time: 06/03/18 11:59 AM  Result Value Ref Range Status   Specimen Description BLOOD LEFT ANTECUBITAL  Final   Special Requests   Final    BOTTLES DRAWN AEROBIC ONLY Blood Culture adequate volume   Culture   Final    NO GROWTH 2 DAYS Performed at [facility], [place] [address], [place], [place] [address]    Report Status PENDING  Incomplete  Culture, respiratory (non-expectorated)     Status: None (Preliminary result)   Collection Time: 06/03/18  4:53 PM  Result Value Ref Range Status   Specimen Description BRONCHIAL ALVEOLAR LAVAGE  Final   Special Requests Normal  Final   Gram Stain   Final    FEW WBC PRESENT, PREDOMINANTLY PMN NO ORGANISMS SEEN    Culture   Final    NO GROWTH 2 DAYS Performed at [facility], [place] [address], [place], [place] [address]    Report Status PENDING  Incomplete     PT/INR: No results for input(s): LABPROT, INR in the last 72 hours. Radiology: No results found.   Assessment/Plan: S/P Procedure(s) (LRB): TRANSHIATAL TOTAL ESOPHAGECTOMY (N/A) PYLOROMYOTOMY (N/A) FEEDING JEJUNOSTOMY (N/A) CERVICOESOPHAGO GASTROSTOMY (Right) Flexible Bronchoscopy LEFT CHEST TUBE INSERTION (Left)  1 improved, CXR  ASD improved, ABG improved oxygenation with compensated resp acidosis. conts PRVC, plat pressure is good, wean as able per PCCM 2 hemodyn stable with some tachycardia- one episode of brady- cont to hold negative chronotropes. On Cardene for HTN 3 cont IV abx for now, no fevers but increased leukocytosis trend, neg cultures so far, presumed aspiration component 4 anemia is stable 5 creat has  cont to trend upwards, hopefully will plateau soon, I>O with poor UOP recorded- consider addition of diuretics 6 cont TF's 7 BS control is good  [name] PA-C pager [phone] 06/06/2018 7:23 AM   Acute Kidney Injury (any one)  Increase in SCr by > 0.3 within 48 hours  Increase SCr to > 1.5 times baseline  Urine volume < 0.5 ml/kg/h for 6 hrs  ?Stage 1 - Increase in serum creatinine to 1.5 to 1.9 times baseline, or increase in serum creatinine by ?0.3 mg/dL (?26.5 micromol/L), or reduction in urine output to <0.5 mL/kg per hour for 6 to 12 hours.  ?Stage 2 - Increase in serum creatinine to 2.0 to 2.9 times baseline, or reduction in urine output to <0.5 mL/kg per hour for ?12 hours.  ?Stage 3 - Increase in serum creatinine to 3.0 times baseline, or increase in serum creatinine to ?4.0 mg/dL (?353.6 micromol/L), or reduction in urine output to <0.3 mL/kg per hour for ?24 hours, or anuria for ?12 hours, or the initiation of renal replacement therapy, or, in patients <18 years, decrease in eGFR to <35 mL   Lab Results  Component Value Date   CREATININE 3.01 (H) 06/06/2018   Estimated Creatinine Clearance: 22.1 mL/min (A) (by C-G formula based on SCr of 3.01 mg/dL (H)).   Still intubated , but alert and neuro intact , close to extubation, chest xray improved  Back on cardene and low dose bata blocker  I have seen and examined Perlie Mayo and agree with the above assessment  and plan.  [name] MD Beeper [phone] Office [phone] 06/06/2018 11:03 AM

## 2018-06-06 NOTE — Progress Notes (Addendum)
Progress Note  Patient Name: Aaron Wang Date of Encounter: 06/06/2018  Primary Cardiologist: No primary care provider on file.   Subjective   Patient intubated. Doing well. Breathing improved. HR improved, now tachycardic.   Inpatient Medications    Scheduled Meds: . chlorhexidine gluconate (MEDLINE KIT)  15 mL Mouth Rinse BID  . Chlorhexidine Gluconate Cloth  6 each Topical Daily  . feeding supplement (JEVITY 1.2 CAL)  1,000 mL Per Tube Q24H  . insulin aspart  0-24 Units Subcutaneous Q4H  . mouth rinse  15 mL Mouth Rinse 10 times per day  . metoprolol tartrate  12.5 mg Per Tube BID  . pantoprazole (PROTONIX) IV  40 mg Intravenous Q12H  . sodium chloride flush  10-40 mL Intracatheter Q12H  . vancomycin variable dose per unstable renal function (pharmacist dosing)   Does not apply See admin instructions   Continuous Infusions: . sodium chloride Stopped (06/03/18 0742)  . sodium chloride Stopped (06/04/18 1612)  . dexmedetomidine (PRECEDEX) IV infusion Stopped (06/05/18 1225)  . dextrose 5 % and 0.9% NaCl 25 mL/hr at 06/06/18 0700  . famotidine (PEPCID) IV Stopped (06/05/18 2106)  . fentaNYL infusion INTRAVENOUS 100 mcg/hr (06/06/18 0700)  . niCARDipine 5 mg/hr (06/06/18 0700)  . piperacillin-tazobactam (ZOSYN)  IV 3.375 g (06/06/18 0634)  . potassium chloride Stopped (06/01/18 0956)   PRN Meds: sodium chloride, oxyCODONE **AND** acetaminophen, fentaNYL (SUBLIMAZE) injection, midazolam, midazolam, ondansetron (ZOFRAN) IV, potassium chloride, sodium chloride flush   Vital Signs    Vitals:   06/06/18 0500 06/06/18 0600 06/06/18 0700 06/06/18 0813  BP: (!) 172/97 (!) 148/82 (!) 156/82 (!) 143/78  Pulse: (!) 138 (!) 103 (!) 103 (!) 102  Resp: '17 18 18 19  '$ Temp:   98.1 F (36.7 C)   TempSrc:   Oral   SpO2: 100% 100% 100% 100%  Weight:      Height:        Intake/Output Summary (Last 24 hours) at 06/06/2018 0819 Last data filed at 06/06/2018 0700 Gross per 24 hour    Intake 2614.6 ml  Output 740 ml  Net 1874.6 ml   Filed Weights   05/31/18 0500 06/01/18 0600 06/05/18 0515  Weight: 60.1 kg 60.7 kg 59.8 kg   Telemetry    NSR - Personally Reviewed  ECG    No new EKG  Physical Exam   GEN: No acute distress.   Neck: No JVD Cardiac: Tachycardic but regular rhythm, no murmurs, rubs, or gallops.  Respiratory: Rhonchi bilateral  GI: Soft, nontender, non-distended  MS: No edema; No deformity. Neuro:  Nonfocal  Psych: Normal affect   Labs    Chemistry Recent Labs  Lab 06/04/18 0412 06/05/18 0545 06/05/18 0549 06/06/18 0324 06/06/18 0556  NA 135 139 139 140 140  K 3.3* 4.0 3.8 5.1 3.5  CL 98 106  --  104  --   CO2 28 24  --  24  --   GLUCOSE 237* 122*  --  161*  --   BUN 7 18  --  29*  --   CREATININE 0.77 2.00*  --  3.01*  --   CALCIUM 7.6* 7.2*  --  7.7*  --   GFRNONAA >60 35*  --  21*  --   GFRAA >60 41*  --  25*  --   ANIONGAP 9 9  --  12  --     Hematology Recent Labs  Lab 06/04/18 [phone] 06/05/18 0426 06/05/18 0549 06/06/18 0324 06/06/18  0556  WBC 6.8 10.3  --  14.2*  --   RBC 3.15* 3.28*  --  3.46*  --   HGB 9.7* 10.1* 10.9* 10.7* 10.2*  HCT 28.7* 30.5* 32.0* 31.8* 30.0*  MCV 91.1 93.0  --  91.9  --   MCH 30.8 30.8  --  30.9  --   MCHC 33.8 33.1  --  33.6  --   RDW 13.2 13.3  --  13.6  --   PLT 186 197  --  239  --    Cardiac Enzymes Recent Labs  Lab 06/03/18 1155  TROPONINI <0.03   No results for input(s): TROPIPOC in the last 168 hours.   BNPNo results for input(s): BNP, PROBNP in the last 168 hours.   DDimer No results for input(s): DDIMER in the last 168 hours.   Radiology    Dg Chest Port 1 View  Result Date: 06/05/2018 CLINICAL DATA:  Respiratory failure.  ETT. EXAM: PORTABLE CHEST 1 VIEW COMPARISON:  June 04, 2018 FINDINGS: The ETT terminates 3 cm above the carina, in good position. The side port of the NG tube is likely near the GE junction. The right PICC line terminates near the caval  atrial junction. No pneumothorax. Bilateral pleural effusions remain. Diffuse bilateral pulmonary infiltrates are similar in the interval. No other interval changes. IMPRESSION: 1. Bilateral pleural effusions with underlying opacities remain. 2. Diffuse bilateral pulmonary infiltrates, possibly edema. An infectious process is not completely excluded based on imaging. 3. Support apparatus as above. Electronically Signed   By: Dorise Bullion III M.D   On: 06/05/2018 08:29   Portable Chest X-ray  Result Date: 06/04/2018 CLINICAL DATA:  Intubated EXAM: PORTABLE CHEST 1 VIEW COMPARISON:  Chest radiograph from earlier today. FINDINGS: Endotracheal tube tip is 2.0 cm above the carina. Enteric tube terminates in the proximal stomach with the side port at the esophagogastric junction. Right subclavian central venous catheter terminates over the right atrium. Pacer pads overlie the left chest. Stable cardiomediastinal silhouette with normal heart size. No pneumothorax. Small stable bilateral pleural effusions. Severe hazy and linear opacities throughout the parahilar lungs, not appreciably changed. Bibasilar atelectasis. IMPRESSION: 1. Endotracheal tube tip 2.0 cm above the carina. 2. Enteric tube terminates in the proximal stomach with the side port at the esophagogastric junction, consider advancing 5 cm. 3. Stable severe hazy and linear opacities throughout the parahilar lungs, favor severe pulmonary edema. 4. Stable small bilateral pleural effusions and bibasilar atelectasis. Electronically Signed   By: Ilona Sorrel M.D.   On: 06/04/2018 17:50   Dg Chest Port 1 View  Result Date: 06/04/2018 CLINICAL DATA:  Status post esophagectomy 05/29/2018. Repositioning of NG tube. EXAM: PORTABLE CHEST 1 VIEW COMPARISON:  Single-view of the chest 06/03/2018 and 06/02/2018. FINDINGS: Support tubes and lines, including the patient's NG tube, are unchanged in position. Extensive bilateral airspace disease persists and has  worsened on the right since yesterday's exam. There are bilateral pleural effusions. Heart size is normal. No pneumothorax. Aortic atherosclerosis noted. IMPRESSION: Support tubes and lines are unchanged in position. Extensive bilateral airspace disease persists. Aeration in the right chest is worsened. Electronically Signed   By: Inge Rise M.D.   On: 06/04/2018 11:10   Cardiac Studies   TTE 06/03/2018 1. The left ventricle has normal systolic function with an ejection fraction of 60-65%. The cavity size was normal. There is mildly increased left ventricular wall thickness. Left ventricular diastolic Doppler parameters are consistent with impaired  relaxation No evidence  of left ventricular regional wall motion abnormalities. 2. The right ventricle has normal systolic function. The cavity was normal. There is no increase in right ventricular wall thickness. Estimated RVSP 36.4 mmHg. 3. The pericardial effusion is posterior to the left ventricle. 4. Trivial pericardial effusion is present. 5. The aortic valve is tricuspid. Mild calcification of the aortic valve.. Mild to moderate aortic annular calcification noted. 6. The mitral valve is normal in structure. 7. The tricuspid valve is normal in structure. Tricuspid valve regurgitation is mild-moderate. 8. The aortic root is normal in size and structure. 9. Moderate pleural effusion in the left lateral region.  Patient Profile   SANTONIO SPEAKMAN is a 63 y.o. male with a hx of HTN and esophageal cancer who is being seen for the evaluation of bradycardia.  Assessment & Plan    Sinus Bradycardia  - Now tachycardic. No further episodes of bradycardia  - Restarted on Metoprolol tartrate 12.5 mg BID  - Restarted on Diltiazem drip for HTN. Could consider stopping and switching to PO meds per tube.   - Off Dexmedetomidine - Still on Fentanyl drip   Will discuss the case further with Dr. Irish Lack.   For questions or updates, please  contact [place] Please consult [email or web address] for contact info under Cardiology/STEMI.   Signed, Ina Homes, MD  06/06/2018, 8:19 AM    I have examined the patient and reviewed assessment and plan and discussed with patient.  Agree with above as stated.  Use IV labetolol until he can swallow.  Also using IV Cardene for BP.  Bradycardia has resolved.  COntinue to monitor.   Larae Grooms

## 2018-06-06 NOTE — Progress Notes (Signed)
Pharmacy Antibiotic Note  Aaron Wang is a 63 y.o. male admitted on 05/29/2018 with pneumonia.  Pharmacy has been consulted for vancomycin and zosyn dosing.  Patient SCr has trended up significantly with a noticeable drop in urine output from 2 days ago. A vanc level this AM is therapeutic at 16.   2/22 BAL: pending 2/22 Bcx: ngtd  Plan: Zosyn 3.375g IV q8h (4 hour infusion).  Stop scheduled vancomycin and dose per levels. Vancomycin 1 gm x 1 dose today  F/u renal function, cultures and clinical course  Height: _0  (170.2 cm) Weight: 131 lb 13.4 oz (59.8 kg) IBW/kg (Calculated) : 66.1  Temp (24hrs), Avg:98.1 F (36.7 C), Min:97.7 F (36.5 C), Max:98.4 F (36.9 C)  Recent Labs  Lab 06/02/18 0317 06/03/18 0253 06/03/18 0534 06/03/18 1155 06/03/18 1424 06/04/18 0412 06/05/18 0426 06/05/18 0545 06/06/18 0324 06/06/18 0735  WBC 6.3  --  15.4*  --   --  6.8 10.3  --  14.2*  --   CREATININE 0.66 0.69  --   --   --  0.77  --  2.00* 3.01*  --   LATICACIDVEN  --   --   --  1.1 1.1  --   --   --   --   --   VANCOTROUGH  --   --   --   --   --   --   --   --   --  16    Estimated Creatinine Clearance: 22.1 mL/min (A) (by C-G formula based on SCr of 3.01 mg/dL (H)).    No Known Allergies  Thank you for allowing pharmacy to be a part of this patient's care.  Albertina Parr, PharmD., BCPS Clinical Pharmacist Clinical phone for 06/06/18 until 3:30pm: [phone] If after 3:30pm, please refer to [facility] for unit-specific pharmacist

## 2018-06-06 NOTE — Progress Notes (Signed)
Patient ID: Aaron Wang, male   DOB: 08-22-1955, 63 y.o.   MRN: [id number] EVENING ROUNDS NOTE :     [address]       [place] [address]             [phone]                 8 Days Post-Op Procedure(s) (LRB): TRANSHIATAL TOTAL ESOPHAGECTOMY (N/A) PYLOROMYOTOMY (N/A) FEEDING JEJUNOSTOMY (N/A) CERVICOESOPHAGO GASTROSTOMY (Right) Flexible Bronchoscopy LEFT CHEST TUBE INSERTION (Left)  Total Length of Stay:  LOS: 8 days  BP (!) 155/89   Pulse 90   Temp (!) 97.4 F (36.3 C) (Oral)   Resp (!) 22   Ht _0  (1.702 m)   Wt 59.8 kg   SpO2 92%   BMI 20.65 kg/m   .Intake/Output      02/24 0701 - 02/25 0700 02/25 0701 - 02/26 0700   I.V. (mL/kg) 1549.7 (25.9) 313.3 (5.2)   Other 120 120   NG/GT 750 337.3   IV Piggyback 590.4 310.8   Total Intake(mL/kg) 3010.1 (50.3) 1081.4 (18.1)   Urine (mL/kg/hr) 730 (0.5) 1050 (1.5)   Emesis/NG output 30 0   Drains  0   Stool 0 0   Total Output 760 1050   Net +2250.1 +31.4        Urine Occurrence  1 x   Stool Occurrence 1 x 2 x     . sodium chloride Stopped (06/04/18 1612)  . dextrose 5 % and 0.9% NaCl Stopped (06/06/18 1507)  . [START ON 06/07/2018] famotidine (PEPCID) IV    . niCARDipine Stopped (06/06/18 1323)  . piperacillin-tazobactam (ZOSYN)  IV 12.5 mL/hr at 06/06/18 1700  . potassium chloride Stopped (06/01/18 0956)     Lab Results  Component Value Date   WBC 14.2 (H) 06/06/2018   HGB 10.2 (L) 06/06/2018   HCT 30.0 (L) 06/06/2018   PLT 239 06/06/2018   GLUCOSE 161 (H) 06/06/2018   LDLDIRECT 112 (H) 11/26/2009   ALT 14 05/25/2018   AST 18 05/25/2018   NA 140 06/06/2018   K 3.5 06/06/2018   CL 104 06/06/2018   CREATININE 3.01 (H) 06/06/2018   BUN 29 (H) 06/06/2018   CO2 24 06/06/2018   TSH 0.925 11/26/2009   PSA 0.63 04/16/2010   INR 1.10 05/25/2018   MICROALBUR 0.50 01/01/2010   Tolerating extubation, ng tube out Up to chair   [name] MD  Beeper [phone] Office  [phone] 06/06/2018 6:58 PM

## 2018-06-06 NOTE — Progress Notes (Addendum)
2  NAME:  Aaron Wang, MRN:  [id number], DOB:  Jul 17, 1955, LOS: 8 ADMISSION DATE:  05/29/2018, CONSULTATION DATE:  06/01/2018 REFERRING MD:  Cyndia Bent, CHIEF COMPLAINT:  Dyspnea   Brief History   63 year old male status post esophagectomy 2/17 in the setting of squamous cell carcinoma.  Elective bronchoscopy, transhiatal total esophagectomy with cervical esophagogastrostomy, pyloromyotomy, feeding jejunostomy tube, placement of left chest tube.  Postoperatively he initially did well but over the course of 48 hours from February 20 through February 22nd he had hypertension, increasing shortness of breath. Intubated for respiratory distress.  Past Medical History  Gastroesophageal reflux disease, hypertension, squamous cell carcinoma of the esophagus, carpal tunnel syndrome  Significant Hospital Events   2/17 admission, esophagogastrogastrostomy  Consults:  PCCM   Procedures:  2/17 Esophagogastrogastrastostomy 2/22 PICC line 2/22 endotracheal tube 2/22 bronchoscopy: gastric secretions in trachea  Significant Diagnostic Tests:  June 03, 2018 echocardiogram LVEF 60 to 65%, mildly increased left ventricular wall thickness, impaired relaxation, RVSP 36, normal RV systolic function, pericardial effusion posterior to the left ventricle, mild to moderate tricuspid regurgitation, mild calcification of aortic valve, moderate left effusion  Micro Data:  BCx2 2/22 >>  BAL 2/22 >> negative  Antimicrobials:  Vanco 2/22 >>  Zosyn 2/22 >>   Interim history/subjective:  RT reports pt extubated to VM.  Pt up to chair / tolerated well.   Objective   Blood pressure (!) 163/74, pulse (!) 111, temperature 98.1 F (36.7 C), temperature source Oral, resp. rate (!) 24, height _0  (1.702 m), weight 59.8 kg, SpO2 95 %.    Vent Mode: PSV;CPAP FiO2 (%):  [40 %-55 %] 55 % Set Rate:  [18 bmp] 18 bmp Vt Set:  [520 mL-530 mL] 520 mL PEEP:  [5 cmH20-10 cmH20] 5 cmH20 Pressure Support:  [5 cmH20] 5  cmH20 Plateau Pressure:  [11 cmH20-23 cmH20] 23 cmH20   Intake/Output Summary (Last 24 hours) at 06/06/2018 1141 Last data filed at 06/06/2018 1100 Gross per 24 hour  Intake 2532.97 ml  Output 650 ml  Net 1882.97 ml   Filed Weights   05/31/18 0500 06/01/18 0600 06/05/18 0515  Weight: 60.1 kg 60.7 kg 59.8 kg   Examination: General: adult male   HEENT: MM pink/moist, VM in place Neuro: AAOx4, speech clear, MAE  CV: s1s2 rrr, no m/r/g PULM: even/non-labored, lungs bilaterally with occasional rhonchi  SF:KCLE, non-tender, bsx4 active  Extremities: warm/dry, no edema  Skin: no rashes or lesions  Resolved Hospital Problem list     Assessment & Plan:   Acute respiratory failure with hypoxemia -initial concern for possible aspiration pneumonitis vs HAP.  FOB concerning for aspiration.  P: Extubated this am  Mobilize / OOB to chair  Wean O2 for sats > 88-95% Discontinue vancomycin  Continue zosyn   Hypertension P: Wean cardene gtt as able  PT lopressor > has had issues with significant bradycardia  Defer BP regimen to Cardiology / EP   Bradycardia P: ICU monitoring   HCAP P: Follow CXR  Left pleural effusion P: Defer to TCTS   Esophageal cancer P: Post operative care per TCTS   Best practice:  Diet: NPO Pain/Anxiety/Delirium protocol (if indicated): PRN low dose fentanyl VAP protocol (if indicated):  DVT prophylaxis: SCD GI prophylaxis: Famotidine Glucose control: SSI Mobility: Bed rest Code Status: full Family Communication: Patient updated 2/25 at bedside  Disposition: Full  Labs   CBC: Recent Labs  Lab 06/02/18 0317 06/03/18 0534  06/04/18 0412 06/05/18 0426 06/05/18 0549 06/06/18  [id number] 06/06/18 0556  WBC 6.3 15.4*  --  6.8 10.3  --  14.2*  --   HGB 10.8* 13.6   < > 9.7* 10.1* 10.9* 10.7* 10.2*  HCT 32.9* 39.5   < > 28.7* 30.5* 32.0* 31.8* 30.0*  MCV 91.1 88.6  --  91.1 93.0  --  91.9  --   PLT 223 298  --  186 197  --  239  --    < > =  values in this interval not displayed.    Basic Metabolic Panel: Recent Labs  Lab 05/31/18 0440  06/02/18 0317 06/03/18 0253  06/04/18 0412 06/05/18 0545 06/05/18 0549 06/06/18 0324 06/06/18 0556  NA 136   < > 135 136   < > 135 139 139 140 140  K 3.5   < > 3.3* 4.4   < > 3.3* 4.0 3.8 5.1 3.5  CL 105   < > 103 96*  --  98 106  --  104  --   CO2 26   < > 25 28  --  28 24  --  24  --   GLUCOSE 129*   < > 85 124*  --  237* 122*  --  161*  --   BUN <5*   < > <5* <5*  --  7 18  --  29*  --   CREATININE 0.70   < > 0.66 0.69  --  0.77 2.00*  --  3.01*  --   CALCIUM 8.1*   < > 8.2* 8.9  --  7.6* 7.2*  --  7.7*  --   MG 1.8  --   --   --   --   --  1.7  --  2.3  --   PHOS  --   --   --   --   --   --  4.1  --  4.5  --    < > = values in this interval not displayed.   GFR: Estimated Creatinine Clearance: 22.1 mL/min (A) (by C-G formula based on SCr of 3.01 mg/dL (H)). Recent Labs  Lab 06/03/18 0534 06/03/18 1155 06/03/18 1424 06/04/18 0412 06/05/18 0426 06/06/18 0324  WBC 15.4*  --   --  6.8 10.3 14.2*  LATICACIDVEN  --  1.1 1.1  --   --   --     Liver Function Tests: No results for input(s): AST, ALT, ALKPHOS, BILITOT, PROT, ALBUMIN in the last 168 hours. No results for input(s): LIPASE, AMYLASE in the last 168 hours. No results for input(s): AMMONIA in the last 168 hours.  ABG    Component Value Date/Time   PHART 7.390 06/06/2018 0556   PCO2ART 44.1 06/06/2018 0556   PO2ART 120.0 (H) 06/06/2018 0556   HCO3 26.7 06/06/2018 0556   TCO2 28 06/06/2018 0556   ACIDBASEDEF 3.0 (H) 05/30/2018 0400   O2SAT 99.0 06/06/2018 0556     Coagulation Profile: No results for input(s): INR, PROTIME in the last 168 hours.  Cardiac Enzymes: Recent Labs  Lab 06/03/18 1155  TROPONINI <0.03    HbA1C: No results found for: HGBA1C  CBG: Recent Labs  Lab 06/05/18 1602 06/05/18 1951 06/06/18 0045 06/06/18 0334 06/06/18 0810  GLUCAP 168* 135* 127* 134* 123*    Noe Gens,  NP-C [facility] Pgr: [phone] or if no answer [phone] 06/06/2018, 11:41 AM  Attending Note:  63 year old male with esophageal cancer s/p esophagectomy with aspiration and  respiratory failure that required intubation.  Patient was extubated and had another episode of aspiration and was reintubated.  He is weaning very well this AM with clear lungs.  I reviewed CXR myself, ETT is in a good position.  I spoke with CVTS, feeling is that the patient should be extubated with the NGT out prior to decision on trach.  Will extubate today.  Remove OGT.  If patient decompensates or aspirates then will reintubate and discuss tracheostomy.  PCCM will continue to follow.  The patient is critically ill with multiple organ systems failure and requires high complexity decision making for assessment and support, frequent evaluation and titration of therapies, application of advanced monitoring technologies and extensive interpretation of multiple databases.   Critical Care Time devoted to patient care services described in this note is  33  Minutes. This time reflects time of care of this signee Dr [name]. This critical care time does not reflect procedure time, or teaching time or supervisory time of PA/NP/Med student/Med Resident etc but could involve care discussion time.  Rush Farmer, M.D. [facility] Pulmonary/Critical Care Medicine. Pager: [phone]. After hours pager: [phone].

## 2018-06-06 NOTE — Procedures (Signed)
Extubation Procedure Note  Patient Details:   Name: ZARYAN YAKUBOV DOB: 03-15-1956 MRN: [id number]   Airway Documentation:    Vent end date: 06/06/18 Vent end time: 1050   Evaluation  O2 sats: Desaturation of 80% immediately following extubation. Patient was placed on 100% NRB then transitioned to a 55% VM with SAT's of 92%. CCM NP was made aware. Complications: Complications of decreased SAT's. Patient did tolerate procedure well. Bilateral Breath Sounds: Clear   Yes   Patient was extubated per CCM orders. Patient is currently on a 55% VM with SAT's of 92%. Patient was instructed on IS x 5, highest goal reached was 1282mL.   Key Cen L 06/06/2018, 10:50 AM

## 2018-06-07 ENCOUNTER — Inpatient Hospital Stay (HOSPITAL_COMMUNITY): Payer: Medicaid Other

## 2018-06-07 LAB — POCT I-STAT 7, (LYTES, BLD GAS, ICA,H+H)
Acid-Base Excess: 2 mmol/L (ref 0.0–2.0)
Bicarbonate: 24.8 mmol/L (ref 20.0–28.0)
Calcium, Ion: 1.12 mmol/L — ABNORMAL LOW (ref 1.15–1.40)
HCT: 30 % — ABNORMAL LOW (ref 39.0–52.0)
Hemoglobin: 10.2 g/dL — ABNORMAL LOW (ref 13.0–17.0)
O2 Saturation: 98 %
Potassium: 3.5 mmol/L (ref 3.5–5.1)
Sodium: 143 mmol/L (ref 135–145)
TCO2: 26 mmol/L (ref 22–32)
pCO2 arterial: 33.5 mmHg (ref 32.0–48.0)
pH, Arterial: 7.479 — ABNORMAL HIGH (ref 7.350–7.450)
pO2, Arterial: 90 mmHg (ref 83.0–108.0)

## 2018-06-07 LAB — URINALYSIS, ROUTINE W REFLEX MICROSCOPIC
Bilirubin Urine: NEGATIVE
Glucose, UA: NEGATIVE mg/dL
Hgb urine dipstick: NEGATIVE
Ketones, ur: NEGATIVE mg/dL
Leukocytes,Ua: NEGATIVE
Nitrite: NEGATIVE
Protein, ur: NEGATIVE mg/dL
Specific Gravity, Urine: 1.005 (ref 1.005–1.030)
pH: 7 (ref 5.0–8.0)

## 2018-06-07 LAB — COMPREHENSIVE METABOLIC PANEL
ALT: 25 U/L (ref 0–44)
AST: 30 U/L (ref 15–41)
Albumin: 2 g/dL — ABNORMAL LOW (ref 3.5–5.0)
Alkaline Phosphatase: 64 U/L (ref 38–126)
Anion gap: 11 (ref 5–15)
BUN: 32 mg/dL — ABNORMAL HIGH (ref 6–20)
CO2: 24 mmol/L (ref 22–32)
Calcium: 7.6 mg/dL — ABNORMAL LOW (ref 8.9–10.3)
Chloride: 105 mmol/L (ref 98–111)
Creatinine, Ser: 3.15 mg/dL — ABNORMAL HIGH (ref 0.61–1.24)
GFR calc Af Amer: 24 mL/min — ABNORMAL LOW (ref >60)
GFR calc non Af Amer: 20 mL/min — ABNORMAL LOW (ref >60)
Glucose, Bld: 220 mg/dL — ABNORMAL HIGH (ref 70–99)
Potassium: 3.3 mmol/L — ABNORMAL LOW (ref 3.5–5.1)
Sodium: 140 mmol/L (ref 135–145)
Total Bilirubin: 0.9 mg/dL (ref 0.3–1.2)
Total Protein: 5.2 g/dL — ABNORMAL LOW (ref 6.5–8.1)

## 2018-06-07 LAB — BRAIN NATRIURETIC PEPTIDE: B Natriuretic Peptide: 436.2 pg/mL — ABNORMAL HIGH (ref 0.0–100.0)

## 2018-06-07 LAB — CBC
HCT: 30.7 % — ABNORMAL LOW (ref 39.0–52.0)
Hemoglobin: 10.2 g/dL — ABNORMAL LOW (ref 13.0–17.0)
MCH: 30.1 pg (ref 26.0–34.0)
MCHC: 33.2 g/dL (ref 30.0–36.0)
MCV: 90.6 fL (ref 80.0–100.0)
Platelets: 249 10*3/uL (ref 150–400)
RBC: 3.39 MIL/uL — ABNORMAL LOW (ref 4.22–5.81)
RDW: 13.7 % (ref 11.5–15.5)
WBC: 14.7 10*3/uL — ABNORMAL HIGH (ref 4.0–10.5)
nRBC: 0 % (ref 0.0–0.2)

## 2018-06-07 LAB — LACTIC ACID, PLASMA
Lactic Acid, Venous: 3.1 mmol/L (ref 0.5–1.9)
Lactic Acid, Venous: 1.5 mmol/L (ref 0.5–1.9)

## 2018-06-07 LAB — LIPASE, BLOOD: Lipase: 40 U/L (ref 11–51)

## 2018-06-07 LAB — GLUCOSE, CAPILLARY
Glucose-Capillary: 144 mg/dL — ABNORMAL HIGH (ref 70–99)
Glucose-Capillary: 123 mg/dL — ABNORMAL HIGH (ref 70–99)
Glucose-Capillary: 139 mg/dL — ABNORMAL HIGH (ref 70–99)
Glucose-Capillary: 134 mg/dL — ABNORMAL HIGH (ref 70–99)
Glucose-Capillary: 127 mg/dL — ABNORMAL HIGH (ref 70–99)

## 2018-06-07 LAB — VANCOMYCIN, RANDOM: Vancomycin Rm: 23

## 2018-06-07 LAB — AMYLASE: Amylase: 218 U/L — ABNORMAL HIGH (ref 28–100)

## 2018-06-07 MED ORDER — ACETAMINOPHEN 120 MG RE SUPP
600.0000 mg | Freq: Once | RECTAL | Status: DC
  Filled 2018-06-07: qty 5

## 2018-06-07 MED ORDER — ATROPINE SULFATE 1 MG/10ML IJ SOSY
PREFILLED_SYRINGE | INTRAMUSCULAR | Status: AC
  Filled 2018-06-07: qty 10

## 2018-06-07 MED ORDER — VANCOMYCIN HCL IN DEXTROSE 1-5 GM/200ML-% IV SOLN
1000.0000 mg | Freq: Once | INTRAVENOUS | Status: AC
  Administered 2018-06-07: 1000 mg via INTRAVENOUS
  Filled 2018-06-07: qty 200

## 2018-06-07 MED ORDER — VANCOMYCIN VARIABLE DOSE PER UNSTABLE RENAL FUNCTION (PHARMACIST DOSING): Status: DC

## 2018-06-07 MED ORDER — FUROSEMIDE 10 MG/ML IJ SOLN
80.0000 mg | Freq: Once | INTRAMUSCULAR | Status: AC
  Administered 2018-06-07: 80 mg via INTRAVENOUS
  Filled 2018-06-07: qty 8

## 2018-06-07 MED ORDER — VANCOMYCIN HCL 10 G IV SOLR
1250.0000 mg | Freq: Once | INTRAVENOUS | Status: DC
  Filled 2018-06-07: qty 1250

## 2018-06-07 MED ORDER — HYDRALAZINE HCL 20 MG/ML IJ SOLN
10.0000 mg | INTRAMUSCULAR | Status: DC | PRN
  Administered 2018-06-07 – 2018-06-08 (×4): 20 mg via INTRAVENOUS
  Administered 2018-06-09 – 2018-06-10 (×2): 40 mg via INTRAVENOUS
  Administered 2018-06-10: 10 mg via INTRAVENOUS
  Administered 2018-06-10 – 2018-06-13 (×3): 20 mg via INTRAVENOUS
  Administered 2018-06-13: 10 mg via INTRAVENOUS
  Administered 2018-06-14 – 2018-06-15 (×2): 20 mg via INTRAVENOUS
  Filled 2018-06-07 (×2): qty 1
  Filled 2018-06-07: qty 2
  Filled 2018-06-07 (×3): qty 1
  Filled 2018-06-07: qty 2
  Filled 2018-06-07: qty 1
  Filled 2018-06-07: qty 2
  Filled 2018-06-07 (×2): qty 1

## 2018-06-07 MED ORDER — ACETAMINOPHEN 10 MG/ML IV SOLN
1000.0000 mg | Freq: Once | INTRAVENOUS | Status: AC
  Administered 2018-06-07: 1000 mg via INTRAVENOUS
  Filled 2018-06-07: qty 100

## 2018-06-07 MED ORDER — ACETAMINOPHEN 650 MG RE SUPP
650.0000 mg | Freq: Once | RECTAL | Status: DC
  Filled 2018-06-07: qty 1

## 2018-06-07 MED ORDER — FUROSEMIDE 10 MG/ML IJ SOLN
40.0000 mg | Freq: Once | INTRAMUSCULAR | Status: AC
  Administered 2018-06-07: 40 mg via INTRAVENOUS
  Filled 2018-06-07: qty 4

## 2018-06-07 MED ORDER — FOLIC ACID 5 MG/ML IJ SOLN
1.0000 mg | Freq: Every day | INTRAMUSCULAR | Status: DC
  Administered 2018-06-07 – 2018-06-14 (×8): 1 mg via INTRAVENOUS
  Filled 2018-06-07 (×8): qty 0.2

## 2018-06-07 MED ORDER — ACETAMINOPHEN 650 MG RE SUPP: 650.0000 mg | Freq: Once | RECTAL | Status: DC

## 2018-06-07 MED ORDER — LORAZEPAM 2 MG/ML IJ SOLN
0.5000 mg | Freq: Once | INTRAMUSCULAR | Status: AC
  Administered 2018-06-07: 0.5 mg via INTRAVENOUS
  Filled 2018-06-07: qty 1

## 2018-06-07 MED ORDER — LORAZEPAM 2 MG/ML IJ SOLN: 2.0000 mg | INTRAMUSCULAR | Status: DC | PRN

## 2018-06-07 MED ORDER — THIAMINE HCL 100 MG/ML IJ SOLN
100.0000 mg | Freq: Every day | INTRAMUSCULAR | Status: DC
  Administered 2018-06-07 – 2018-06-14 (×8): 100 mg via INTRAVENOUS
  Filled 2018-06-07 (×8): qty 2

## 2018-06-07 NOTE — Progress Notes (Addendum)
Progress Note  Patient Name: CASSIAN TORELLI Date of Encounter: 06/07/2018  Primary Cardiologist: No primary care provider on file.   Subjective   Patient extubated on 06/06/2018. Developed respiratory distress and placed on Bipap. He is anxious this AM stating he can breathing.   Inpatient Medications    Scheduled Meds: . atropine      . chlorhexidine gluconate (MEDLINE KIT)  15 mL Mouth Rinse BID  . Chlorhexidine Gluconate Cloth  6 each Topical Daily  . feeding supplement (JEVITY 1.2 CAL)  1,000 mL Per Tube Q24H  . insulin aspart  0-24 Units Subcutaneous Q4H  . LORazepam  0.5 mg Intravenous Once  . metoprolol tartrate  12.5 mg Per Tube BID  . pantoprazole (PROTONIX) IV  40 mg Intravenous Q12H  . sodium chloride flush  10-40 mL Intracatheter Q12H   Continuous Infusions: . sodium chloride Stopped (06/04/18 1612)  . acetaminophen 1,000 mg (06/07/18 0825)  . dextrose 5 % and 0.9% NaCl Stopped (06/06/18 1507)  . famotidine (PEPCID) IV    . niCARDipine 5 mg/hr (06/07/18 0727)  . piperacillin-tazobactam (ZOSYN)  IV 3.375 g (06/07/18 0629)  . potassium chloride Stopped (06/01/18 0956)   PRN Meds: sodium chloride, oxyCODONE **AND** acetaminophen, fentaNYL (SUBLIMAZE) injection, hydrALAZINE, labetalol, ondansetron (ZOFRAN) IV, potassium chloride, sodium chloride flush   Vital Signs    Vitals:   06/07/18 0500 06/07/18 0600 06/07/18 0732 06/07/18 0810  BP: (!) 169/93 (!) 184/98 (!) 182/95 (!) 171/85  Pulse: (!) 102 (!) 101 (!) 120 (!) 136  Resp: 20 19 (!) 25 (!) 21  Temp:      TempSrc:      SpO2: 100% 100% 99% 95%  Weight:      Height:        Intake/Output Summary (Last 24 hours) at 06/07/2018 0827 Last data filed at 06/07/2018 0630 Gross per 24 hour  Intake 1322.62 ml  Output 2075 ml  Net -752.38 ml   Filed Weights   05/31/18 0500 06/01/18 0600 06/05/18 0515  Weight: 60.1 kg 60.7 kg 59.8 kg   Telemetry    NSR - Personally Reviewed  ECG    No new EKG  Physical  Exam   GEN: No acute distress.   Neck: No JVD Cardiac: Tachycardic but regular rhythm, no murmurs, rubs, or gallops.  Respiratory: Rhonchi bilateral  GI: Soft, nontender, non-distended  MS: No edema; No deformity. Neuro:  Nonfocal  Psych: Normal affect   Labs    Chemistry Recent Labs  Lab 06/05/18 0545  06/06/18 0324 06/06/18 0556 06/07/18 0326 06/07/18 0733  NA 139   < > 140 140 140 143  K 4.0   < > 5.1 3.5 3.3* 3.5  CL 106  --  104  --  105  --   CO2 24  --  24  --  24  --   GLUCOSE 122*  --  161*  --  220*  --   BUN 18  --  29*  --  32*  --   CREATININE 2.00*  --  3.01*  --  3.15*  --   CALCIUM 7.2*  --  7.7*  --  7.6*  --   PROT  --   --   --   --  5.2*  --   ALBUMIN  --   --   --   --  2.0*  --   AST  --   --   --   --  30  --  ALT  --   --   --   --  25  --   ALKPHOS  --   --   --   --  64  --   BILITOT  --   --   --   --  0.9  --   GFRNONAA 35*  --  21*  --  20*  --   GFRAA 41*  --  25*  --  24*  --   ANIONGAP 9  --  12  --  11  --    < > = values in this interval not displayed.    Hematology Recent Labs  Lab 06/05/18 0426  06/06/18 0324 06/06/18 0556 06/07/18 0326 06/07/18 0733  WBC 10.3  --  14.2*  --  14.7*  --   RBC 3.28*  --  3.46*  --  3.39*  --   HGB 10.1*   < > 10.7* 10.2* 10.2* 10.2*  HCT 30.5*   < > 31.8* 30.0* 30.7* 30.0*  MCV 93.0  --  91.9  --  90.6  --   MCH 30.8  --  30.9  --  30.1  --   MCHC 33.1  --  33.6  --  33.2  --   RDW 13.3  --  13.6  --  13.7  --   PLT 197  --  239  --  249  --    < > = values in this interval not displayed.   Cardiac Enzymes Recent Labs  Lab 06/03/18 1155  TROPONINI <0.03   No results for input(s): TROPIPOC in the last 168 hours.   BNP Recent Labs  Lab 06/07/18 0326  BNP 436.2*    DDimer No results for input(s): DDIMER in the last 168 hours.   Radiology    Dg Chest Port 1 View  Result Date: 06/07/2018 CLINICAL DATA:  Respiratory distress. Shortness of breath tonight. EXAM: PORTABLE CHEST 1  VIEW COMPARISON:  06/06/2018 FINDINGS: Cardiac enlargement. Diffuse interstitial infiltrates throughout the lungs likely representing interstitial edema. Small bilateral pleural effusions. No pneumothorax. Right PICC line with tip over the low SVC region. Calcification of the aorta. Mild progression since previous study. Endotracheal and enteric tubes have been removed in the interval. IMPRESSION: Cardiac enlargement with diffuse interstitial edema and small bilateral pleural effusions. Mild progression since previous study. Electronically Signed   By: Lucienne Capers M.D.   On: 06/07/2018 03:20   Dg Chest Port 1 View  Result Date: 06/06/2018 CLINICAL DATA:  Endotracheal tube present, respiratory failure, stoppage ectomy. EXAM: PORTABLE CHEST 1 VIEW COMPARISON:  Chest x-rays dated 06/05/2018 and 06/04/2018. FINDINGS: Endotracheal tube is well positioned with tip approximately 4 cm above the carina. Presumed enteric tube passes below the diaphragm. RIGHT sided PICC line appears well positioned with tip at the level of the cavoatrial junction. Heart size and mediastinal contours appear stable. There is central pulmonary vascular congestion and mild bilateral interstitial edema, but this is significantly improved compared to the recent exams. There are persistent bibasilar opacities, likely a combination of atelectasis and small pleural effusions. No pneumothorax seen. IMPRESSION: 1. Support apparatus appears appropriately positioned. 2. Central pulmonary vascular congestion and mild bilateral interstitial edema persists, but significantly improved compared to the recent exams indicating improved fluid status. 3. Persistent bibasilar opacities, likely a combination of atelectasis and small pleural effusions. Electronically Signed   By: Franki Cabot M.D.   On: 06/06/2018 09:12   Cardiac Studies   TTE 06/03/2018  1. The left ventricle has normal systolic function with an ejection fraction of 60-65%. The cavity  size was normal. There is mildly increased left ventricular wall thickness. Left ventricular diastolic Doppler parameters are consistent with impaired  relaxation No evidence of left ventricular regional wall motion abnormalities. 2. The right ventricle has normal systolic function. The cavity was normal. There is no increase in right ventricular wall thickness. Estimated RVSP 36.4 mmHg. 3. The pericardial effusion is posterior to the left ventricle. 4. Trivial pericardial effusion is present. 5. The aortic valve is tricuspid. Mild calcification of the aortic valve.. Mild to moderate aortic annular calcification noted. 6. The mitral valve is normal in structure. 7. The tricuspid valve is normal in structure. Tricuspid valve regurgitation is mild-moderate. 8. The aortic root is normal in size and structure. 9. Moderate pleural effusion in the left lateral region.  Patient Profile   MILLIE SHORB is a 63 y.o. male with a hx of HTN and esophageal cancer who is being seen for the evaluation of bradycardia.  Assessment & Plan    Sinus Bradycardia / Tachycardia - Episode of bradycardia and asystole this AM around 7AM. QRS remains narrow.  - CXR with increased interstitial pattern and enlarging bilateral pleural effusions. BNP >400. Given furosemide 80 mg followed by 40 mg IV - On Metoprolol tartrate 12.5 mg BID and Diltiazem drip for HTN.  - On PRN labetalol and hydralazine  - One option to treat the patient's blood pressure without affecting the HR would be to consider starting a clonidine patch (clondine has also been used off label to treat anxiety)  Will discuss the case further with Dr. Irish Lack.   For questions or updates, please contact [place] Please consult [email or web address] for contact info under Cardiology/STEMI.   Signed, Ina Homes, MD  06/07/2018, 8:27 AM    I have examined the patient and reviewed assessment and plan and discussed with patient.  Agree with  above as stated.   Had an episode of severe sinus brady/sinus pause.  Recovered spontaneously.    Stop metoprolol. Titrate Cardene for BP control to minimize effect on heart rate.  OK to continue Labetolol. Hydralazine may cause some tachycardia, so cardene is preferable.   Larae Grooms

## 2018-06-07 NOTE — Progress Notes (Signed)
[facility] Progress Note Patient Name: Aaron Wang DOB: [birth year] MRN: [id number]   Date of Service  06/07/2018  HPI/Events of Note  resp distress, hypoxia worse, on NRB, extubated earlier today  eICU Interventions  biPAP for WOB fent 25 x 1 CXR stat Cr rising- but can try lasix     Intervention Category Major Interventions: Respiratory failure - evaluation and management  Rakesh V. Alva 06/07/2018, 3:11 AM

## 2018-06-07 NOTE — Progress Notes (Signed)
Patient ID: Aaron Wang, male   DOB: 04-01-1956, 63 y.o.   MRN: [id number] TCTS DAILY ICU PROGRESS NOTE                   [address]            [place] [address]          [phone]   9 Days Post-Op Procedure(s) (LRB): TRANSHIATAL TOTAL ESOPHAGECTOMY (N/A) PYLOROMYOTOMY (N/A) FEEDING JEJUNOSTOMY (N/A) CERVICOESOPHAGO GASTROSTOMY (Right) Flexible Bronchoscopy LEFT CHEST TUBE INSERTION (Left)  Total Length of Stay:  LOS: 9 days   Subjective: Patient seen this morning, noted during the night he had episodes of bradycardia, was put back on BiPAP, this morning was having chills, no fever   Objective: Vital signs in last 24 hours: Temp:  [97.4 F (36.3 C)-98.4 F (36.9 C)] 98.2 F (36.8 C) (02/26 0400) Pulse Rate:  [75-128] 120 (02/26 0732) Cardiac Rhythm: Normal sinus rhythm (02/26 0400) Resp:  [13-29] 25 (02/26 0732) BP: (116-192)/(65-110) 182/95 (02/26 0732) SpO2:  [90 %-100 %] 99 % (02/26 0732) FiO2 (%):  [40 %-60 %] 60 % (02/26 0732)  Filed Weights   05/31/18 0500 06/01/18 0600 06/05/18 0515  Weight: 60.1 kg 60.7 kg 59.8 kg    Weight change:    Hemodynamic parameters for last 24 hours:    Intake/Output from previous day: 02/25 0701 - 02/26 0700 In: 1420 [I.V.:352; NG/GT:637.3; IV Piggyback:310.8] Out: 2075 [Urine:2075]  Intake/Output this shift: No intake/output data recorded.  Current Meds: Scheduled Meds: . acetaminophen  650 mg Rectal Once  . acetaminophen  650 mg Rectal Once  . atropine      . chlorhexidine gluconate (MEDLINE KIT)  15 mL Mouth Rinse BID  . Chlorhexidine Gluconate Cloth  6 each Topical Daily  . feeding supplement (JEVITY 1.2 CAL)  1,000 mL Per Tube Q24H  . insulin aspart  0-24 Units Subcutaneous Q4H  . metoprolol tartrate  12.5 mg Per Tube BID  . pantoprazole (PROTONIX) IV  40 mg Intravenous Q12H  . sodium chloride flush  10-40 mL Intracatheter Q12H   Continuous Infusions: . sodium chloride Stopped (06/04/18  1612)  . dextrose 5 % and 0.9% NaCl Stopped (06/06/18 1507)  . famotidine (PEPCID) IV    . niCARDipine 5 mg/hr (06/07/18 0727)  . piperacillin-tazobactam (ZOSYN)  IV 3.375 g (06/07/18 0629)  . potassium chloride Stopped (06/01/18 0956)   PRN Meds:.sodium chloride, oxyCODONE **AND** acetaminophen, fentaNYL (SUBLIMAZE) injection, hydrALAZINE, labetalol, ondansetron (ZOFRAN) IV, potassium chloride, sodium chloride flush  General appearance: alert and moderate distress Neurologic: intact Heart: regular rate and rhythm, S1, S2 normal, no murmur, click, rub or gallop and With occasional bradycardic episode Lungs: diminished breath sounds bibasilar Abdomen: soft, non-tender; bowel sounds normal; no masses,  no organomegaly Extremities: extremities normal, atraumatic, no cyanosis or edema Wound: Neck incision with drain in place no drainage, abdominal incisions intact does not appear infected Patient had bowel movement last night, had been tolerating jejunostomy tube feedings   Lab Results: CBC: Recent Labs    06/06/18 0324  06/07/18 0326 06/07/18 0733  WBC 14.2*  --  14.7*  --   HGB 10.7*   < > 10.2* 10.2*  HCT 31.8*   < > 30.7* 30.0*  PLT 239  --  249  --    < > = values in this interval not displayed.   BMET:  Recent Labs    06/06/18 0324  06/07/18 0326 06/07/18 0733  NA 140   < >  140 143  K 5.1   < > 3.3* 3.5  CL 104  --  105  --   CO2 24  --  24  --   GLUCOSE 161*  --  220*  --   BUN 29*  --  32*  --   CREATININE 3.01*  --  3.15*  --   CALCIUM 7.7*  --  7.6*  --    < > = values in this interval not displayed.    CMET: Lab Results  Component Value Date   WBC 14.7 (H) 06/07/2018   HGB 10.2 (L) 06/07/2018   HCT 30.0 (L) 06/07/2018   PLT 249 06/07/2018   GLUCOSE 220 (H) 06/07/2018   LDLDIRECT 112 (H) 11/26/2009   ALT 25 06/07/2018   AST 30 06/07/2018   NA 143 06/07/2018   K 3.5 06/07/2018   CL 105 06/07/2018   CREATININE 3.15 (H) 06/07/2018   BUN 32 (H)  06/07/2018   CO2 24 06/07/2018   TSH 0.925 11/26/2009   PSA 0.63 04/16/2010   INR 1.10 05/25/2018   MICROALBUR 0.50 01/01/2010      PT/INR: No results for input(s): LABPROT, INR in the last 72 hours. Radiology: Dg Chest Port 1 View  Result Date: 06/07/2018 CLINICAL DATA:  Respiratory distress. Shortness of breath tonight. EXAM: PORTABLE CHEST 1 VIEW COMPARISON:  06/06/2018 FINDINGS: Cardiac enlargement. Diffuse interstitial infiltrates throughout the lungs likely representing interstitial edema. Small bilateral pleural effusions. No pneumothorax. Right PICC line with tip over the low SVC region. Calcification of the aorta. Mild progression since previous study. Endotracheal and enteric tubes have been removed in the interval. IMPRESSION: Cardiac enlargement with diffuse interstitial edema and small bilateral pleural effusions. Mild progression since previous study. Electronically Signed   By: Lucienne Capers M.D.   On: 06/07/2018 03:20     Assessment/Plan: S/P Procedure(s) (LRB): TRANSHIATAL TOTAL ESOPHAGECTOMY (N/A) PYLOROMYOTOMY (N/A) FEEDING JEJUNOSTOMY (N/A) CERVICOESOPHAGO GASTROSTOMY (Right) Flexible Bronchoscopy LEFT CHEST TUBE INSERTION (Left) Lactate level sent this morning, concerned about sepsis patient currently on antibiotics including vancomycin with dose adjusted for renal function, blood cultures and urine culture ordered Chest x-ray is basically unchanged from yesterday with fine diffuse interstitial edema Creatinine remains at 3 On BiPAP blood gases show adequate oxygenation no evidence of metabolic acidosis Will obtain noncontrasted CT of chest and abdomen when patient can tolerate trip to x-ray CCM contacted to continue critical care services Liver functions normal, will check lipase and amylase    Aaron Wang 06/07/2018 7:47 AM

## 2018-06-07 NOTE — Progress Notes (Signed)
RT called to bedside for pt in respiratory distress. Pt currently wearing NRB 15lpm with a Sat of 94% and RR 31. Pt was placed on servo in NIV mode with settings of 12/6 with FIO2 of 40% per MD Elsworth Soho. Pt very anxious and has increased WOB. Rt adjusted mask for pt comfort with minimal leak. RT will continue to monitor.

## 2018-06-07 NOTE — Progress Notes (Signed)
Pharmacy Antibiotic Note  Aaron Wang is a 63 y.o. male admitted on 05/29/2018 with pneumonia.  Pharmacy has been consulted to restart vancomycin today. Remains on Zosyn. D#5 of abx for asp vs. HCAP; new resp distress with increasing lactate, WBC 14.2 > 14.7, LA 1.1 > 3.1   A 22 hr Vanc random today is 23.    Vanc (used Scr 1) 2/22 >> 2/25; 2/26>>  Zosyn 2/22>>  2/22 BAL: pending 2/22 Bcx: ngtd 2/26 BCx >>  2/26 UCx >>   Plan: Zosyn 3.375g IV q8h (4 hour infusion).  Vancomycin 1 gm IV once at 11 PM tonight. Continue to dose based on levels given poor vancomycin clearance  F/u renal function, cultures and clinical course  Height: _0  (170.2 cm) Weight: 131 lb 13.4 oz (59.8 kg) IBW/kg (Calculated) : 66.1  Temp (24hrs), Avg:98.1 F (36.7 C), Min:97.4 F (36.3 C), Max:98.4 F (36.9 C)  Recent Labs  Lab 06/03/18 0253 06/03/18 0534 06/03/18 1155 06/03/18 1424 06/04/18 0412 06/05/18 0426 06/05/18 0545 06/06/18 0324 06/06/18 0735 06/07/18 0326 06/07/18 0730  WBC  --  15.4*  --   --  6.8 10.3  --  14.2*  --  14.7*  --   CREATININE 0.69  --   --   --  0.77  --  2.00* 3.01*  --  3.15*  --   LATICACIDVEN  --   --  1.1 1.1  --   --   --   --   --   --  3.1*  VANCOTROUGH  --   --   --   --   --   --   --   --  16  --   --     Estimated Creatinine Clearance: 21.1 mL/min (A) (by C-G formula based on SCr of 3.15 mg/dL (H)).    No Known Allergies  Thank you for allowing pharmacy to be a part of this patient's care.  Albertina Parr, PharmD., BCPS Clinical Pharmacist Clinical phone for 06/07/18 until 3:30pm: [phone] If after 3:30pm, please refer to [facility] for unit-specific pharmacist

## 2018-06-07 NOTE — Progress Notes (Addendum)
2  NAME:  Aaron Wang, MRN:  [id number], DOB:  June 09, 1955, LOS: 9 ADMISSION DATE:  05/29/2018, CONSULTATION DATE:  06/01/2018 REFERRING MD:  Cyndia Bent, CHIEF COMPLAINT:  Dyspnea   Brief History   63 year old male status post esophagectomy 2/17 in the setting of squamous cell carcinoma.  Elective bronchoscopy, transhiatal total esophagectomy with cervical esophagogastrostomy, pyloromyotomy, feeding jejunostomy tube, placement of left chest tube.  Postoperatively he initially did well but over the course of 48 hours from February 20 through February 22nd he had hypertension, increasing shortness of breath. Intubated for respiratory distress.  Past Medical History  Gastroesophageal reflux disease, hypertension, squamous cell carcinoma of the esophagus, carpal tunnel syndrome  Significant Hospital Events   2/17 admission, esophagogastrogastrostomy 2/25- BiPap at 300 am. 2x bradycardia with brief asystole, associated with bowel movement. Spontaneous ROSC. Transitioned to HFNC  Consults:  PCCM    Procedures:  2/17 Esophagogastrogastrastostomy 2/22 PICC line 2/22 endotracheal tube 2/22 bronchoscopy: gastric secretions in trachea  Significant Diagnostic Tests:  June 03, 2018 echocardiogram LVEF 60 to 65%, mildly increased left ventricular wall thickness, impaired relaxation, RVSP 36, normal RV systolic function, pericardial effusion posterior to the left ventricle, mild to moderate tricuspid regurgitation, mild calcification of aortic valve, moderate left effusion  Micro Data:  BCx2 2/22 >> NGTD BAL 2/22 >> negative BCx 2/26>>> UA 2/26>>>  Antimicrobials:  Vanco 2/22 >>  Zosyn 2/22 >>   Interim history/subjective:  Overnight started on BiPAP for increased WOB, hypoxia. CXR with pulm edema received lasix  This morning, 2x bradycardia with brief asystole, spontaneous ROSC associated with bowel movement  Transitioned to HFNC this morning, lactic acid, amylase, lipase, BCx pending.    Objective   Blood pressure (!) 182/95, pulse (!) 120, temperature 98.2 F (36.8 C), temperature source Oral, resp. rate (!) 25, height _0  (1.702 m), weight 59.8 kg, SpO2 99 %.    Vent Mode: BIPAP FiO2 (%):  [40 %-60 %] 60 % Set Rate:  [18 bmp] 18 bmp Vt Set:  [520 mL] 520 mL PEEP:  [5 cmH20-10 cmH20] 6 cmH20 Pressure Support:  [5 cmH20-6 cmH20] 6 cmH20 Plateau Pressure:  [23 cmH20] 23 cmH20   Intake/Output Summary (Last 24 hours) at 06/07/2018 0751 Last data filed at 06/07/2018 0630 Gross per 24 hour  Intake 1420.04 ml  Output 2075 ml  Net -654.96 ml   Filed Weights   05/31/18 0500 06/01/18 0600 06/05/18 0515  Weight: 60.1 kg 60.7 kg 59.8 kg   Examination: General: frail appearing older adult male, NAD  HEENT: NCAT, pink and moist mucus membranes. Esophagectomy dressing clean dry intact Neuro: AAOx4. Following commands. BUE LUE tremor  CV: tachycardic. s1s2 no r/g/m. 2+ radial pulses  PULM: CTA bilaterally, no accessory muscle recruitment.  GI: round, distended, tender to palpation. Bowel sounds x4  Extremities: symmetrical bulk and tone Skin: clean, dry, warm, intact  Resolved Hospital Problem list     Assessment & Plan:    Acute respiratory failure with hypoxemia -initial concern for possible aspiration pneumonitis vs HAP.  FOB concerning for aspiration.  -2/25 some pulm edema, received lasix overnight  P: Weaned from BiPAP to HFNC this morning Mobilize / OOB to chair  Wean O2 for sats > 88-95% Restarting vanc per pharm dosing  Continue zosyn  Follow CXR  Anxiety -Possible EtOH withdrawal, consumes at least 1 40oz/day at home x several years P Will start Stepdown CIWA protocol  Folate, Thiamine IV qD  Hypertension P: Wean cardene gtt as able  PT  lopressor > has had issues with significant bradycardia  Defer BP regimen to Cardiology / EP   Acute Kidney Injury -Cr 3.15 P Pharmacy dosing of abx Goal MAP > 65 for adequate renal perfusion Minimize  nephrotoxic agents Trend BMP, mag/phos replace PRN Trend UOP  Bradycardia/ Tachycardia -2/26 bradycardia, brief asystole with ROSC associated with bowel movement P: Continue ICU monitor Cardiology following appreciate recs  Left pleural effusion P: Defer to TCTS   Esophageal cancer P: Post operative care per TCTS   SIRS response -possible developing sepsis vs EtOH withdrawal, anxiety, reactive leukocytosis  -Lactic acid elevated 3.1  P Trend WBC, temperature, lactic acid  Check amylase, lipase Sending BCx, UA Continue Vanc/zosyn at this time  CT c/a/p ordered by primary team   Best practice:  Diet: NPO Pain/Anxiety/Delirium protocol (if indicated): CIWA stepdown VAP protocol (if indicated): n/a DVT prophylaxis: SCD GI prophylaxis: Famotidine Glucose control: SSI Mobility: OOB to chair Code Status: full Family Communication: Patient updated 2/26 Disposition: Continue ICU level of care  Labs   CBC: Recent Labs  Lab 06/03/18 0534  06/04/18 0412 06/05/18 0426 06/05/18 0549 06/06/18 0324 06/06/18 0556 06/07/18 0326 06/07/18 0733  WBC 15.4*  --  6.8 10.3  --  14.2*  --  14.7*  --   HGB 13.6   < > 9.7* 10.1* 10.9* 10.7* 10.2* 10.2* 10.2*  HCT 39.5   < > 28.7* 30.5* 32.0* 31.8* 30.0* 30.7* 30.0*  MCV 88.6  --  91.1 93.0  --  91.9  --  90.6  --   PLT 298  --  186 197  --  239  --  249  --    < > = values in this interval not displayed.    Basic Metabolic Panel: Recent Labs  Lab 06/03/18 0253  06/04/18 0412 06/05/18 0545 06/05/18 0549 06/06/18 0324 06/06/18 0556 06/07/18 0326 06/07/18 0733  NA 136   < > 135 139 139 140 140 140 143  K 4.4   < > 3.3* 4.0 3.8 5.1 3.5 3.3* 3.5  CL 96*  --  98 106  --  104  --  105  --   CO2 28  --  28 24  --  24  --  24  --   GLUCOSE 124*  --  237* 122*  --  161*  --  220*  --   BUN <5*  --  7 18  --  29*  --  32*  --   CREATININE 0.69  --  0.77 2.00*  --  3.01*  --  3.15*  --   CALCIUM 8.9  --  7.6* 7.2*  --  7.7*  --   7.6*  --   MG  --   --   --  1.7  --  2.3  --   --   --   PHOS  --   --   --  4.1  --  4.5  --   --   --    < > = values in this interval not displayed.   GFR: Estimated Creatinine Clearance: 21.1 mL/min (A) (by C-G formula based on SCr of 3.15 mg/dL (H)). Recent Labs  Lab 06/03/18 1155 06/03/18 1424 06/04/18 0412 06/05/18 0426 06/06/18 0324 06/07/18 0326  WBC  --   --  6.8 10.3 14.2* 14.7*  LATICACIDVEN 1.1 1.1  --   --   --   --     Liver Function Tests: Recent Labs  Lab 06/07/18 0326  AST 30  ALT 25  ALKPHOS 64  BILITOT 0.9  PROT 5.2*  ALBUMIN 2.0*   No results for input(s): LIPASE, AMYLASE in the last 168 hours. No results for input(s): AMMONIA in the last 168 hours.  ABG    Component Value Date/Time   PHART 7.479 (H) 06/07/2018 0733   PCO2ART 33.5 06/07/2018 0733   PO2ART 90.0 06/07/2018 0733   HCO3 24.8 06/07/2018 0733   TCO2 26 06/07/2018 0733   ACIDBASEDEF 3.0 (H) 05/30/2018 0400   O2SAT 98.0 06/07/2018 0733     Coagulation Profile: No results for input(s): INR, PROTIME in the last 168 hours.  Cardiac Enzymes: Recent Labs  Lab 06/03/18 1155  TROPONINI <0.03    HbA1C: No results found for: HGBA1C  CBG: Recent Labs  Lab 06/06/18 1201 06/06/18 1647 06/06/18 2011 06/06/18 2358 06/07/18 0345  GLUCAP 104* 130* 118* 137* 144*   Critical Care Time 50 minutes    Eliseo Gum MSN, AGACNP-BC [facility] 06/07/2018, 7:55 AM  Attending Note:  63 year old male with PMH of  Esophageal cancer s/p esophagectomy that had difficulty protecting his airway and was intubated and extubated 3 times.  This AM PCCM was called urgently bedside for respiratory failure requiring BiPAP with diffuse crackles.  An hour after BiPAP and patient appeared well and decision was made not to intubate.  I reviewed CXR myself, infiltrate noted.  Discussed with PCCM-NP.  Will continue support.  CIWA protocol ordered due history of etoh abuse.   Place on HFNC.  Continue ativan for now.    The patient is critically ill with multiple organ systems failure and requires high complexity decision making for assessment and support, frequent evaluation and titration of therapies, application of advanced monitoring technologies and extensive interpretation of multiple databases.   Critical Care Time devoted to patient care services described in this note is  33  Minutes. This time reflects time of care of this signee Dr [name]. This critical care time does not reflect procedure time, or teaching time or supervisory time of PA/NP/Med student/Med Resident etc but could involve care discussion time.  Rush Farmer, M.D. [facility] Pulmonary/Critical Care Medicine. Pager: [phone]. After hours pager: [phone].

## 2018-06-07 NOTE — Progress Notes (Addendum)
I was asked to see the patient by Dr. Servando Snare as recent events and evaluations by critical care and cardiology noted. Patient is awake and alert. He is on 12 L HFNC (previously on BIPAP, pulmonary exam no rhonchi or crackles), HR 120's or less (on Cardene drip at 5 and Lopressor 12.5 mg bid), oxygenation around 90%. He did desat into low 80's briefly. Lactic acid this am 3.1 and WBC 14,700. Patient with sepsis and source to be determined (?aspiration pneumonitis vs HAP). Previously given Vancomycin but stopped (creatinine up to 3.15) and on Zosyn. CT of abdomen and chest ordered. He has already been given Lasix 80 mg IV followed by 40 mg IV this am for pulmonary edema. On CIWA protocol as ? etoh withdrawal, etiology for anxiety? Patient is hypertnesive and was on Clonidine patch previously but it was stopped;will defer to cardiology. As discussed with Dr. Servando Snare, will give PRN BIPAP order (already present but will decrease oxygen saturation requirements). Extended BIPAP not optimal in light of recent surgery;however, must have effective oxygenation. Hopefully, can avoid re intubation

## 2018-06-08 ENCOUNTER — Inpatient Hospital Stay (HOSPITAL_COMMUNITY): Payer: Medicaid Other

## 2018-06-08 DIAGNOSIS — I10 Essential (primary) hypertension: Secondary | ICD-10-CM

## 2018-06-08 LAB — CBC
HCT: 28.6 % — ABNORMAL LOW (ref 39.0–52.0)
Hemoglobin: 9.5 g/dL — ABNORMAL LOW (ref 13.0–17.0)
MCH: 29.8 pg (ref 26.0–34.0)
MCHC: 33.2 g/dL (ref 30.0–36.0)
MCV: 89.7 fL (ref 80.0–100.0)
Platelets: 264 10*3/uL (ref 150–400)
RBC: 3.19 MIL/uL — ABNORMAL LOW (ref 4.22–5.81)
RDW: 13.8 % (ref 11.5–15.5)
WBC: 12.5 10*3/uL — ABNORMAL HIGH (ref 4.0–10.5)
nRBC: 0 % (ref 0.0–0.2)

## 2018-06-08 LAB — GLUCOSE, CAPILLARY
Glucose-Capillary: 130 mg/dL — ABNORMAL HIGH (ref 70–99)
Glucose-Capillary: 133 mg/dL — ABNORMAL HIGH (ref 70–99)
Glucose-Capillary: 134 mg/dL — ABNORMAL HIGH (ref 70–99)
Glucose-Capillary: 134 mg/dL — ABNORMAL HIGH (ref 70–99)
Glucose-Capillary: 143 mg/dL — ABNORMAL HIGH (ref 70–99)
Glucose-Capillary: 150 mg/dL — ABNORMAL HIGH (ref 70–99)
Glucose-Capillary: 155 mg/dL — ABNORMAL HIGH (ref 70–99)

## 2018-06-08 LAB — BASIC METABOLIC PANEL
Anion gap: 9 (ref 5–15)
BUN: 32 mg/dL — ABNORMAL HIGH (ref 6–20)
CO2: 24 mmol/L (ref 22–32)
Calcium: 6.9 mg/dL — ABNORMAL LOW (ref 8.9–10.3)
Chloride: 112 mmol/L — ABNORMAL HIGH (ref 98–111)
Creatinine, Ser: 2.6 mg/dL — ABNORMAL HIGH (ref 0.61–1.24)
GFR calc Af Amer: 30 mL/min — ABNORMAL LOW (ref >60)
GFR calc non Af Amer: 26 mL/min — ABNORMAL LOW (ref >60)
Glucose, Bld: 152 mg/dL — ABNORMAL HIGH (ref 70–99)
Potassium: 2.3 mmol/L — CL (ref 3.5–5.1)
Sodium: 145 mmol/L (ref 135–145)

## 2018-06-08 LAB — POTASSIUM: Potassium: 3.3 mmol/L — ABNORMAL LOW (ref 3.5–5.1)

## 2018-06-08 LAB — CULTURE, BLOOD (ROUTINE X 2)
Culture: NO GROWTH
Culture: NO GROWTH
Special Requests: ADEQUATE
Special Requests: ADEQUATE

## 2018-06-08 LAB — URINE CULTURE: Culture: NO GROWTH

## 2018-06-08 MED ORDER — POTASSIUM CHLORIDE 10 MEQ/100ML IV SOLN
10.0000 meq | INTRAVENOUS | Status: DC
  Filled 2018-06-08 (×2): qty 100

## 2018-06-08 MED ORDER — NICARDIPINE HCL IN NACL 40-0.83 MG/200ML-% IV SOLN
3.0000 mg/h | INTRAVENOUS | Status: DC
  Administered 2018-06-08: 10.5 mg/h via INTRAVENOUS
  Administered 2018-06-08: 5 mg/h via INTRAVENOUS
  Administered 2018-06-08: 10.5 mg/h via INTRAVENOUS
  Administered 2018-06-08: 7 mg/h via INTRAVENOUS
  Administered 2018-06-08: 9.5 mg/h via INTRAVENOUS
  Administered 2018-06-09: 4 mg/h via INTRAVENOUS
  Administered 2018-06-09: 7 mg/h via INTRAVENOUS
  Administered 2018-06-09: 7.5 mg/h via INTRAVENOUS
  Administered 2018-06-10: 10 mg/h via INTRAVENOUS
  Administered 2018-06-10: 7.5 mg/h via INTRAVENOUS
  Administered 2018-06-10: 10 mg/h via INTRAVENOUS
  Administered 2018-06-10: 5 mg/h via INTRAVENOUS
  Administered 2018-06-11: 7.5 mg/h via INTRAVENOUS
  Filled 2018-06-08 (×13): qty 200

## 2018-06-08 MED ORDER — POTASSIUM CHLORIDE 10 MEQ/50ML IV SOLN
10.0000 meq | INTRAVENOUS | Status: AC
  Administered 2018-06-08 (×3): 10 meq via INTRAVENOUS
  Filled 2018-06-08 (×4): qty 50

## 2018-06-08 MED ORDER — FUROSEMIDE 10 MG/ML IJ SOLN
40.0000 mg | Freq: Once | INTRAMUSCULAR | Status: AC
  Administered 2018-06-08: 40 mg via INTRAVENOUS
  Filled 2018-06-08: qty 4

## 2018-06-08 NOTE — Progress Notes (Signed)
2  NAME:  Aaron Wang, MRN:  [id number], DOB:  1956/03/01, LOS: 22 ADMISSION DATE:  05/29/2018, CONSULTATION DATE:  06/01/2018 REFERRING MD:  Cyndia Bent, CHIEF COMPLAINT:  Dyspnea   Brief History   63 year old male status post esophagectomy 2/17 in the setting of squamous cell carcinoma.  Elective bronchoscopy, transhiatal total esophagectomy with cervical esophagogastrostomy, pyloromyotomy, feeding jejunostomy tube, placement of left chest tube.  Postoperatively he initially did well but over the course of 48 hours from February 20 through February 22nd he had hypertension, increasing shortness of breath. Intubated for respiratory distress.  Past Medical History  Gastroesophageal reflux disease, hypertension, squamous cell carcinoma of the esophagus, carpal tunnel syndrome  Significant Hospital Events   2/17 admission, esophagogastrogastrostomy 2/25 BiPap at 300 am. 2x bradycardia with brief asystole, associated with bowel movement. Spontaneous ROSC. Transitioned to HFNC 2/26 Intermittent BiPAP use  2/27 No BiPAP overnight  Consults:  PCCM    Procedures:  2/17 Esophagogastrogastrastostomy 2/22 PICC line 2/22 endotracheal tube 2/22 bronchoscopy: gastric secretions in trachea  Significant Diagnostic Tests:  June 03, 2018 echocardiogram LVEF 60 to 65%, mildly increased left ventricular wall thickness, impaired relaxation, RVSP 36, normal RV systolic function, pericardial effusion posterior to the left ventricle, mild to moderate tricuspid regurgitation, mild calcification of aortic valve, moderate left effusion  Micro Data:  BCx2 2/22 >>   BAL 2/22 >> negative BCx 2/26 >>> UA 2/26 >>> negative   Antimicrobials:  Vanco 2/22 >>  Zosyn 2/22 >>   Interim history/subjective:  Pt remains off bipap > 24 hours.  On HFNC O2   Objective   Blood pressure (!) 145/81, pulse (!) 113, temperature 98 F (36.7 C), temperature source Oral, resp. rate (!) 36, height _0  (1.702 m), weight 61.7  kg, SpO2 92 %.    FiO2 (%):  [55 %] 55 %   Intake/Output Summary (Last 24 hours) at 06/08/2018 1159 Last data filed at 06/08/2018 1005 Gross per 24 hour  Intake 2676.65 ml  Output 1850 ml  Net 826.65 ml   Filed Weights   06/01/18 0600 06/05/18 0515 06/08/18 0615  Weight: 60.7 kg 59.8 kg 61.7 kg   Examination: General: adult male sitting on side of bed in NAD   HEENT: MM pink/moist, HFNC O2 Neuro: AAOx4, speech clear, MAE  CV: s1s2 irr irr, no m/r/g, tachy 120's-230's PULM: even/non-labored, lungs bilaterally clear  VH:QION, non-tender, bsx4 active  Extremities: warm/dry, no edema  Skin: no rashes or lesions  Resolved Hospital Problem list     Assessment & Plan:    Acute respiratory failure with hypoxemia -initial concern for possible aspiration pneumonitis vs HAP.  FOB concerning for aspiration.  -interstitial edema, effusion on CXR 2/27 P: Continue HFNC O2  Wean for sats > 90% Continue empiric abx  Follow CXR  Pulmonary hygiene - IS, mobilize Lasix 40 mg IV x1  Anxiety -Possible EtOH withdrawal, consumes at least 1 40oz/day at home x several years P Continue CIWA protocol  Folate, thiamine IV   Hypertension P: Wean cardene gtt  PT lopressor > note has had significant issues with bradycardia  Defer BP regimen to Cardiology / EP   Acute Kidney Injury -Cr 3.15 P: Trend BMP / urinary output Replace electrolytes as indicated Avoid nephrotoxic agents, ensure adequate renal perfusion Renal dose medications  Bradycardia/ Tachycardia -2/26 bradycardia, brief asystole with ROSC associated with bowel movement P: ICU monitoring  Appreciate Cardiology   Left pleural effusion P: Defer to TCTS   Esophageal cancer P: Post  operative care per TCTS   SIRS response -possible developing sepsis vs EtOH withdrawal, anxiety, reactive leukocytosis  -Lactic acid elevated 3.1  P Follow fever curve, WBC, cultures  Continue empiric abx for now  Await CT ABD/Pelvis     Best practice:  Diet: NPO Pain/Anxiety/Delirium protocol (if indicated): CIWA stepdown VAP protocol (if indicated): n/a DVT prophylaxis: SCD GI prophylaxis: Famotidine Glucose control: SSI Mobility: OOB to chair Code Status: full Family Communication: Patient updated 2/27 Disposition: ICU   Labs   CBC: Recent Labs  Lab 06/04/18 0412 06/05/18 0426  06/06/18 0324 06/06/18 0556 06/07/18 0326 06/07/18 0733 06/08/18 0434  WBC 6.8 10.3  --  14.2*  --  14.7*  --  12.5*  HGB 9.7* 10.1*   < > 10.7* 10.2* 10.2* 10.2* 9.5*  HCT 28.7* 30.5*   < > 31.8* 30.0* 30.7* 30.0* 28.6*  MCV 91.1 93.0  --  91.9  --  90.6  --  89.7  PLT 186 197  --  239  --  249  --  264   < > = values in this interval not displayed.    Basic Metabolic Panel: Recent Labs  Lab 06/04/18 0412 06/05/18 0545  06/06/18 0324 06/06/18 0556 06/07/18 0326 06/07/18 0733 06/08/18 0434  NA 135 139   < > 140 140 140 143 145  K 3.3* 4.0   < > 5.1 3.5 3.3* 3.5 2.3*  CL 98 106  --  104  --  105  --  112*  CO2 28 24  --  24  --  24  --  24  GLUCOSE 237* 122*  --  161*  --  220*  --  152*  BUN 7 18  --  29*  --  32*  --  32*  CREATININE 0.77 2.00*  --  3.01*  --  3.15*  --  2.60*  CALCIUM 7.6* 7.2*  --  7.7*  --  7.6*  --  6.9*  MG  --  1.7  --  2.3  --   --   --   --   PHOS  --  4.1  --  4.5  --   --   --   --    < > = values in this interval not displayed.   GFR: Estimated Creatinine Clearance: 26.4 mL/min (A) (by C-G formula based on SCr of 2.6 mg/dL (H)). Recent Labs  Lab 06/03/18 1155 06/03/18 1424  06/05/18 0426 06/06/18 0324 06/07/18 0326 06/07/18 0730 06/07/18 1030 06/08/18 0434  WBC  --   --    < > 10.3 14.2* 14.7*  --   --  12.5*  LATICACIDVEN 1.1 1.1  --   --   --   --  3.1* 1.5  --    < > = values in this interval not displayed.    Liver Function Tests: Recent Labs  Lab 06/07/18 0326  AST 30  ALT 25  ALKPHOS 64  BILITOT 0.9  PROT 5.2*  ALBUMIN 2.0*   Recent Labs  Lab  06/07/18 0740  LIPASE 40  AMYLASE 218*   No results for input(s): AMMONIA in the last 168 hours.  ABG    Component Value Date/Time   PHART 7.479 (H) 06/07/2018 0733   PCO2ART 33.5 06/07/2018 0733   PO2ART 90.0 06/07/2018 0733   HCO3 24.8 06/07/2018 0733   TCO2 26 06/07/2018 0733   ACIDBASEDEF 3.0 (H) 05/30/2018 0400   O2SAT 98.0 06/07/2018 0733  Coagulation Profile: No results for input(s): INR, PROTIME in the last 168 hours.  Cardiac Enzymes: Recent Labs  Lab 06/03/18 1155  TROPONINI <0.03    HbA1C: No results found for: HGBA1C  CBG: Recent Labs  Lab 06/07/18 1935 06/07/18 2341 06/08/18 0442 06/08/18 0754 06/08/18 1118  GLUCAP 127* 133* 143* 134* 150*     Noe Gens, NP-C [facility] & Critical [facility] Pgr: [phone] or if no answer [phone] 06/08/2018, 12:00 PM

## 2018-06-08 NOTE — Progress Notes (Addendum)
[address]       [place] [address]             [phone]      10 Days Post-Op Procedure(s) (LRB): TRANSHIATAL TOTAL ESOPHAGECTOMY (N/A) PYLOROMYOTOMY (N/A) FEEDING JEJUNOSTOMY (N/A) CERVICOESOPHAGO GASTROSTOMY (Right) Flexible Bronchoscopy LEFT CHEST TUBE INSERTION (Left) Subjective: Hypertensive and tachy. Requiring high flow [place]- good sats  Objective: Vital signs in last 24 hours: Temp:  [97.4 F (36.3 C)-98.1 F (36.7 C)] 98.1 F (36.7 C) (02/27 0756) Pulse Rate:  [89-136] 122 (02/27 0800) Cardiac Rhythm: Sinus tachycardia (02/26 2000) Resp:  [20-41] 35 (02/27 0800) BP: (132-180)/(72-119) 155/80 (02/27 0800) SpO2:  [85 %-100 %] 90 % (02/27 0845) FiO2 (%):  [55 %] 55 % (02/26 1435) Weight:  [61.7 kg] 61.7 kg (02/27 0615)  Hemodynamic parameters for last 24 hours:    Intake/Output from previous day: 02/26 0701 - 02/27 0700 In: 3130.5 [I.V.:1429.8; NG/GT:950; IV Piggyback:650.7] Out: 1950 [Urine:1950] Intake/Output this shift: No intake/output data recorded.  General appearance: alert, cooperative and no distress Heart: regular rate and rhythm Lungs: dim in lower fiellds Abdomen: soft, mild TTP Extremities: no edema or calf tenderness Wound: incis healing well  Lab Results: Recent Labs    06/07/18 0326 06/07/18 0733 06/08/18 0434  WBC 14.7*  --  12.5*  HGB 10.2* 10.2* 9.5*  HCT 30.7* 30.0* 28.6*  PLT 249  --  264   BMET:  Recent Labs    06/07/18 0326 06/07/18 0733 06/08/18 0434  NA 140 143 145  K 3.3* 3.5 2.3*  CL 105  --  112*  CO2 24  --  24  GLUCOSE 220*  --  152*  BUN 32*  --  32*  CREATININE 3.15*  --  2.60*  CALCIUM 7.6*  --  6.9*    PT/INR: No results for input(s): LABPROT, INR in the last 72 hours. ABG    Component Value Date/Time   PHART 7.479 (H) 06/07/2018 0733   HCO3 24.8 06/07/2018 0733   TCO2 26 06/07/2018 0733   ACIDBASEDEF 3.0 (H) 05/30/2018 0400   O2SAT 98.0 06/07/2018 0733   CBG (last 3)   Recent Labs    06/07/18 2341 06/08/18 0442 06/08/18 0754  GLUCAP 133* 143* 134*    Meds Scheduled Meds: . chlorhexidine gluconate (MEDLINE KIT)  15 mL Mouth Rinse BID  . Chlorhexidine Gluconate Cloth  6 each Topical Daily  . feeding supplement (JEVITY 1.2 CAL)  1,000 mL Per Tube Q24H  . folic acid  1 mg Intravenous Daily  . insulin aspart  0-24 Units Subcutaneous Q4H  . pantoprazole (PROTONIX) IV  40 mg Intravenous Q12H  . sodium chloride flush  10-40 mL Intracatheter Q12H  . thiamine injection  100 mg Intravenous Daily  . vancomycin variable dose per unstable renal function (pharmacist dosing)   Does not apply See admin instructions   Continuous Infusions: . sodium chloride Stopped (06/04/18 1612)  . dextrose 5 % and 0.9% NaCl Stopped (06/08/18 0441)  . famotidine (PEPCID) IV Stopped (06/07/18 1337)  . niCARDipine 9.5 mg/hr (06/08/18 0600)  . piperacillin-tazobactam (ZOSYN)  IV 3.375 g (06/08/18 0720)  . potassium chloride    . potassium chloride Stopped (06/01/18 0956)   PRN Meds:.sodium chloride, oxyCODONE **AND** acetaminophen, fentaNYL (SUBLIMAZE) injection, hydrALAZINE, labetalol, LORazepam, ondansetron (ZOFRAN) IV, potassium chloride, sodium chloride flush  Xrays Dg Chest Port 1 View  Result Date: 06/07/2018 CLINICAL DATA:  Respiratory distress. Shortness of breath tonight. EXAM: PORTABLE  CHEST 1 VIEW COMPARISON:  06/06/2018 FINDINGS: Cardiac enlargement. Diffuse interstitial infiltrates throughout the lungs likely representing interstitial edema. Small bilateral pleural effusions. No pneumothorax. Right PICC line with tip over the low SVC region. Calcification of the aorta. Mild progression since previous study. Endotracheal and enteric tubes have been removed in the interval. IMPRESSION: Cardiac enlargement with diffuse interstitial edema and small bilateral pleural effusions. Mild progression since previous study. Electronically Signed   By: Lucienne Capers M.D.   On:  06/07/2018 03:20    Assessment/Plan: S/P Procedure(s) (LRB): TRANSHIATAL TOTAL ESOPHAGECTOMY (N/A) PYLOROMYOTOMY (N/A) FEEDING JEJUNOSTOMY (N/A) CERVICOESOPHAGO GASTROSTOMY (Right) Flexible Bronchoscopy LEFT CHEST TUBE INSERTION (Left)  1 clinically improved today , although still with high FiO2 requirement, hypertensive and tachycardic- plans for CT scans noted when can tol trip to radiology Concern for sepsis- conts current ABX, leukocytosis trend improved, no fevers, lactate trend is improving 2 hypokalemia- replace, creat trend improving, 2.6 today, BUN 32,UOP is improved with lasix pharmacy manageing vanco dosing 3 cont cardene gtt for HTN 4 H/H a little lower, - monitor, not in transfusion threshold  5 conts aggressive medical management per PCCM  LOS: 10 days    [name] PA-C 06/08/2018 Pager [phone]  Slightly improved  Cr decreasing Transition back to clonidine patch and off drip of cardene Will need swallow when stable to go to I have seen and examined Perlie Mayo and agree with the above assessment  and plan.  [name] MD Beeper [phone] Office [phone] 06/08/2018 10:27 AM  xray

## 2018-06-08 NOTE — Progress Notes (Addendum)
Progress Note  Patient Name: Aaron Wang Date of Encounter: 06/08/2018  Primary Cardiologist: No primary care provider on file.   Subjective   Significantly improved today. He tells me he is a very anxious person and with the recent surgery and his wife dying in December things have been worse. He is hungry. He wants to leave because he has bills to pay.   Inpatient Medications    Scheduled Meds: . chlorhexidine gluconate (MEDLINE KIT)  15 mL Mouth Rinse BID  . Chlorhexidine Gluconate Cloth  6 each Topical Daily  . feeding supplement (JEVITY 1.2 CAL)  1,000 mL Per Tube Q24H  . folic acid  1 mg Intravenous Daily  . insulin aspart  0-24 Units Subcutaneous Q4H  . pantoprazole (PROTONIX) IV  40 mg Intravenous Q12H  . sodium chloride flush  10-40 mL Intracatheter Q12H  . thiamine injection  100 mg Intravenous Daily  . vancomycin variable dose per unstable renal function (pharmacist dosing)   Does not apply See admin instructions   Continuous Infusions: . sodium chloride Stopped (06/04/18 1612)  . dextrose 5 % and 0.9% NaCl Stopped (06/08/18 0441)  . famotidine (PEPCID) IV Stopped (06/07/18 1337)  . niCARDipine 9.5 mg/hr (06/08/18 0600)  . piperacillin-tazobactam (ZOSYN)  IV 3.375 g (06/08/18 0720)  . potassium chloride    . potassium chloride Stopped (06/01/18 0956)   PRN Meds: sodium chloride, oxyCODONE **AND** acetaminophen, fentaNYL (SUBLIMAZE) injection, hydrALAZINE, labetalol, LORazepam, ondansetron (ZOFRAN) IV, potassium chloride, sodium chloride flush   Vital Signs    Vitals:   06/08/18 0630 06/08/18 0645 06/08/18 0700 06/08/18 0756  BP: (!) 145/78 (!) 148/79 (!) 143/77   Pulse: (!) 108 (!) 101 (!) 107   Resp: (!) 41 (!) 36 (!) 35   Temp:    98.1 F (36.7 C)  TempSrc:    Oral  SpO2: 100% 99% 95%   Weight:      Height:        Intake/Output Summary (Last 24 hours) at 06/08/2018 0824 Last data filed at 06/08/2018 0600 Gross per 24 hour  Intake 2925.23 ml    Output 1950 ml  Net 975.23 ml   Filed Weights   06/01/18 0600 06/05/18 0515 06/08/18 0615  Weight: 60.7 kg 59.8 kg 61.7 kg   Telemetry    Sinus tach with no further bradycardia - Personally Reviewed  ECG    No new EKG  Physical Exam   GEN: No acute distress.   Neck: No JVD Cardiac: Tachycardic but regular rhythm, no murmurs, rubs, or gallops.  Respiratory: Rhonchi bilateral  GI: Soft, nontender, non-distended  MS: No edema; No deformity. Neuro:  Nonfocal  Psych: Normal affect   Labs    Chemistry Recent Labs  Lab 06/06/18 0324  06/07/18 0326 06/07/18 0733 06/08/18 0434  NA 140   < > 140 143 145  K 5.1   < > 3.3* 3.5 2.3*  CL 104  --  105  --  112*  CO2 24  --  24  --  24  GLUCOSE 161*  --  220*  --  152*  BUN 29*  --  32*  --  32*  CREATININE 3.01*  --  3.15*  --  2.60*  CALCIUM 7.7*  --  7.6*  --  6.9*  PROT  --   --  5.2*  --   --   ALBUMIN  --   --  2.0*  --   --   AST  --   --  30  --   --   ALT  --   --  25  --   --   ALKPHOS  --   --  64  --   --   BILITOT  --   --  0.9  --   --   GFRNONAA 21*  --  20*  --  26*  GFRAA 25*  --  24*  --  30*  ANIONGAP 12  --  11  --  9   < > = values in this interval not displayed.    Hematology Recent Labs  Lab 06/06/18 0324  06/07/18 0326 06/07/18 0733 06/08/18 0434  WBC 14.2*  --  14.7*  --  12.5*  RBC 3.46*  --  3.39*  --  3.19*  HGB 10.7*   < > 10.2* 10.2* 9.5*  HCT 31.8*   < > 30.7* 30.0* 28.6*  MCV 91.9  --  90.6  --  89.7  MCH 30.9  --  30.1  --  29.8  MCHC 33.6  --  33.2  --  33.2  RDW 13.6  --  13.7  --  13.8  PLT 239  --  249  --  264   < > = values in this interval not displayed.   Cardiac Enzymes Recent Labs  Lab 06/03/18 1155  TROPONINI <0.03   No results for input(s): TROPIPOC in the last 168 hours.   BNP Recent Labs  Lab 06/07/18 0326  BNP 436.2*    DDimer No results for input(s): DDIMER in the last 168 hours.   Radiology    Dg Chest Port 1 View  Result Date:  06/07/2018 CLINICAL DATA:  Respiratory distress. Shortness of breath tonight. EXAM: PORTABLE CHEST 1 VIEW COMPARISON:  06/06/2018 FINDINGS: Cardiac enlargement. Diffuse interstitial infiltrates throughout the lungs likely representing interstitial edema. Small bilateral pleural effusions. No pneumothorax. Right PICC line with tip over the low SVC region. Calcification of the aorta. Mild progression since previous study. Endotracheal and enteric tubes have been removed in the interval. IMPRESSION: Cardiac enlargement with diffuse interstitial edema and small bilateral pleural effusions. Mild progression since previous study. Electronically Signed   By: Lucienne Capers M.D.   On: 06/07/2018 03:20   Cardiac Studies   TTE 06/03/2018 1. The left ventricle has normal systolic function with an ejection fraction of 60-65%. The cavity size was normal. There is mildly increased left ventricular wall thickness. Left ventricular diastolic Doppler parameters are consistent with impaired  relaxation No evidence of left ventricular regional wall motion abnormalities. 2. The right ventricle has normal systolic function. The cavity was normal. There is no increase in right ventricular wall thickness. Estimated RVSP 36.4 mmHg. 3. The pericardial effusion is posterior to the left ventricle. 4. Trivial pericardial effusion is present. 5. The aortic valve is tricuspid. Mild calcification of the aortic valve.. Mild to moderate aortic annular calcification noted. 6. The mitral valve is normal in structure. 7. The tricuspid valve is normal in structure. Tricuspid valve regurgitation is mild-moderate. 8. The aortic root is normal in size and structure. 9. Moderate pleural effusion in the left lateral region.  Patient Profile   Aaron Wang is a 63 y.o. male with a hx of HTN and esophageal cancer who is being seen for the evaluation of bradycardia.  Assessment & Plan    Sinus Bradycardia / Tachycardia - No  further bradycardia since 7 am on 06/07/2018 - On Nicardipine drip for HTN.  - On PRN  labetalol and hydralazine   Will discuss the case further with Dr. Irish Lack.   For questions or updates, please contact [place] Please consult [email or web address] for contact info under Cardiology/STEMI.   Signed, Ina Homes, MD  06/08/2018, 8:24 AM    I have examined the patient and reviewed assessment and plan and discussed with patient.  Agree with above as stated.  CONtinue Cardene for BP.  Avoid hydralazine as that can cuase some tachycardia which has been an issue.  Not taking PO as of yet.  Consdier low dose metoprolol when he is swallowing.  Prn labetolol until that time.   Larae Grooms

## 2018-06-08 NOTE — Progress Notes (Signed)
      [address]       [place] [address]             [phone]      POD # 10 esophagectomy  Was able to ambulate today  BP 133/74   Pulse (!) 103   Temp 97.6 F (36.4 C) (Oral)   Resp (!) 25   Ht 5\' 7"  (1.702 m)   Wt 61.7 kg   SpO2 97%   BMI 21.30 kg/m   Still on 15L HFNC  Intake/Output Summary (Last 24 hours) at 06/08/2018 1917 Last data filed at 06/08/2018 1837 Gross per 24 hour  Intake 2827.1 ml  Output 1600 ml  Net 1227.1 ml   Continue current Rx  Steven C. Roxan Hockey, MD [facility] [phone]

## 2018-06-08 NOTE — Progress Notes (Addendum)
K 2.3 this AM. Creatinine 2.6. U/O for this shift 550. Does not meet criteria for nurse driven CVTS replacement protocol,  CCM notified for further orders.

## 2018-06-09 ENCOUNTER — Inpatient Hospital Stay (HOSPITAL_COMMUNITY): Payer: Medicaid Other

## 2018-06-09 DIAGNOSIS — E43 Unspecified severe protein-calorie malnutrition: Secondary | ICD-10-CM

## 2018-06-09 LAB — LACTATE DEHYDROGENASE, PLEURAL OR PERITONEAL FLUID: LD, Fluid: 117 U/L — ABNORMAL HIGH (ref 3–23)

## 2018-06-09 LAB — BODY FLUID CELL COUNT WITH DIFFERENTIAL
Eos, Fluid: 0 %
Lymphs, Fluid: 94 %
Monocyte-Macrophage-Serous Fluid: 6 % — ABNORMAL LOW (ref 50–90)
Neutrophil Count, Fluid: 0 % (ref 0–25)
Total Nucleated Cell Count, Fluid: 345 cu mm (ref 0–1000)

## 2018-06-09 LAB — GRAM STAIN

## 2018-06-09 LAB — CBC
HCT: 29.3 % — ABNORMAL LOW (ref 39.0–52.0)
Hemoglobin: 9.6 g/dL — ABNORMAL LOW (ref 13.0–17.0)
MCH: 30 pg (ref 26.0–34.0)
MCHC: 32.8 g/dL (ref 30.0–36.0)
MCV: 91.6 fL (ref 80.0–100.0)
Platelets: 328 10*3/uL (ref 150–400)
RBC: 3.2 MIL/uL — AB (ref 4.22–5.81)
RDW: 14.2 % (ref 11.5–15.5)
WBC: 11.4 10*3/uL — ABNORMAL HIGH (ref 4.0–10.5)
nRBC: 0 % (ref 0.0–0.2)

## 2018-06-09 LAB — GLUCOSE, CAPILLARY
Glucose-Capillary: 108 mg/dL — ABNORMAL HIGH (ref 70–99)
Glucose-Capillary: 122 mg/dL — ABNORMAL HIGH (ref 70–99)
Glucose-Capillary: 140 mg/dL — ABNORMAL HIGH (ref 70–99)
Glucose-Capillary: 152 mg/dL — ABNORMAL HIGH (ref 70–99)
Glucose-Capillary: 172 mg/dL — ABNORMAL HIGH (ref 70–99)
Glucose-Capillary: 95 mg/dL (ref 70–99)

## 2018-06-09 LAB — BASIC METABOLIC PANEL
ANION GAP: 7 (ref 5–15)
BUN: 34 mg/dL — ABNORMAL HIGH (ref 6–20)
CO2: 25 mmol/L (ref 22–32)
Calcium: 7.3 mg/dL — ABNORMAL LOW (ref 8.9–10.3)
Chloride: 118 mmol/L — ABNORMAL HIGH (ref 98–111)
Creatinine, Ser: 2.41 mg/dL — ABNORMAL HIGH (ref 0.61–1.24)
GFR calc Af Amer: 33 mL/min — ABNORMAL LOW (ref >60)
GFR calc non Af Amer: 28 mL/min — ABNORMAL LOW (ref >60)
Glucose, Bld: 119 mg/dL — ABNORMAL HIGH (ref 70–99)
Potassium: 2.8 mmol/L — ABNORMAL LOW (ref 3.5–5.1)
Sodium: 150 mmol/L — ABNORMAL HIGH (ref 135–145)

## 2018-06-09 LAB — GLUCOSE, PLEURAL OR PERITONEAL FLUID: Glucose, Fluid: 147 mg/dL

## 2018-06-09 LAB — VANCOMYCIN, RANDOM: Vancomycin Rm: 13

## 2018-06-09 LAB — PROTEIN, PLEURAL OR PERITONEAL FLUID: Total protein, fluid: <3 g/dL

## 2018-06-09 MED ORDER — FUROSEMIDE 10 MG/ML IJ SOLN
40.0000 mg | Freq: Once | INTRAMUSCULAR | Status: AC
  Administered 2018-06-09: 40 mg via INTRAVENOUS
  Filled 2018-06-09: qty 4

## 2018-06-09 MED ORDER — OSMOLITE 1.2 CAL PO LIQD
1000.0000 mL | ORAL | Status: DC
  Administered 2018-06-09 – 2018-06-18 (×10): 1000 mL
  Filled 2018-06-09 (×20): qty 1000

## 2018-06-09 MED ORDER — VANCOMYCIN HCL IN DEXTROSE 1-5 GM/200ML-% IV SOLN
1000.0000 mg | Freq: Once | INTRAVENOUS | Status: AC
  Administered 2018-06-09: 1000 mg via INTRAVENOUS
  Filled 2018-06-09: qty 200

## 2018-06-09 MED ORDER — JEVITY 1.2 CAL PO LIQD: 1000.0000 mL | ORAL | Status: DC

## 2018-06-09 MED ORDER — FREE WATER
100.0000 mL | Freq: Three times a day (TID) | Status: DC
  Administered 2018-06-09 – 2018-07-04 (×59): 100 mL

## 2018-06-09 MED ORDER — LIDOCAINE HCL (PF) 1 % IJ SOLN
INTRAMUSCULAR | Status: AC
  Filled 2018-06-09: qty 30

## 2018-06-09 MED ORDER — POTASSIUM CHLORIDE 20 MEQ/15ML (10%) PO SOLN
40.0000 meq | ORAL | Status: AC
  Administered 2018-06-09 (×2): 40 meq
  Filled 2018-06-09 (×2): qty 30

## 2018-06-09 MED ORDER — CLONIDINE HCL 0.1 MG/24HR TD PTWK
0.1000 mg | MEDICATED_PATCH | TRANSDERMAL | Status: DC
  Administered 2018-06-09 – 2018-06-16 (×2): 0.1 mg via TRANSDERMAL
  Filled 2018-06-09 (×2): qty 1

## 2018-06-09 NOTE — Progress Notes (Signed)
Thoracentesis done at this time bedside with pt leaning over bedside table. Pt tolerated well. Vital signs stable. 2.5L removed of yellow/cloudy fluid. Xray done post procedure. RN will continue to monitor.

## 2018-06-09 NOTE — Progress Notes (Signed)
Patient ID: Aaron Wang, male   DOB: Nov 11, 1955, 63 y.o.   MRN: [id number] EVENING ROUNDS NOTE :     [address]       [place] [address]             [phone]                 11 Days Post-Op Procedure(s) (LRB): TRANSHIATAL TOTAL ESOPHAGECTOMY (N/A) PYLOROMYOTOMY (N/A) FEEDING JEJUNOSTOMY (N/A) CERVICOESOPHAGO GASTROSTOMY (Right) Flexible Bronchoscopy LEFT CHEST TUBE INSERTION (Left)  Total Length of Stay:  LOS: 11 days  BP (!) 159/80   Pulse (!) 124   Temp 97.6 F (36.4 C)   Resp (!) 35   Ht _0  (1.702 m)   Wt 61.7 kg   SpO2 95%   BMI 21.30 kg/m   .Intake/Output      02/27 0701 - 02/28 0700 02/28 0701 - 02/29 0700   I.V. (mL/kg) 942 (15.3) 471.7 (7.6)   Other     NG/GT 1050 877.5   IV Piggyback 210.3 298   Total Intake(mL/kg) 2202.4 (35.7) 1647.2 (26.7)   Urine (mL/kg/hr) 1300 (0.9) 650 (1)   Other  2500   Stool     Total Output 1300 3150   Net +902.4 -1502.8        Urine Occurrence 1 x      . sodium chloride 10 mL/hr at 06/09/18 1300  . famotidine (PEPCID) IV 20 mg (06/09/18 0949)  . feeding supplement (OSMOLITE 1.2 CAL) 1,000 mL (06/09/18 1735)  . niCARDipine 7.5 mg/hr (06/09/18 1435)  . piperacillin-tazobactam (ZOSYN)  IV 3.375 g (06/09/18 1559)     Lab Results  Component Value Date   WBC 11.4 (H) 06/09/2018   HGB 9.6 (L) 06/09/2018   HCT 29.3 (L) 06/09/2018   PLT 328 06/09/2018   GLUCOSE 119 (H) 06/09/2018   LDLDIRECT 112 (H) 11/26/2009   ALT 25 06/07/2018   AST 30 06/07/2018   NA 150 (H) 06/09/2018   K 2.8 (L) 06/09/2018   CL 118 (H) 06/09/2018   CREATININE 2.41 (H) 06/09/2018   BUN 34 (H) 06/09/2018   CO2 25 06/09/2018   TSH 0.925 11/26/2009   PSA 0.63 04/16/2010   INR 1.10 05/25/2018   MICROALBUR 0.50 01/01/2010   Cr improving Respiratory status improving, thoracentesis has helped  Cultures pending k replaced  I have seen and examined Aaron Wang and agree with the above assessment  and plan.  [name] MD Beeper [phone] Office [phone] 06/09/2018 5:42 PM     [name] MD  Beeper [phone] Office [phone] 06/09/2018 5:41 PM

## 2018-06-09 NOTE — Progress Notes (Signed)
Patient called nurse into room at 0457 stating he was feeling unwell. Patient did not seem to have any changes to assessment but then underwent a 7 second cardiac pause and returned into a sinus tachy rhythm with intermittent bradycardia. Blood pressure and oxygen stable on cardene and 13L/min of oxygen via high flow nasal cannula. A CBC and BMet had recently been sent prior to this episode.

## 2018-06-09 NOTE — Progress Notes (Addendum)
Progress Note  Patient Name: Aaron Wang Date of Encounter: 06/09/2018  Primary Cardiologist: No primary care provider on file.   Subjective   Patient doing well but still having difficulty breathing. He says that he is getting some fluid off his lung today.   Inpatient Medications    Scheduled Meds: . chlorhexidine gluconate (MEDLINE KIT)  15 mL Mouth Rinse BID  . Chlorhexidine Gluconate Cloth  6 each Topical Daily  . feeding supplement (JEVITY 1.2 CAL)  1,000 mL Per Tube Q24H  . folic acid  1 mg Intravenous Daily  . insulin aspart  0-24 Units Subcutaneous Q4H  . pantoprazole (PROTONIX) IV  40 mg Intravenous Q12H  . potassium chloride  40 mEq Per Tube Q4H  . sodium chloride flush  10-40 mL Intracatheter Q12H  . thiamine injection  100 mg Intravenous Daily  . vancomycin variable dose per unstable renal function (pharmacist dosing)   Does not apply See admin instructions   Continuous Infusions: . sodium chloride 10 mL/hr at 06/09/18 0300  . dextrose 5 % and 0.9% NaCl Stopped (06/08/18 0441)  . famotidine (PEPCID) IV Stopped (06/08/18 1158)  . niCARDipine 7 mg/hr (06/09/18 0409)  . piperacillin-tazobactam (ZOSYN)  IV 3.375 g (06/09/18 0620)  . potassium chloride 10 mEq (06/08/18 1616)  . vancomycin     PRN Meds: sodium chloride, oxyCODONE **AND** acetaminophen, fentaNYL (SUBLIMAZE) injection, hydrALAZINE, labetalol, LORazepam, ondansetron (ZOFRAN) IV, potassium chloride, sodium chloride flush   Vital Signs    Vitals:   06/09/18 0445 06/09/18 0500 06/09/18 0515 06/09/18 0700  BP: (!) 146/78 126/76 (!) 159/83   Pulse: (!) 110 63 (!) 113   Resp: (!) 28 (!) 22 (!) 27   Temp: (!) 97.4 F (36.3 C)   97.6 F (36.4 C)  TempSrc: Oral   Oral  SpO2: 95% (!) 82% 96%   Weight:      Height:        Intake/Output Summary (Last 24 hours) at 06/09/2018 0818 Last data filed at 06/09/2018 0500 Gross per 24 hour  Intake 2099.22 ml  Output 1300 ml  Net 799.22 ml   Filed Weights    06/01/18 0600 06/05/18 0515 06/08/18 0615  Weight: 60.7 kg 59.8 kg 61.7 kg   Telemetry    Sinus pause at ~5AM. Sinus tachycardia - Personally Reviewed  ECG    No new EKG  Physical Exam   GEN: No acute distress.   Neck: No JVD Cardiac: Tachycardic but regular rhythm, no murmurs, rubs, or gallops.  Respiratory: Rhonchi bilateral  GI: Soft, nontender, non-distended  MS: No edema; No deformity. Neuro:  Nonfocal  Psych: Normal affect   Labs    Chemistry Recent Labs  Lab 06/07/18 0326 06/07/18 0733 06/08/18 0434 06/08/18 1939 06/09/18 0415  NA 140 143 145  --  150*  K 3.3* 3.5 2.3* 3.3* 2.8*  CL 105  --  112*  --  118*  CO2 24  --  24  --  25  GLUCOSE 220*  --  152*  --  119*  BUN 32*  --  32*  --  34*  CREATININE 3.15*  --  2.60*  --  2.41*  CALCIUM 7.6*  --  6.9*  --  7.3*  PROT 5.2*  --   --   --   --   ALBUMIN 2.0*  --   --   --   --   AST 30  --   --   --   --  ALT 25  --   --   --   --   ALKPHOS 64  --   --   --   --   BILITOT 0.9  --   --   --   --   GFRNONAA 20*  --  26*  --  28*  GFRAA 24*  --  30*  --  33*  ANIONGAP 11  --  9  --  7    Hematology Recent Labs  Lab 06/07/18 0326 06/07/18 0733 06/08/18 0434 06/09/18 0415  WBC 14.7*  --  12.5* 11.4*  RBC 3.39*  --  3.19* 3.20*  HGB 10.2* 10.2* 9.5* 9.6*  HCT 30.7* 30.0* 28.6* 29.3*  MCV 90.6  --  89.7 91.6  MCH 30.1  --  29.8 30.0  MCHC 33.2  --  33.2 32.8  RDW 13.7  --  13.8 14.2  PLT 249  --  264 328   Cardiac Enzymes Recent Labs  Lab 06/03/18 1155  TROPONINI <0.03   No results for input(s): TROPIPOC in the last 168 hours.   BNP Recent Labs  Lab 06/07/18 0326  BNP 436.2*    DDimer No results for input(s): DDIMER in the last 168 hours.   Radiology    Dg Chest Port 1 View  Result Date: 06/08/2018 CLINICAL DATA:  64 year old male with a history esophagectomy EXAM: PORTABLE CHEST 1 VIEW COMPARISON:  06/07/2018 06/06/2018 FINDINGS: Cardiomediastinal silhouette unchanged in size  and contour, partially obscured by overlying lung and pleural disease. Interval replacement defibrillator pads. Right upper extremity PICC. Relatively rapid development of interlobular septal thickening throughout with fullness in the central vasculature. This is relatively new from the plain film of 06/06/2018, worst from the most recent comparison of 24 hours. Worsening opacities at the bilateral lung bases with obscuration of the bilateral hemidiaphragm. Increasing left-sided pleural fluid. No displaced fracture IMPRESSION: Recurrence of diffuse pulmonary edema, with increasing left greater than right pleural fluid. Interval replacement of defibrillator pads. Right upper extremity PICC Electronically Signed   By: Corrie Mckusick D.O.   On: 06/08/2018 09:30   Cardiac Studies   TTE 06/03/2018 1. The left ventricle has normal systolic function with an ejection fraction of 60-65%. The cavity size was normal. There is mildly increased left ventricular wall thickness. Left ventricular diastolic Doppler parameters are consistent with impaired  relaxation No evidence of left ventricular regional wall motion abnormalities. 2. The right ventricle has normal systolic function. The cavity was normal. There is no increase in right ventricular wall thickness. Estimated RVSP 36.4 mmHg. 3. The pericardial effusion is posterior to the left ventricle. 4. Trivial pericardial effusion is present. 5. The aortic valve is tricuspid. Mild calcification of the aortic valve.. Mild to moderate aortic annular calcification noted. 6. The mitral valve is normal in structure. 7. The tricuspid valve is normal in structure. Tricuspid valve regurgitation is mild-moderate. 8. The aortic root is normal in size and structure. 9. Moderate pleural effusion in the left lateral region.  Patient Profile   [name] is a 63 y.o. male with a hx of HTN and esophageal cancer who is being seen for the evaluation of  bradycardia.  Assessment & Plan    Sinus Bradycardia / Tachycardia - Sinus pause at ~5AM. Sinus tachycardia - Agree with CT surgery plan to start clonidine patch and wean Nicardipine drip.  - Plan for diureses today and left thoracentesis  - On PRN labetalol and hydralazine. Limit use of hydralazine.  -  Work towards stopping nodal agents.   Will discuss the case further with Dr. Irish Lack.   For questions or updates, please contact [place] Please consult [email or web address] for contact info under Cardiology/STEMI.   Signed, Ina Homes, MD  06/09/2018, 8:18 AM    I have examined the patient and reviewed assessment and plan and discussed with patient.  Agree with above as stated.    Trying to wean off of IV BP meds.  CLonidie patch started.  No further hypotension.  Still with intermittent bradycardia.  Avoiding rate slowing drugs. On ABx.  I still do not think he needs a pacemaker at this point, but if he continues to have sinus pauses as he gets more active post operatively, will have to reconsider.   Plan for thoracentesis today,  Larae Grooms

## 2018-06-09 NOTE — Progress Notes (Signed)
Nutrition Follow-up  DOCUMENTATION CODES:   Severe malnutrition in context of acute illness/injury  INTERVENTION:  Recommend changing formula to Osmolite 1.2.  Initiate Osmolite 1.2 at goal rate of 91m/hr (1565m which provides 1872 kcal, 87g protein, and 1279 ml of free water.   NUTRITION DIAGNOSIS:   Severe Malnutrition related to acute illness(s/p esophagectomy (esophageal cancer)) as evidenced by moderate fat depletion, severe fat depletion, moderate muscle depletion, mild muscle depletion Ongoing  GOAL:   Patient will meet greater than or equal to 90% of their needs  Progressing  MONITOR:   TF tolerance, Diet advancement, I & O's, Labs   ASSESSMENT:   [age]ear old male w/ PMH of HTN, GERD, and esophageal cancer. On 2/17 he underwent a thoracotomy, pyloroplasty, complete esophagectomy, and had a feeding jejunostomy placed.   2/22 Intubated d/t respiratory insufficiency, bronchoscopy performed and revealed pt aspirated on gastric contents. Green fluid was found in trachea running into each mainstem bronchus bilaterally 2/23 Extubated then re-intubated d/t inability to maintain oxygenation 2/23 CPR started after suffering asystolic arrest after laying flat for intubation 2/25 Extubated  Left thoracentesis planned for today. Some difficulty breathing.  Spoke with pt who was in bedside chair. Pt has been feeling poorly the last few days, with many loose stools throughout each day. Pt reported that he has gotten up and walked the unit some. Pt is on antibiotics which could contribute to loose stools but will plan on changing TF formula to see if that helps.  Re-checked NFPE, depletions have increased. Edema may be masking further depletions in lower extremities. Pt has not met estimated needs for at least 9 days as we have not met goal rate. As of today pt has just made it to 5564mr which is only providing 88% of his kcal and 86% of his protein needs. Previously this week pt  was only at a rate of 30-28m69m.   Pt has declined in nutritional status and now meets criteria for severe malnutrition related to acute injury (s/p esophagectomy).   TF regimen (Jev 1.2) at goal rate of 65ml51mwould provide 1872 kcal, 87 g protein, and 1259 ml free water.  Medications reviewed and include: folic acid '1mg'$ , insulin aspart 0-24 units, protonix, NS, thiamine '100mg'$  Labs reviewed: Na 150(H), K 2.8 (L), BUN 34 (H), creatinine 2.41 (H), CBG (122-152)     Most Recent Value  Orbital Region  Moderate depletion  Upper Arm Region  Severe depletion  Thoracic and Lumbar Region  Moderate depletion  Buccal Region  Mild depletion  Temple Region  Moderate depletion  Clavicle Bone Region  Moderate depletion  Clavicle and Acromion Bone Region  Moderate depletion  Scapular Bone Region  Moderate depletion  Dorsal Hand  Mild depletion  Patellar Region  Mild depletion  Anterior Thigh Region  No depletion  Posterior Calf Region  No depletion (edema)  Edema (RD Assessment)  Mild (lower extremities)  Hair  Reviewed  Eyes  Reviewed  Mouth  Reviewed  Skin  Reviewed  Nails  Reviewed     Diet Order:   Diet Order            Diet NPO time specified  Diet effective now              EDUCATION NEEDS:   Education needs have been addressed  Skin:  Skin Assessment: Skin Integrity Issues: Skin Integrity Issues:: Incisions Incisions: abdomen, neck  Last BM:  2/28 (2 loose this morning)  Height:   Ht Readings  from Last 1 Encounters:  05/29/18 '5\' 7"'$  (1.702 m)    Weight:   Wt Readings from Last 1 Encounters:  06/08/18 61.7 kg    Ideal Body Weight:  67.27 kg  BMI:  Body mass index is 21.3 kg/m.  Estimated Nutritional Needs:   Kcal:  1800-2000 kcal  Protein:  85-95 g  Fluid:  >/= 1.8L    [employer] Dietetic Intern

## 2018-06-09 NOTE — Progress Notes (Signed)
Potssium level 3.3 after replacement this AM. Called Critical Care e-link to review and provide orders as necessary. Will continue to monitor.

## 2018-06-09 NOTE — Procedures (Signed)
PROCEDURE SUMMARY:  Successful US guided left thoracentesis. Yielded 1.8 L of milky yellow fluid. Patient tolerated procedure well. No immediate complications. EBL = trace  Specimen was sent for labs.  Post procedure chest X-ray reveals no pneumothorax  Tiandra Swoveland S Addie Alonge PA-C 06/09/2018 1:06 PM

## 2018-06-09 NOTE — Progress Notes (Addendum)
NAME:  Aaron Wang, MRN:  [id number], DOB:  [birth year], LOS: 40 ADMISSION DATE:  05/29/2018, CONSULTATION DATE:  06/01/2018 REFERRING MD:  Cyndia Bent, CHIEF COMPLAINT:  Dyspnea   Brief History   63 year old male status post esophagectomy 2/17 in the setting of squamous cell carcinoma.  Elective bronchoscopy, transhiatal total esophagectomy with cervical esophagogastrostomy, pyloromyotomy, feeding jejunostomy tube, placement of left chest tube.  Postoperatively he initially did well but over the course of 48 hours from February 20 through February 22nd he had hypertension, increasing shortness of breath. Intubated for respiratory distress.  Past Medical History  Gastroesophageal reflux disease, hypertension, squamous cell carcinoma of the esophagus, carpal tunnel syndrome  Significant Hospital Events   2/17 admission, esophagogastrogastrostomy 2/25 BiPap at 300 am. 2x bradycardia with brief asystole, associated with bowel movement. Spontaneous ROSC. Transitioned to HFNC 2/26 Intermittent BiPAP use  2/27 No BiPAP overnight 2/28 O2 weaned to 12L, thora planned per IR   Consults:  PCCM    Procedures:  2/17 Esophagogastrogastrastostomy 2/22 PICC line 2/22 endotracheal tube 2/22 bronchoscopy: gastric secretions in trachea  Significant Diagnostic Tests:  June 03, 2018 echocardiogram LVEF 60 to 65%, mildly increased left ventricular wall thickness, impaired relaxation, RVSP 36, normal RV systolic function, pericardial effusion posterior to the left ventricle, mild to moderate tricuspid regurgitation, mild calcification of aortic valve, moderate left effusion  Micro Data:  BCx2 2/22 >>   BAL 2/22 >> negative BCx 2/26 >>> UA 2/26 >>> negative   Antimicrobials:  Vanco 2/22 >> 2/29 Zosyn 2/22 >> 2/29  Interim history/subjective:  Pt remains off bipap > 24 hours.  On HFNC O2 15L > reduced to 12L.  Afebrile. I/O - 1.9L UOP with lasix 2/27   Objective   Blood pressure (!) 165/93, pulse  (!) 121, temperature 97.6 F (36.4 C), temperature source Oral, resp. rate (!) 35, height _0  (1.702 m), weight 61.7 kg, SpO2 96 %.        Intake/Output Summary (Last 24 hours) at 06/09/2018 1056 Last data filed at 06/09/2018 1001 Gross per 24 hour  Intake 2577.15 ml  Output 1100 ml  Net 1477.15 ml   Filed Weights   06/01/18 0600 06/05/18 0515 06/08/18 0615  Weight: 60.7 kg 59.8 kg 61.7 kg   Examination: General: thin adult male sitting in chair in NAD HEENT: MM pink/moist, [place] O2 Neuro: AAOx4, speech clear, MAE  CV: s1s2 rrr, no m/r/g PULM: even/non-labored, lungs bilaterally clear anterior, diminished left base  VO:ZDGU, non-tender, bsx4 active  Extremities: warm/dry, no edema  Skin: no rashes or lesions  Resolved Hospital Problem list     Assessment & Plan:    Acute respiratory failure with hypoxemia -initial concern for possible aspiration pneumonitis vs HAP.  FOB concerning for aspiration.  -interstitial edema, effusion on CXR 2/27 P: Wean HFNC O2 for sats > 90% Empiric abx  Follow CXR  IR consulted for evaluation for left thoracentesis  Pulmonary hygiene-IS, mobilize  Repeat lasix 40 mg x1   Anxiety -Possible EtOH withdrawal, consumes at least 1 40oz/day at home x several years P CIWA protocol  Folate, thiamine IV  Hypertension P: Defer BP regimen to Cardiology  Cardene gtt > consider transition to oral calcium channel blocker  Lopressor per tube > note significant issues with brady / pauses  Hold home ACE-I  Acute Kidney Injury Hypernatremia  P: Free water 100 ml/Q8 Renal dose medications Trend BMP / urinary output Replace electrolytes as indicated Avoid nephrotoxic agents as able, ensure adequate renal perfusion  Bradycardia/ Tachycardia -2/26 bradycardia, brief asystole with ROSC associated with bowel movement P: ICU monitoring  Defer to Cardiology   Left pleural effusion P: IR consulted for left thoracentesis  Labs ordered for  assessment   Esophageal cancer P: Post operative care per [facility]   SIRS response -possible developing sepsis vs EtOH withdrawal, anxiety, reactive leukocytosis  -Lactic acid elevated 3.1  P Empiric antibiotics as above > stop date added for 2/29 CT ABD/Pelvis pending > unable to go due to brady issues / resp status   Best practice:  Diet: NPO Pain/Anxiety/Delirium protocol (if indicated): CIWA stepdown VAP protocol (if indicated): n/a DVT prophylaxis: SCD GI prophylaxis: Famotidine Glucose control: SSI Mobility: OOB to chair Code Status: full Family Communication: Patient updated on plan of care 2/28 Disposition: ICU   Labs   CBC: Recent Labs  Lab 06/05/18 0426  06/06/18 0324 06/06/18 0556 06/07/18 0326 06/07/18 0733 06/08/18 0434 06/09/18 0415  WBC 10.3  --  14.2*  --  14.7*  --  12.5* 11.4*  HGB 10.1*   < > 10.7* 10.2* 10.2* 10.2* 9.5* 9.6*  HCT 30.5*   < > 31.8* 30.0* 30.7* 30.0* 28.6* 29.3*  MCV 93.0  --  91.9  --  90.6  --  89.7 91.6  PLT 197  --  239  --  249  --  264 328   < > = values in this interval not displayed.    Basic Metabolic Panel: Recent Labs  Lab 06/05/18 0545  06/06/18 0324 06/06/18 0556 06/07/18 0326 06/07/18 0733 06/08/18 0434 06/08/18 1939 06/09/18 0415  NA 139   < > 140 140 140 143 145  --  150*  K 4.0   < > 5.1 3.5 3.3* 3.5 2.3* 3.3* 2.8*  CL 106  --  104  --  105  --  112*  --  118*  CO2 24  --  24  --  24  --  24  --  25  GLUCOSE 122*  --  161*  --  220*  --  152*  --  119*  BUN 18  --  29*  --  32*  --  32*  --  34*  CREATININE 2.00*  --  3.01*  --  3.15*  --  2.60*  --  2.41*  CALCIUM 7.2*  --  7.7*  --  7.6*  --  6.9*  --  7.3*  MG 1.7  --  2.3  --   --   --   --   --   --   PHOS 4.1  --  4.5  --   --   --   --   --   --    < > = values in this interval not displayed.   GFR: Estimated Creatinine Clearance: 28.4 mL/min (A) (by C-G formula based on SCr of 2.41 mg/dL (H)). Recent Labs  Lab 06/03/18 1155 06/03/18 1424   06/06/18 0324 06/07/18 0326 06/07/18 0730 06/07/18 1030 06/08/18 0434 06/09/18 0415  WBC  --   --    < > 14.2* 14.7*  --   --  12.5* 11.4*  LATICACIDVEN 1.1 1.1  --   --   --  3.1* 1.5  --   --    < > = values in this interval not displayed.    Liver Function Tests: Recent Labs  Lab 06/07/18 0326  AST 30  ALT 25  ALKPHOS 64  BILITOT 0.9  PROT 5.2*  ALBUMIN 2.0*   Recent Labs  Lab 06/07/18 0740  LIPASE 40  AMYLASE 218*   No results for input(s): AMMONIA in the last 168 hours.  ABG    Component Value Date/Time   PHART 7.479 (H) 06/07/2018 0733   PCO2ART 33.5 06/07/2018 0733   PO2ART 90.0 06/07/2018 0733   HCO3 24.8 06/07/2018 0733   TCO2 26 06/07/2018 0733   ACIDBASEDEF 3.0 (H) 05/30/2018 0400   O2SAT 98.0 06/07/2018 0733     Coagulation Profile: No results for input(s): INR, PROTIME in the last 168 hours.  Cardiac Enzymes: Recent Labs  Lab 06/03/18 1155  TROPONINI <0.03    HbA1C: No results found for: HGBA1C  CBG: Recent Labs  Lab 06/08/18 1626 06/08/18 1948 06/08/18 2341 06/09/18 0415 06/09/18 0802  GLUCAP 155* 134* 130* 122* 152*     Noe Gens, NP-C [facility] Pgr: [phone] or if no answer [phone] 06/09/2018, 10:56 AM

## 2018-06-09 NOTE — Progress Notes (Addendum)
[facility] DAILY ICU PROGRESS NOTE                   [place]            [place] [address]          [phone]   11 Days Post-Op Procedure(s) (LRB): TRANSHIATAL TOTAL ESOPHAGECTOMY (N/A) PYLOROMYOTOMY (N/A) FEEDING JEJUNOSTOMY (N/A) CERVICOESOPHAGO GASTROSTOMY (Right) Flexible Bronchoscopy LEFT CHEST TUBE INSERTION (Left)  Total Length of Stay:  LOS: 11 days   Subjective: Feels weak and SOB  Objective: Vital signs in last 24 hours: Temp:  [97.4 F (36.3 C)-98.1 F (36.7 C)] 97.4 F (36.3 C) (02/28 0445) Pulse Rate:  [63-131] 113 (02/28 0515) Cardiac Rhythm: Sinus tachycardia (02/28 0330) Resp:  [19-42] 27 (02/28 0515) BP: (114-160)/(71-100) 159/83 (02/28 0515) SpO2:  [82 %-99 %] 96 % (02/28 0515)  Filed Weights   06/01/18 0600 06/05/18 0515 06/08/18 0615  Weight: 60.7 kg 59.8 kg 61.7 kg    Weight change:    Hemodynamic parameters for last 24 hours:    Intake/Output from previous day: 02/27 0701 - 02/28 0700 In: 2202.4 [I.V.:942; NG/GT:1050; IV Piggyback:210.3] Out: 1300 [Urine:1300]  Intake/Output this shift: No intake/output data recorded.  Current Meds: Scheduled Meds: . chlorhexidine gluconate (MEDLINE KIT)  15 mL Mouth Rinse BID  . Chlorhexidine Gluconate Cloth  6 each Topical Daily  . feeding supplement (JEVITY 1.2 CAL)  1,000 mL Per Tube Q24H  . folic acid  1 mg Intravenous Daily  . insulin aspart  0-24 Units Subcutaneous Q4H  . pantoprazole (PROTONIX) IV  40 mg Intravenous Q12H  . potassium chloride  40 mEq Per Tube Q4H  . sodium chloride flush  10-40 mL Intracatheter Q12H  . thiamine injection  100 mg Intravenous Daily  . vancomycin variable dose per unstable renal function (pharmacist dosing)   Does not apply See admin instructions   Continuous Infusions: . sodium chloride 10 mL/hr at 06/09/18 0300  . dextrose 5 % and 0.9% NaCl Stopped (06/08/18 0441)  . famotidine (PEPCID) IV Stopped (06/08/18 1158)  . niCARDipine 7 mg/hr  (06/09/18 0409)  . piperacillin-tazobactam (ZOSYN)  IV 3.375 g (06/09/18 0620)  . potassium chloride 10 mEq (06/08/18 1616)  . vancomycin     PRN Meds:.sodium chloride, oxyCODONE **AND** acetaminophen, fentaNYL (SUBLIMAZE) injection, hydrALAZINE, labetalol, LORazepam, ondansetron (ZOFRAN) IV, potassium chloride, sodium chloride flush  General appearance: alert, cooperative, fatigued and mild distress Heart: regular rate and rhythm and tachy Lungs: dim left>right base Abdomen: mild distension, some incis tenderness Extremities: no edema Wound: incis healing well  Lab Results: CBC: Recent Labs    06/08/18 0434 06/09/18 0415  WBC 12.5* 11.4*  HGB 9.5* 9.6*  HCT 28.6* 29.3*  PLT 264 328   BMET:  Recent Labs    06/08/18 0434 06/08/18 1939 06/09/18 0415  NA 145  --  150*  K 2.3* 3.3* 2.8*  CL 112*  --  118*  CO2 24  --  25  GLUCOSE 152*  --  119*  BUN 32*  --  34*  CREATININE 2.60*  --  2.41*  CALCIUM 6.9*  --  7.3*    CMET: Lab Results  Component Value Date   WBC 11.4 (H) 06/09/2018   HGB 9.6 (L) 06/09/2018   HCT 29.3 (L) 06/09/2018   PLT 328 06/09/2018   GLUCOSE 119 (H) 06/09/2018   LDLDIRECT 112 (H) 11/26/2009   ALT 25 06/07/2018   AST 30 06/07/2018   NA 150 (  H) 06/09/2018   K 2.8 (L) 06/09/2018   CL 118 (H) 06/09/2018   CREATININE 2.41 (H) 06/09/2018   BUN 34 (H) 06/09/2018   CO2 25 06/09/2018   TSH 0.925 11/26/2009   PSA 0.63 04/16/2010   INR 1.10 05/25/2018   MICROALBUR 0.50 01/01/2010      PT/INR: No results for input(s): LABPROT, INR in the last 72 hours. Radiology: No results found. Results for orders placed or performed during the hospital encounter of 05/29/18  Culture, blood (Routine X 2) w Reflex to ID Panel     Status: None   Collection Time: 06/03/18 11:55 AM  Result Value Ref Range Status   Specimen Description BLOOD RIGHT HAND  Final   Special Requests   Final    BOTTLES DRAWN AEROBIC ONLY Blood Culture adequate volume   Culture    Final    NO GROWTH 5 DAYS Performed at [facility], [place] [address], [place], [place] [address]    Report Status 06/08/2018 FINAL  Final  Culture, blood (Routine X 2) w Reflex to ID Panel     Status: None   Collection Time: 06/03/18 11:59 AM  Result Value Ref Range Status   Specimen Description BLOOD LEFT ANTECUBITAL  Final   Special Requests   Final    BOTTLES DRAWN AEROBIC ONLY Blood Culture adequate volume   Culture   Final    NO GROWTH 5 DAYS Performed at [facility], [place] [address] [place], [place] [address]    Report Status 06/08/2018 FINAL  Final  Culture, respiratory (non-expectorated)     Status: None   Collection Time: 06/03/18  4:53 PM  Result Value Ref Range Status   Specimen Description BRONCHIAL ALVEOLAR LAVAGE  Final   Special Requests Normal  Final   Gram Stain   Final    FEW WBC PRESENT, PREDOMINANTLY PMN NO ORGANISMS SEEN    Culture   Final    NO GROWTH 2 DAYS Performed at [facility], [place] [address] [place], [place] [address]    Report Status 06/06/2018 FINAL  Final  Culture, Urine     Status: None   Collection Time: 06/07/18  7:59 AM  Result Value Ref Range Status   Specimen Description URINE, CATHETERIZED  Final   Special Requests NONE  Final   Culture   Final    NO GROWTH Performed at [facility], [address], [place], [place] [address]    Report Status 06/08/2018 FINAL  Final  Culture, blood (routine x 2)     Status: None (Preliminary result)   Collection Time: 06/07/18  8:15 AM  Result Value Ref Range Status   Specimen Description BLOOD LEFT ANTECUBITAL  Final   Special Requests   Final    BOTTLES DRAWN AEROBIC ONLY Blood Culture adequate volume   Culture   Final    NO GROWTH 1 DAY Performed at [facility], [place] [address] [place], [place] [address]    Report Status PENDING  Incomplete  Culture, blood (routine x 2)     Status: None (Preliminary result)   Collection Time: 06/07/18  8:21 AM  Result Value  Ref Range Status   Specimen Description BLOOD LEFT HAND  Final   Special Requests   Final    BOTTLES DRAWN AEROBIC ONLY Blood Culture adequate volume   Culture   Final    NO GROWTH 1 DAY Performed at [facility], [place]  [address], [place], [place] [address]    Report Status PENDING  Incomplete    Assessment/Plan: S/P Procedure(s) (LRB): TRANSHIATAL TOTAL ESOPHAGECTOMY (N/A) PYLOROMYOTOMY (N/A) FEEDING JEJUNOSTOMY (N/A) CERVICOESOPHAGO GASTROSTOMY (Right) Flexible Bronchoscopy LEFT CHEST TUBE INSERTION (Left)   1 remains hypertensive and tachycardic, on nicardipine- checking Co-ox panel 2 sats good on HFNC 3 CT scans not done yet 4 creat trend improving, BUN pretty stable at 34, good UOP but remains positive-he does have mild hypernatremia so will need to be careful with diuretics. K+ being replaced 5 pulm edema/ASD looks stable, Left effusion looks larger, plan for thoracentesis today 6 leukocytosis trend conts to improve, no fevers, conts ABX- empiric for now, pharmacy dosing vanco for renal dysfunction 7 BS control is pretty good 8 H/H stable 9 rehab as able- deconditioned and pulm status is limiting  10 increase TF's to max as able Aaron Giovanni PA-C 06/09/2018 7:39 AM  Pager [phone]  Plan thoracentesis today ir at bedside or ccm catapres patch resumed  I have seen and examined Perlie Mayo and agree with the above assessment  and plan.  [name] MD Beeper [phone] Office [phone] 06/09/2018 8:30 AM

## 2018-06-09 NOTE — Progress Notes (Signed)
[facility] Progress Note Patient Name: Aaron Wang DOB: 1955/05/27 MRN: [id number]   Date of Service  06/09/2018  HPI/Events of Note  Hypokalemia  eICU Interventions  Potassium replaced     Intervention Category Intermediate Interventions: Electrolyte abnormality - evaluation and management  DETERDING,ELIZABETH 06/09/2018, 5:52 AM

## 2018-06-09 NOTE — Progress Notes (Signed)
Pharmacy Antibiotic Note  Aaron Wang is a 63 y.o. male admitted on 05/29/2018 with pneumonia.  Pharmacy dosing vancomycin and Zosyn. D#7 of abx for asp vs. HCAP. WBC down to 11.4. SCr down to 2.41. Remains culture negative   A vanc level this AM was slightly subtherapeutic at 13 today.     Vanc (used Scr 1) 2/22 >> 2/25; 2/26>> (2/29) Zosyn 2/22>>(2/29)  2/22 BAL: pending 2/22 Bcx: negF 2/26 BCx >> ngtd 2/26 UCx >> negF  Plan: Zosyn 3.375g IV q8h (4 hour infusion).  Vancomycin 1 gm IV once. This should cover patient through tomorrow  Planning to stop antibiotic therapy tomorrow   Height: _0  (170.2 cm) Weight: 136 lb (61.7 kg) IBW/kg (Calculated) : 66.1  Temp (24hrs), Avg:97.6 F (36.4 C), Min:97.4 F (36.3 C), Max:97.9 F (36.6 C)  Recent Labs  Lab 06/03/18 1155 06/03/18 1424  06/05/18 0426 06/05/18 0545 06/06/18 0324 06/06/18 0735 06/07/18 0326 06/07/18 0730 06/07/18 1030 06/07/18 1102 06/08/18 0434 06/09/18 0415  WBC  --   --    < > 10.3  --  14.2*  --  14.7*  --   --   --  12.5* 11.4*  CREATININE  --   --    < >  --  2.00* 3.01*  --  3.15*  --   --   --  2.60* 2.41*  LATICACIDVEN 1.1 1.1  --   --   --   --   --   --  3.1* 1.5  --   --   --   VANCOTROUGH  --   --   --   --   --   --  16  --   --   --   --   --   --   VANCORANDOM  --   --   --   --   --   --   --   --   --   --  23  --  13   < > = values in this interval not displayed.    Estimated Creatinine Clearance: 28.4 mL/min (A) (by C-G formula based on SCr of 2.41 mg/dL (H)).    No Known Allergies  Thank you for allowing pharmacy to be a part of this patient's care.  Albertina Parr, PharmD., BCPS Clinical Pharmacist Clinical phone for 06/09/18 until 3:30pm: [phone] If after 3:30pm, please refer to [facility] for unit-specific pharmacist

## 2018-06-10 ENCOUNTER — Inpatient Hospital Stay (HOSPITAL_COMMUNITY): Payer: Medicaid Other

## 2018-06-10 LAB — GLUCOSE, CAPILLARY
Glucose-Capillary: 105 mg/dL — ABNORMAL HIGH (ref 70–99)
Glucose-Capillary: 118 mg/dL — ABNORMAL HIGH (ref 70–99)
Glucose-Capillary: 113 mg/dL — ABNORMAL HIGH (ref 70–99)
Glucose-Capillary: 119 mg/dL — ABNORMAL HIGH (ref 70–99)
Glucose-Capillary: 117 mg/dL — ABNORMAL HIGH (ref 70–99)
Glucose-Capillary: 104 mg/dL — ABNORMAL HIGH (ref 70–99)

## 2018-06-10 LAB — BASIC METABOLIC PANEL
Anion gap: 8 (ref 5–15)
BUN: 36 mg/dL — ABNORMAL HIGH (ref 6–20)
CO2: 25 mmol/L (ref 22–32)
Calcium: 7.5 mg/dL — ABNORMAL LOW (ref 8.9–10.3)
Chloride: 117 mmol/L — ABNORMAL HIGH (ref 98–111)
Creatinine, Ser: 2.37 mg/dL — ABNORMAL HIGH (ref 0.61–1.24)
GFR calc Af Amer: 33 mL/min — ABNORMAL LOW (ref >60)
GFR calc non Af Amer: 29 mL/min — ABNORMAL LOW (ref >60)
Glucose, Bld: 105 mg/dL — ABNORMAL HIGH (ref 70–99)
Potassium: 3.2 mmol/L — ABNORMAL LOW (ref 3.5–5.1)
Sodium: 150 mmol/L — ABNORMAL HIGH (ref 135–145)

## 2018-06-10 LAB — GLUCOSE, BODY FLUID OTHER: GLUCOSE, BODY FLUID OTHER: 149 mg/dL

## 2018-06-10 LAB — TRIGLYCERIDES, BODY FLUIDS: Triglycerides, Fluid: 362 mg/dL

## 2018-06-10 LAB — COOXEMETRY PANEL
Carboxyhemoglobin: 1.3 % (ref 0.5–1.5)
Methemoglobin: 0.8 % (ref 0.0–1.5)
O2 Saturation: 66.6 %
Total hemoglobin: 10.9 g/dL — ABNORMAL LOW (ref 12.0–16.0)

## 2018-06-10 LAB — CBC
HCT: 28.8 % — ABNORMAL LOW (ref 39.0–52.0)
Hemoglobin: 9.2 g/dL — ABNORMAL LOW (ref 13.0–17.0)
MCH: 29.6 pg (ref 26.0–34.0)
MCHC: 31.9 g/dL (ref 30.0–36.0)
MCV: 92.6 fL (ref 80.0–100.0)
Platelets: 356 10*3/uL (ref 150–400)
RBC: 3.11 MIL/uL — ABNORMAL LOW (ref 4.22–5.81)
RDW: 14.4 % (ref 11.5–15.5)
WBC: 11.3 10*3/uL — ABNORMAL HIGH (ref 4.0–10.5)
nRBC: 0 % (ref 0.0–0.2)

## 2018-06-10 LAB — LACTATE DEHYDROGENASE: LDH: 192 U/L (ref 98–192)

## 2018-06-10 LAB — PROTEIN, TOTAL: Total Protein: 5 g/dL — ABNORMAL LOW (ref 6.5–8.1)

## 2018-06-10 MED ORDER — METOPROLOL TARTRATE 12.5 MG HALF TABLET
12.5000 mg | ORAL_TABLET | Freq: Two times a day (BID) | ORAL | Status: DC
  Administered 2018-06-10 – 2018-06-11 (×4): 12.5 mg via JEJUNOSTOMY
  Filled 2018-06-10 (×4): qty 1

## 2018-06-10 MED ORDER — POTASSIUM CHLORIDE 20 MEQ/15ML (10%) PO SOLN
40.0000 meq | ORAL | Status: AC
  Administered 2018-06-10 (×2): 40 meq
  Filled 2018-06-10 (×2): qty 30

## 2018-06-10 NOTE — Progress Notes (Signed)
[facility] Progress Note Patient Name: Aaron Wang DOB: 1955/11/28 MRN: [id number]   Date of Service  06/10/2018  HPI/Events of Note  Hypokalemia  eICU Interventions  Potassium replaced     Intervention Category Intermediate Interventions: Electrolyte abnormality - evaluation and management  Perina Salvaggio 06/10/2018, 5:34 AM

## 2018-06-10 NOTE — Progress Notes (Signed)
Patient ID: Aaron Wang, male   DOB: 1955-10-14, 63 y.o.   MRN: [id number] EVENING ROUNDS NOTE :     [place]       [place] [address]             [phone]                 12 Days Post-Op Procedure(s) (LRB): TRANSHIATAL TOTAL ESOPHAGECTOMY (N/A) PYLOROMYOTOMY (N/A) FEEDING JEJUNOSTOMY (N/A) CERVICOESOPHAGO GASTROSTOMY (Right) Flexible Bronchoscopy LEFT CHEST TUBE INSERTION (Left)  Total Length of Stay:  LOS: 12 days  BP (!) 162/90   Pulse (!) 117   Temp 98.2 F (36.8 C) (Oral)   Resp (!) 31   Ht _0  (1.702 m)   Wt 61.7 kg   SpO2 94%   BMI 21.30 kg/m   .Intake/Output      02/29 0701 - 03/01 0700   P.O. 360   I.V. (mL/kg) 373.1 (6)   NG/GT 1595   IV Piggyback 75.8   Total Intake(mL/kg) 2403.9 (39)   Urine (mL/kg/hr) 320 (0.4)   Other    Stool    Total Output 320   Net +2083.9       Urine Occurrence 3 x     . sodium chloride 10 mL/hr at 06/10/18 0700  . famotidine (PEPCID) IV 20 mg (06/10/18 1006)  . feeding supplement (OSMOLITE 1.2 CAL) 1,000 mL (06/10/18 1008)  . niCARDipine 5 mg/hr (06/10/18 1900)  . piperacillin-tazobactam (ZOSYN)  IV 3.375 g (06/10/18 1500)     Lab Results  Component Value Date   WBC 11.3 (H) 06/10/2018   HGB 9.2 (L) 06/10/2018   HCT 28.8 (L) 06/10/2018   PLT 356 06/10/2018   GLUCOSE 105 (H) 06/10/2018   LDLDIRECT 112 (H) 11/26/2009   ALT 25 06/07/2018   AST 30 06/07/2018   NA 150 (H) 06/10/2018   K 3.2 (L) 06/10/2018   CL 117 (H) 06/10/2018   CREATININE 2.37 (H) 06/10/2018   BUN 36 (H) 06/10/2018   CO2 25 06/10/2018   TSH 0.925 11/26/2009   PSA 0.63 04/16/2010   INR 1.10 05/25/2018   MICROALBUR 0.50 01/01/2010   Stable day bp still hard to control    [name] MD  Beeper [phone] Office [phone] 06/10/2018 7:30 PM

## 2018-06-10 NOTE — Progress Notes (Signed)
Patient ID: Aaron Wang, male   DOB: 1956/03/18, 63 y.o.   MRN: [id number] TCTS DAILY ICU PROGRESS NOTE                   [address] [place] [address]            [place], [address]          [phone]   12 Days Post-Op Procedure(s) (LRB): TRANSHIATAL TOTAL ESOPHAGECTOMY (N/A) PYLOROMYOTOMY (N/A) FEEDING JEJUNOSTOMY (N/A) CERVICOESOPHAGO GASTROSTOMY (Right) Flexible Bronchoscopy LEFT CHEST TUBE INSERTION (Left)  Total Length of Stay:  LOS: 12 days   Subjective: Overall patient feels better, respiratory status improved, still blood pressures difficult to control  Objective: Vital signs in last 24 hours: Temp:  [97.6 F (36.4 C)-98.8 F (37.1 C)] 97.9 F (36.6 C) (02/29 0400) Pulse Rate:  [86-141] 125 (02/29 0700) Cardiac Rhythm: Sinus tachycardia (02/29 0400) Resp:  [19-37] 32 (02/29 0700) BP: (123-173)/(72-117) 158/87 (02/29 0700) SpO2:  [87 %-100 %] 89 % (02/29 0700)  Filed Weights   06/01/18 0600 06/05/18 0515 06/08/18 0615  Weight: 60.7 kg 59.8 kg 61.7 kg    Weight change:    Hemodynamic parameters for last 24 hours:    Intake/Output from previous day: 02/28 0701 - 02/29 0700 In: 2598.6 [I.V.:1193; NG/GT:1008.6; IV Piggyback:397] Out: 3750 [Urine:1250]  Intake/Output this shift: No intake/output data recorded.  Current Meds: Scheduled Meds: . chlorhexidine gluconate (MEDLINE KIT)  15 mL Mouth Rinse BID  . Chlorhexidine Gluconate Cloth  6 each Topical Daily  . cloNIDine  0.1 mg Transdermal Weekly  . folic acid  1 mg Intravenous Daily  . free water  100 mL Per Tube Q8H  . insulin aspart  0-24 Units Subcutaneous Q4H  . pantoprazole (PROTONIX) IV  40 mg Intravenous Q12H  . potassium chloride  40 mEq Per Tube Q4H  . sodium chloride flush  10-40 mL Intracatheter Q12H  . thiamine injection  100 mg Intravenous Daily  . vancomycin variable dose per unstable renal function (pharmacist dosing)   Does not apply See admin instructions   Continuous  Infusions: . sodium chloride 10 mL/hr at 06/10/18 0700  . famotidine (PEPCID) IV 20 mg (06/09/18 0949)  . feeding supplement (OSMOLITE 1.2 CAL) 65 mL/hr at 06/10/18 0700  . niCARDipine 10 mg/hr (06/10/18 0700)  . piperacillin-tazobactam (ZOSYN)  IV 12.5 mL/hr at 06/10/18 0700   PRN Meds:.sodium chloride, oxyCODONE **AND** acetaminophen, fentaNYL (SUBLIMAZE) injection, hydrALAZINE, labetalol, ondansetron (ZOFRAN) IV, sodium chloride flush  General appearance: alert, cooperative and no distress Neurologic: intact Heart: regular rate and rhythm, S1, S2 normal, no murmur, click, rub or gallop Lungs: diminished breath sounds bibasilar Abdomen: soft, non-tender; bowel sounds normal; no masses,  no organomegaly Extremities: extremities normal, atraumatic, no cyanosis or edema and Homans sign is negative, no sign of DVT Wound: no drainage from neck, taking ice chips   Lab Results: CBC: Recent Labs    06/09/18 0415 06/10/18 0245  WBC 11.4* 11.3*  HGB 9.6* 9.2*  HCT 29.3* 28.8*  PLT 328 356   BMET:  Recent Labs    06/09/18 0415 06/10/18 0245  NA 150* 150*  K 2.8* 3.2*  CL 118* 117*  CO2 25 25  GLUCOSE 119* 105*  BUN 34* 36*  CREATININE 2.41* 2.37*  CALCIUM 7.3* 7.5*    CMET: Lab Results  Component Value Date   WBC 11.3 (H) 06/10/2018   HGB 9.2 (L) 06/10/2018   HCT 28.8 (L) 06/10/2018   PLT 356 06/10/2018  GLUCOSE 105 (H) 06/10/2018   LDLDIRECT 112 (H) 11/26/2009   ALT 25 06/07/2018   AST 30 06/07/2018   NA 150 (H) 06/10/2018   K 3.2 (L) 06/10/2018   CL 117 (H) 06/10/2018   CREATININE 2.37 (H) 06/10/2018   BUN 36 (H) 06/10/2018   CO2 25 06/10/2018   TSH 0.925 11/26/2009   PSA 0.63 04/16/2010   INR 1.10 05/25/2018   MICROALBUR 0.50 01/01/2010      PT/INR: No results for input(s): LABPROT, INR in the last 72 hours. Radiology: Dg Chest Port 1 View  Result Date: 06/09/2018 CLINICAL DATA:  Post left-sided thoracentesis EXAM: PORTABLE CHEST 1 VIEW COMPARISON:   06/09/2018 FINDINGS: Grossly unchanged cardiac silhouette and mediastinal contours. Stable position of support apparatus. Epicardial pacer leads again overlie the cardiac apex. Interval reduction/near resolution of persistent trace left-sided effusion post thoracentesis. No pneumothorax. Unchanged small layering right-sided pleural effusion. Improved aeration of left lung with persistent left basilar opacities. Pulmonary vasculature remains indistinct within the right lung. No new focal airspace opacities. No acute osseous abnormalities. IMPRESSION: 1. Interval reduction/near resolution of persistent trace left-sided effusion post thoracentesis. No pneumothorax. 2. Improved aeration of the left lung with persistent left basilar opacities, likely atelectasis. 3. Suspected residual asymmetric pulmonary edema within the right lung with associated small right-sided effusion. Electronically Signed   By: Sandi Mariscal M.D.   On: 06/09/2018 13:05   US Thoracentesis Asp Pleural Space W/img Guide  Result Date: 06/09/2018 INDICATION: Status post esophagectomy 05/29/2018. Now with shortness of breath secondary to left pleural effusion. Request for diagnostic and therapeutic thoracentesis. EXAM: ULTRASOUND GUIDED LEFT THORACENTESIS MEDICATIONS: 1% lidocaine 10 mL COMPLICATIONS: None immediate. PROCEDURE: An ultrasound guided thoracentesis was thoroughly discussed with the patient and questions answered. The benefits, risks, alternatives and complications were also discussed. The patient understands and wishes to proceed with the procedure. Written consent was obtained. Ultrasound was performed to localize and mark an adequate pocket of fluid in the left chest. The area was then prepped and draped in the normal sterile fashion. 1% Lidocaine was used for local anesthesia. Under ultrasound guidance a 6 Fr Safe-T-Centesis catheter was introduced. Thoracentesis was performed. The catheter was removed and a dressing applied.  FINDINGS: A total of approximately 1.8 L of milky yellow fluid was removed. Samples were sent to the laboratory as requested by the clinical team. IMPRESSION: Successful ultrasound guided left thoracentesis yielding 1.8 L of pleural fluid. No pneumothorax on post-procedure chest x-ray. Read by: Gareth Eagle, PA-C Electronically Signed   By: Lucrezia Europe M.D.   On: 06/09/2018 13:05     Assessment/Plan: S/P Procedure(s) (LRB): TRANSHIATAL TOTAL ESOPHAGECTOMY (N/A) PYLOROMYOTOMY (N/A) FEEDING JEJUNOSTOMY (N/A) CERVICOESOPHAGO GASTROSTOMY (Right) Flexible Bronchoscopy LEFT CHEST TUBE INSERTION (Left) Mobilize Swallow Monday Transition  Off cardene drip to catapress . Triglycerides on body fluid still pending    [name] 06/10/2018 8:53 AM

## 2018-06-10 NOTE — Plan of Care (Signed)
Patient progressing started today on small amount of metroprolol. No signs of vagal, or bradycardia. Weaning off cardene. Walked 190 feet. Using accessory muscles abd for breathing, still dyspneic, but feels improved after thoracentesis. Needs to build up muscles. Periods of work with periods of rest, weaned down to 6 LPM High flow.

## 2018-06-11 ENCOUNTER — Inpatient Hospital Stay (HOSPITAL_COMMUNITY): Payer: Medicaid Other

## 2018-06-11 LAB — GLUCOSE, CAPILLARY
Glucose-Capillary: 130 mg/dL — ABNORMAL HIGH (ref 70–99)
Glucose-Capillary: 128 mg/dL — ABNORMAL HIGH (ref 70–99)
Glucose-Capillary: 123 mg/dL — ABNORMAL HIGH (ref 70–99)
Glucose-Capillary: 120 mg/dL — ABNORMAL HIGH (ref 70–99)
Glucose-Capillary: 125 mg/dL — ABNORMAL HIGH (ref 70–99)
Glucose-Capillary: 108 mg/dL — ABNORMAL HIGH (ref 70–99)

## 2018-06-11 LAB — CBC
HCT: 31.3 % — ABNORMAL LOW (ref 39.0–52.0)
Hemoglobin: 10 g/dL — ABNORMAL LOW (ref 13.0–17.0)
MCH: 29.5 pg (ref 26.0–34.0)
MCHC: 31.9 g/dL (ref 30.0–36.0)
MCV: 92.3 fL (ref 80.0–100.0)
Platelets: 418 10*3/uL — ABNORMAL HIGH (ref 150–400)
RBC: 3.39 MIL/uL — ABNORMAL LOW (ref 4.22–5.81)
RDW: 14.6 % (ref 11.5–15.5)
WBC: 12.6 10*3/uL — ABNORMAL HIGH (ref 4.0–10.5)
nRBC: 0 % (ref 0.0–0.2)

## 2018-06-11 LAB — COMPREHENSIVE METABOLIC PANEL
ALT: 18 U/L (ref 0–44)
AST: 20 U/L (ref 15–41)
Albumin: 2.4 g/dL — ABNORMAL LOW (ref 3.5–5.0)
Alkaline Phosphatase: 56 U/L (ref 38–126)
Anion gap: 9 (ref 5–15)
BILIRUBIN TOTAL: 0.2 mg/dL — AB (ref 0.3–1.2)
BUN: 39 mg/dL — ABNORMAL HIGH (ref 6–20)
CO2: 22 mmol/L (ref 22–32)
Calcium: 7.6 mg/dL — ABNORMAL LOW (ref 8.9–10.3)
Chloride: 118 mmol/L — ABNORMAL HIGH (ref 98–111)
Creatinine, Ser: 2.42 mg/dL — ABNORMAL HIGH (ref 0.61–1.24)
GFR, EST AFRICAN AMERICAN: 32 mL/min — AB (ref >60)
GFR, EST NON AFRICAN AMERICAN: 28 mL/min — AB (ref >60)
Glucose, Bld: 139 mg/dL — ABNORMAL HIGH (ref 70–99)
Potassium: 3.8 mmol/L (ref 3.5–5.1)
Sodium: 149 mmol/L — ABNORMAL HIGH (ref 135–145)
Total Protein: 5.3 g/dL — ABNORMAL LOW (ref 6.5–8.1)

## 2018-06-11 LAB — LIPID PANEL
Cholesterol: 94 mg/dL (ref 0–200)
HDL: 27 mg/dL — ABNORMAL LOW (ref >40)
LDL Cholesterol: 56 mg/dL (ref 0–99)
Total CHOL/HDL Ratio: 3.5 RATIO
Triglycerides: 55 mg/dL (ref <150)
VLDL: 11 mg/dL (ref 0–40)

## 2018-06-11 MED ORDER — FUROSEMIDE 10 MG/ML IJ SOLN
40.0000 mg | Freq: Once | INTRAMUSCULAR | Status: AC
  Administered 2018-06-11: 40 mg via INTRAVENOUS
  Filled 2018-06-11: qty 4

## 2018-06-11 NOTE — Progress Notes (Signed)
Pt had a 10 secound pause roughly 2hrs after giving IV labetalol. Pt regained consciousness quickly, no medication or cpr needed. However pads were placed on pt and zoll in room. Pt reported "not feeling well" prior to the event. Vitals signs have remained stable since that brief episode.

## 2018-06-11 NOTE — Progress Notes (Signed)
Patient ID: CANNAN BEECK, male   DOB: [birth year], 63 y.o.   MRN: [id number] TCTS DAILY ICU PROGRESS NOTE                   [address]            [place] [address]          [phone]   13 Days Post-Op Procedure(s) (LRB): TRANSHIATAL TOTAL ESOPHAGECTOMY (N/A) PYLOROMYOTOMY (N/A) FEEDING JEJUNOSTOMY (N/A) CERVICOESOPHAGO GASTROSTOMY (Right) Flexible Bronchoscopy LEFT CHEST TUBE INSERTION (Left)  Total Length of Stay:  LOS: 13 days   Subjective: alert this am,  o2 sat 95 on 4-6 l [place], episode of bradycardia after iv labetalol given during the night  , now hr 114  Objective: Vital signs in last 24 hours: Temp:  [97.7 F (36.5 C)-98.2 F (36.8 C)] 98 F (36.7 C) (03/01 0838) Pulse Rate:  [91-129] 114 (03/01 0700) Cardiac Rhythm: Sinus tachycardia (02/29 2000) Resp:  [22-45] 32 (03/01 0700) BP: (143-188)/(80-120) 156/83 (03/01 0700) SpO2:  [88 %-99 %] 91 % (03/01 0700)  Filed Weights   06/01/18 0600 06/05/18 0515 06/08/18 0615  Weight: 60.7 kg 59.8 kg 61.7 kg    Weight change:    Hemodynamic parameters for last 24 hours:    Intake/Output from previous day: 02/29 0701 - 03/01 0700 In: 2834.4 [P.O.:360; I.V.:803.5; NG/GT:1595; IV Piggyback:75.8] Out: 670 [Urine:670]  Intake/Output this shift: No intake/output data recorded.  Current Meds: Scheduled Meds: . chlorhexidine gluconate (MEDLINE KIT)  15 mL Mouth Rinse BID  . Chlorhexidine Gluconate Cloth  6 each Topical Daily  . cloNIDine  0.1 mg Transdermal Weekly  . folic acid  1 mg Intravenous Daily  . free water  100 mL Per Tube Q8H  . insulin aspart  0-24 Units Subcutaneous Q4H  . metoprolol tartrate  12.5 mg Per J Tube BID  . pantoprazole (PROTONIX) IV  40 mg Intravenous Q12H  . sodium chloride flush  10-40 mL Intracatheter Q12H  . thiamine injection  100 mg Intravenous Daily   Continuous Infusions: . sodium chloride 10 mL/hr at 06/11/18 0700  . famotidine (PEPCID) IV 20 mg (06/10/18 1006)   . feeding supplement (OSMOLITE 1.2 CAL) 1,000 mL (06/10/18 2256)  . niCARDipine 7.5 mg/hr (06/11/18 0700)   PRN Meds:.sodium chloride, oxyCODONE **AND** acetaminophen, hydrALAZINE, ondansetron (ZOFRAN) IV, sodium chloride flush  General appearance: alert, cooperative and no distress Neurologic: intact Heart: regular rate and rhythm, S1, S2 normal, no murmur, click, rub or gallop Lungs: diminished breath sounds bibasilar Abdomen: soft, non-tender; bowel sounds normal; no masses,  no organomegaly Extremities: extremities normal, atraumatic, no cyanosis or edema and Homans sign is negative, no sign of DVT Wound: no neck wound/penrose drainage with sips of water  Lab Results: CBC: Recent Labs    06/10/18 0245 06/11/18 0305  WBC 11.3* 12.6*  HGB 9.2* 10.0*  HCT 28.8* 31.3*  PLT 356 418*   BMET:  Recent Labs    06/10/18 0245 06/11/18 0305  NA 150* 149*  K 3.2* 3.8  CL 117* 118*  CO2 25 22  GLUCOSE 105* 139*  BUN 36* 39*  CREATININE 2.37* 2.42*  CALCIUM 7.5* 7.6*    CMET: Lab Results  Component Value Date   WBC 12.6 (H) 06/11/2018   HGB 10.0 (L) 06/11/2018   HCT 31.3 (L) 06/11/2018   PLT 418 (H) 06/11/2018   GLUCOSE 139 (H) 06/11/2018   CHOL 94 06/11/2018   TRIG 55 06/11/2018   HDL  27 (L) 06/11/2018   LDLDIRECT 112 (H) 11/26/2009   LDLCALC 56 06/11/2018   ALT 18 06/11/2018   AST 20 06/11/2018   NA 149 (H) 06/11/2018   K 3.8 06/11/2018   CL 118 (H) 06/11/2018   CREATININE 2.42 (H) 06/11/2018   BUN 39 (H) 06/11/2018   CO2 22 06/11/2018   TSH 0.925 11/26/2009   PSA 0.63 04/16/2010   INR 1.10 05/25/2018   MICROALBUR 0.50 01/01/2010      PT/INR: No results for input(s): LABPROT, INR in the last 72 hours. 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 Chest tube in place. EXAM: PORTABLE CHEST 1 VIEW COMPARISON:  06/10/2018 FINDINGS: Right arm PICC line remains in appropriate  position. No pneumothorax visualized. Diffuse interstitial edema pattern appear stable. Decreased lung volumes are seen with increased atelectasis or infiltrate in both lung bases. Small bilateral pleural effusions show no significant change. Heart size is stable. IMPRESSION: 1. Decreased lung volumes with increased bibasilar atelectasis versus infiltrates. 2. Stable diffuse interstitial edema pattern and small bilateral pleural effusions. Electronically Signed   By: Earle Gell M.D.   On: 06/11/2018 09:05     Assessment/Plan: S/P Procedure(s) (LRB): TRANSHIATAL TOTAL ESOPHAGECTOMY (N/A) PYLOROMYOTOMY (N/A) FEEDING JEJUNOSTOMY (N/A) CERVICOESOPHAGO GASTROSTOMY (Right) Flexible Bronchoscopy LEFT CHEST TUBE INSERTION (Left) Mobilize Diuresis Swallow evaluation in am with esophagram  transition o catapres and lopressor oral/j tube for bp control Will need evaluation of renal arteries in future   Triglycerides elevated to 362 in pleural fluid with serum 55 - monitor for chylous thorax,current xray stable without increasing effusion , have not checked chylomicrons in pleural fluid    [name] 06/11/2018 9:17 AM

## 2018-06-11 NOTE — Plan of Care (Signed)
Progressing with o2 dial down, HFNC at 4 LPM. BP still labile, giving PRN apresoline. On catapres patch. Going for swallow evaluation in AM> tolerating ice chips well.

## 2018-06-11 NOTE — Progress Notes (Signed)
EVENING ROUNDS NOTE :     [address]       ,[place] [address]             [phone]                 13 Days Post-Op Procedure(s) (LRB): TRANSHIATAL TOTAL ESOPHAGECTOMY (N/A) PYLOROMYOTOMY (N/A) FEEDING JEJUNOSTOMY (N/A) CERVICOESOPHAGO GASTROSTOMY (Right) Flexible Bronchoscopy LEFT CHEST TUBE INSERTION (Left)  Total Length of Stay:  LOS: 13 days  BP (!) 175/102   Pulse 92   Temp 97.9 F (36.6 C) (Oral)   Resp (!) 25   Ht _0  (1.702 m)   Wt 61.7 kg   SpO2 99%   BMI 21.30 kg/m   .Intake/Output      02/29 0701 - 03/01 0700 03/01 0701 - 03/02 0700   P.O. 360 250   I.V. (mL/kg) 803.5 (13) 79.3 (1.3)   NG/GT 1595 1595   IV Piggyback 75.8    Total Intake(mL/kg) 2834.4 (45.9) 1924.3 (31.2)   Urine (mL/kg/hr) 670 (0.5) 500 (0.7)   Other     Stool 0    Total Output 670 500   Net +2164.4 +1424.3        Urine Occurrence 4 x    Stool Occurrence 1 x      . sodium chloride 10 mL/hr at 06/11/18 0700  . famotidine (PEPCID) IV 20 mg (06/11/18 0918)  . feeding supplement (OSMOLITE 1.2 CAL) 1,000 mL (06/10/18 2256)  . niCARDipine Stopped (06/11/18 0925)     Lab Results  Component Value Date   WBC 12.6 (H) 06/11/2018   HGB 10.0 (L) 06/11/2018   HCT 31.3 (L) 06/11/2018   PLT 418 (H) 06/11/2018   GLUCOSE 139 (H) 06/11/2018   CHOL 94 06/11/2018   TRIG 55 06/11/2018   HDL 27 (L) 06/11/2018   LDLDIRECT 112 (H) 11/26/2009   LDLCALC 56 06/11/2018   ALT 18 06/11/2018   AST 20 06/11/2018   NA 149 (H) 06/11/2018   K 3.8 06/11/2018   CL 118 (H) 06/11/2018   CREATININE 2.42 (H) 06/11/2018   BUN 39 (H) 06/11/2018   CO2 22 06/11/2018   TSH 0.925 11/26/2009   PSA 0.63 04/16/2010   INR 1.10 05/25/2018   MICROALBUR 0.50 01/01/2010   Oxygenation improved,  Still difficulty to control bp    [name] MD  Beeper [phone] Office [phone] 06/11/2018 6:54 PM

## 2018-06-12 ENCOUNTER — Inpatient Hospital Stay (HOSPITAL_COMMUNITY): Payer: Medicaid Other

## 2018-06-12 DIAGNOSIS — J9601 Acute respiratory failure with hypoxia: Secondary | ICD-10-CM

## 2018-06-12 DIAGNOSIS — I9789 Other postprocedural complications and disorders of the circulatory system, not elsewhere classified: Secondary | ICD-10-CM

## 2018-06-12 LAB — CULTURE, BLOOD (ROUTINE X 2)
Culture: NO GROWTH
Culture: NO GROWTH
Special Requests: ADEQUATE
Special Requests: ADEQUATE

## 2018-06-12 LAB — CBC
HCT: 29.5 % — ABNORMAL LOW (ref 39.0–52.0)
Hemoglobin: 9.3 g/dL — ABNORMAL LOW (ref 13.0–17.0)
MCH: 29.5 pg (ref 26.0–34.0)
MCHC: 31.5 g/dL (ref 30.0–36.0)
MCV: 93.7 fL (ref 80.0–100.0)
Platelets: 393 10*3/uL (ref 150–400)
RBC: 3.15 MIL/uL — ABNORMAL LOW (ref 4.22–5.81)
RDW: 14.7 % (ref 11.5–15.5)
WBC: 10.9 10*3/uL — ABNORMAL HIGH (ref 4.0–10.5)
nRBC: 0 % (ref 0.0–0.2)

## 2018-06-12 LAB — BASIC METABOLIC PANEL
Anion gap: 7 (ref 5–15)
BUN: 39 mg/dL — ABNORMAL HIGH (ref 6–20)
CO2: 28 mmol/L (ref 22–32)
Calcium: 8 mg/dL — ABNORMAL LOW (ref 8.9–10.3)
Chloride: 112 mmol/L — ABNORMAL HIGH (ref 98–111)
Creatinine, Ser: 2.64 mg/dL — ABNORMAL HIGH (ref 0.61–1.24)
GFR calc Af Amer: 29 mL/min — ABNORMAL LOW (ref >60)
GFR calc non Af Amer: 25 mL/min — ABNORMAL LOW (ref >60)
Glucose, Bld: 109 mg/dL — ABNORMAL HIGH (ref 70–99)
Potassium: 4 mmol/L (ref 3.5–5.1)
Sodium: 147 mmol/L — ABNORMAL HIGH (ref 135–145)

## 2018-06-12 LAB — GLUCOSE, CAPILLARY
Glucose-Capillary: 100 mg/dL — ABNORMAL HIGH (ref 70–99)
Glucose-Capillary: 108 mg/dL — ABNORMAL HIGH (ref 70–99)
Glucose-Capillary: 109 mg/dL — ABNORMAL HIGH (ref 70–99)
Glucose-Capillary: 130 mg/dL — ABNORMAL HIGH (ref 70–99)
Glucose-Capillary: 98 mg/dL (ref 70–99)

## 2018-06-12 MED ORDER — CHLORHEXIDINE GLUCONATE 0.12 % MT SOLN
OROMUCOSAL | Status: AC
  Administered 2018-06-12: 15 mL via OROMUCOSAL
  Filled 2018-06-12: qty 15

## 2018-06-12 MED ORDER — CARVEDILOL 3.125 MG PO TABS
3.1250 mg | ORAL_TABLET | Freq: Two times a day (BID) | ORAL | Status: DC
  Administered 2018-06-12 – 2018-06-13 (×3): 3.125 mg via JEJUNOSTOMY
  Filled 2018-06-12 (×3): qty 1

## 2018-06-12 MED ORDER — DIPHENOXYLATE-ATROPINE 2.5-0.025 MG/5ML PO LIQD
5.0000 mL | ORAL | Status: DC | PRN
  Administered 2018-06-12 – 2018-06-13 (×2): 5 mL
  Filled 2018-06-12 (×2): qty 5

## 2018-06-12 MED ORDER — FAMOTIDINE 20 MG PO TABS
20.0000 mg | ORAL_TABLET | Freq: Every day | ORAL | Status: DC
  Administered 2018-06-12 – 2018-06-13 (×2): 20 mg
  Filled 2018-06-12 (×2): qty 1

## 2018-06-12 MED ORDER — FUROSEMIDE 10 MG/ML IJ SOLN
40.0000 mg | Freq: Once | INTRAMUSCULAR | Status: AC
  Administered 2018-06-12: 40 mg via INTRAVENOUS
  Filled 2018-06-12: qty 4

## 2018-06-12 MED ORDER — AMLODIPINE BESYLATE 5 MG PO TABS
5.0000 mg | ORAL_TABLET | Freq: Every day | ORAL | Status: DC
  Administered 2018-06-12 – 2018-06-13 (×2): 5 mg via JEJUNOSTOMY
  Filled 2018-06-12 (×2): qty 1

## 2018-06-12 MED ORDER — IOHEXOL 300 MG/ML  SOLN
150.0000 mL | Freq: Once | INTRAMUSCULAR | Status: AC | PRN
  Administered 2018-06-12: 75 mL via ORAL

## 2018-06-12 NOTE — Progress Notes (Signed)
Patient ID: Aaron Wang, male   DOB: [birth year], 63 y.o.   MRN: [id number] TCTS DAILY ICU PROGRESS NOTE                   [address]            [place] [address]          [phone]   14 Days Post-Op Procedure(s) (LRB): TRANSHIATAL TOTAL ESOPHAGECTOMY (N/A) PYLOROMYOTOMY (N/A) FEEDING JEJUNOSTOMY (N/A) CERVICOESOPHAGO GASTROSTOMY (Right) Flexible Bronchoscopy LEFT CHEST TUBE INSERTION (Left)  Total Length of Stay:  LOS: 14 days   Subjective: Went for swallow study this am, one episode of bradycardia during night .  Objective: Vital signs in last 24 hours: Temp:  [97.2 F (36.2 C)-98.5 F (36.9 C)] 98.5 F (36.9 C) (03/02 0400) Pulse Rate:  [82-107] 89 (03/02 0900) Cardiac Rhythm: Sinus tachycardia (03/02 0800) Resp:  [16-27] 19 (03/02 0900) BP: (151-185)/(87-114) 184/114 (03/02 0900) SpO2:  [89 %-100 %] 97 % (03/02 0900)  Filed Weights   06/01/18 0600 06/05/18 0515 06/08/18 0615  Weight: 60.7 kg 59.8 kg 61.7 kg    Weight change:    Hemodynamic parameters for last 24 hours:    Intake/Output from previous day: 03/01 0701 - 03/02 0700 In: 1924.3 [P.O.:250; I.V.:79.3; NG/GT:1595] Out: 925 [Urine:925]  Intake/Output this shift: No intake/output data recorded.  Current Meds: Scheduled Meds: . chlorhexidine gluconate (MEDLINE KIT)  15 mL Mouth Rinse BID  . Chlorhexidine Gluconate Cloth  6 each Topical Daily  . cloNIDine  0.1 mg Transdermal Weekly  . folic acid  1 mg Intravenous Daily  . free water  100 mL Per Tube Q8H  . insulin aspart  0-24 Units Subcutaneous Q4H  . metoprolol tartrate  12.5 mg Per J Tube BID  . pantoprazole (PROTONIX) IV  40 mg Intravenous Q12H  . sodium chloride flush  10-40 mL Intracatheter Q12H  . thiamine injection  100 mg Intravenous Daily   Continuous Infusions: . sodium chloride 10 mL/hr at 06/11/18 0700  . famotidine (PEPCID) IV 20 mg (06/11/18 0918)  . feeding supplement (OSMOLITE 1.2 CAL) 1,000 mL (06/10/18  2256)  . niCARDipine Stopped (06/11/18 0925)   PRN Meds:.sodium chloride, oxyCODONE **AND** acetaminophen, hydrALAZINE, ondansetron (ZOFRAN) IV, sodium chloride flush  General appearance: alert, cooperative and no distress Neurologic: intact Heart: regular rate and rhythm, S1, S2 normal, no murmur, click, rub or gallop Lungs: diminished breath sounds bibasilar Abdomen: soft, non-tender; bowel sounds normal; no masses,  no organomegaly Extremities: extremities normal, atraumatic, no cyanosis or edema and Homans sign is negative, no sign of DVT Wound: intact  Lab Results: CBC: Recent Labs    06/11/18 0305 06/12/18 0358  WBC 12.6* 10.9*  HGB 10.0* 9.3*  HCT 31.3* 29.5*  PLT 418* 393   BMET:  Recent Labs    06/11/18 0305 06/12/18 0358  NA 149* 147*  K 3.8 4.0  CL 118* 112*  CO2 22 28  GLUCOSE 139* 109*  BUN 39* 39*  CREATININE 2.42* 2.64*  CALCIUM 7.6* 8.0*    CMET: Lab Results  Component Value Date   WBC 10.9 (H) 06/12/2018   HGB 9.3 (L) 06/12/2018   HCT 29.5 (L) 06/12/2018   PLT 393 06/12/2018   GLUCOSE 109 (H) 06/12/2018   CHOL 94 06/11/2018   TRIG 55 06/11/2018   HDL 27 (L) 06/11/2018   LDLDIRECT 112 (H) 11/26/2009   LDLCALC 56 06/11/2018   ALT 18 06/11/2018   AST 20 06/11/2018  NA 147 (H) 06/12/2018   K 4.0 06/12/2018   CL 112 (H) 06/12/2018   CREATININE 2.64 (H) 06/12/2018   BUN 39 (H) 06/12/2018   CO2 28 06/12/2018   TSH 0.925 11/26/2009   PSA 0.63 04/16/2010   INR 1.10 05/25/2018   MICROALBUR 0.50 01/01/2010      PT/INR: No results for input(s): LABPROT, INR in the last 72 hours. Radiology: Dg Chest Port 1 View  Result Date: 06/12/2018 CLINICAL DATA:  Chest tube in place. EXAM: PORTABLE CHEST 1 VIEW COMPARISON:  06/11/2018 FINDINGS: Right-sided PICC line unchanged. Lungs are adequately inflated demonstrate continued evidence of bilateral pleural effusions left greater than right without significant change. There is likely bibasilar  atelectasis. Slight worsening hazy perihilar interstitial prominence likely interstitial edema. Cardiomediastinal silhouette and remainder of the exam is unchanged. IMPRESSION: Findings suggesting worsening interstitial edema with small bilateral pleural effusions left greater than right likely with associated bibasilar atelectasis. Right-sided PICC line unchanged. Electronically Signed   By: Marin Olp M.D.   On: 06/12/2018 07:44   Dg Esophagus W Single Cm (sol Or Thin Ba)  Result Date: 06/12/2018 CLINICAL DATA:  Postop esophagectomy EXAM: ESOPHOGRAM/BARIUM SWALLOW TECHNIQUE: Single contrast examination was performed using  thin barium. FLUOROSCOPY TIME:  Fluoroscopy Time:  48 seconds Radiation Exposure Index (if provided by the fluoroscopic device): 11.8 mGy Number of Acquired Spot Images: 8 COMPARISON:  None. FINDINGS: Status post esophagectomy with gastric pull-through. Patent gastrojejunostomy. Anastomosis is mildly narrowed, although contrast freely flows into small bowel. No evidence of leak. IMPRESSION: Status post esophagectomy with gastric pull-through. Patent gastrojejunostomy. No evidence of leak. Electronically Signed   By: Julian Hy M.D.   On: 06/12/2018 08:30   I have independently reviewed the above radiology studies  and reviewed the findings with the patient.  Discussed radiographic findings with radiology, the patient does not have a gastrojejunostomy anastomosis is a cervical esophagogastrostomy, no leak is noted.  Patient had a pyloromyotomy which was misinterpreted as a gastrojejunostomy.   Assessment/Plan: S/P Procedure(s) (LRB): TRANSHIATAL TOTAL ESOPHAGECTOMY (N/A) PYLOROMYOTOMY (N/A) FEEDING JEJUNOSTOMY (N/A) CERVICOESOPHAGO GASTROSTOMY (Right) Flexible Bronchoscopy LEFT CHEST TUBE INSERTION (Left) Mobilize Diuresis Still difficult to control bp on lopressor and catapres patch , cr improved but still elevated  Vascular ultrasound of renal arteries to be  done Advance p.o. diet as tolerated One episode of bradycardia during the night, cardiology following      Aaron Wang 06/12/2018 9:54 AM

## 2018-06-12 NOTE — Progress Notes (Signed)
Down  And back to radiology for barium swallow  Via wheelchair with telemetry and o2. Tolerated procedure well. Dr gerhardt saw patient soon after return from procedure . Awaiting further orders.

## 2018-06-12 NOTE — Progress Notes (Signed)
NAME:  Aaron Wang, MRN:  [id number], DOB:  01-Nov-1955, LOS: 34 ADMISSION DATE:  05/29/2018, CONSULTATION DATE:  06/01/2018 REFERRING MD:  Cyndia Bent, CHIEF COMPLAINT:  Dyspnea   Brief History   63 year old male status post esophagectomy 2/17 in the setting of squamous cell carcinoma.  Elective bronchoscopy, transhiatal total esophagectomy with cervical esophagogastrostomy, pyloromyotomy, feeding jejunostomy tube, placement of left chest tube.  Postoperatively he initially did well but over the course of 48 hours from February 20 through February 22nd he had hypertension, increasing shortness of breath. Intubated for respiratory distress.  Past Medical History  Gastroesophageal reflux disease, hypertension, squamous cell carcinoma of the esophagus, carpal tunnel syndrome  Significant Hospital Events   2/17 admission, esophagogastrogastrostomy 2/25 BiPap at 300 am. 2x bradycardia with brief asystole, associated with bowel movement. Spontaneous ROSC. Transitioned to HFNC 2/26 Intermittent BiPAP use  2/27 No BiPAP overnight 2/28 O2 weaned to 12L, thora planned per IR   Consults:  PCCM    Procedures:  2/17 Esophagogastrogastrastostomy 2/22 PICC line 2/22 endotracheal tube 2/22 bronchoscopy: gastric secretions in trachea  Significant Diagnostic Tests:  June 03, 2018 echocardiogram LVEF 60 to 65%, mildly increased left ventricular wall thickness, impaired relaxation, RVSP 36, normal RV systolic function, pericardial effusion posterior to the left ventricle, mild to moderate tricuspid regurgitation, mild calcification of aortic valve, moderate left effusion  Micro Data:  BCx2 2/22 >>   BAL 2/22 >> negative BCx 2/26 >>> UA 2/26 >>> negative   Antimicrobials:  Vanco 2/22 >> 2/29 Zosyn 2/22 >> 2/29  Interim history/subjective:  Passed barium swallow this AM, episodic bradycardia overnight  Objective   Blood pressure (!) 184/114, pulse 89, temperature 98.1 F (36.7 C), temperature  source Oral, resp. rate 19, height _0  (1.702 m), weight 61.7 kg, SpO2 97 %.        Intake/Output Summary (Last 24 hours) at 06/12/2018 1204 Last data filed at 06/12/2018 0200 Gross per 24 hour  Intake 1845 ml  Output 925 ml  Net 920 ml   Filed Weights   06/01/18 0600 06/05/18 0515 06/08/18 0615  Weight: 60.7 kg 59.8 kg 61.7 kg   Examination: General: Well appearing, NAD HEENT: [place]/AT, PERRL, EOM-I and MMM Neuro: Alert and interactive, moving all ext to command CV: RRR, Nl S1/S2 and -M/R/G PULM: Decrease BS at the bases GI: Soft, NT, ND and +BS Extremities: -edema and -tenderness Skin: no rashes or lesions  I reviewed CXR myself, infiltrate noted  Resolved Hospital Problem list     Assessment & Plan:  Discussed with PCCM-NP  Acute respiratory failure with hypoxemia -initial concern for possible aspiration pneumonitis vs HAP.  FOB concerning for aspiration.  -interstitial edema, effusion on CXR 2/27 P: D/C HFNC Start standard [place] at 2L and titrate O2 for sat of 88-92% Will likely need an ambulatory desaturation study prior to discharge for ?of home O2 D/C Abx, course complete F/U CXR to PRN at this point Thora done by IR on 2/28, defer to CVTS Pulmonary hygiene-IS, mobilize   Anxiety -Possible EtOH withdrawal, consumes at least 1 40oz/day at home x several years P CIWA protocol  Folate, thiamine IV  Hypertension P: Defer BP regimen to Cardiology  Cardene gtt > consider transition to oral calcium channel blocker  Lopressor per tube > note significant issues with brady / pauses  Hold home ACE-I  Acute Kidney Injury Hypernatremia  P: Free water 100 ml/Q8 Renal dose medications Trend BMP / urinary output Replace electrolytes as indicated Avoid nephrotoxic agents  as able, ensure adequate renal perfusion  Bradycardia/ Tachycardia -2/26 bradycardia, brief asystole with ROSC associated with bowel movement P: Defer to Cardiology  Tele monitoring  Left  pleural effusion P: Fluid exudative by LDH but not protein criteria, no evidence of infection, would not change management at this point  Esophageal cancer P: Post operative care per TCTS   SIRS response -possible developing sepsis vs EtOH withdrawal, anxiety, reactive leukocytosis  -Lactic acid elevated 3.1  P Abx stopped on 2/29 CT ABD/Pelvis pending > unable to go due to brady issues / resp status   PCCM will sign off, please call back if needed  Best practice:  Diet: NPO Pain/Anxiety/Delirium protocol (if indicated): CIWA stepdown VAP protocol (if indicated): n/a DVT prophylaxis: SCD GI prophylaxis: Famotidine Glucose control: SSI Mobility: OOB to chair Code Status: full Family Communication: Patient updated on plan of care 2/28 Disposition: ICU   Labs   CBC: Recent Labs  Lab 06/08/18 0434 06/09/18 0415 06/10/18 0245 06/11/18 0305 06/12/18 0358  WBC 12.5* 11.4* 11.3* 12.6* 10.9*  HGB 9.5* 9.6* 9.2* 10.0* 9.3*  HCT 28.6* 29.3* 28.8* 31.3* 29.5*  MCV 89.7 91.6 92.6 92.3 93.7  PLT 264 328 356 418* 161    Basic Metabolic Panel: Recent Labs  Lab 06/06/18 0324  06/08/18 0434 06/08/18 1939 06/09/18 0415 06/10/18 0245 06/11/18 0305 06/12/18 0358  NA 140   < > 145  --  150* 150* 149* 147*  K 5.1   < > 2.3* 3.3* 2.8* 3.2* 3.8 4.0  CL 104   < > 112*  --  118* 117* 118* 112*  CO2 24   < > 24  --  _0 GLUCOSE 161*   < > 152*  --  119* 105* 139* 109*  BUN 29*   < > 32*  --  34* 36* 39* 39*  CREATININE 3.01*   < > 2.60*  --  2.41* 2.37* 2.42* 2.64*  CALCIUM 7.7*   < > 6.9*  --  7.3* 7.5* 7.6* 8.0*  MG 2.3  --   --   --   --   --   --   --   PHOS 4.5  --   --   --   --   --   --   --    < > = values in this interval not displayed.   GFR: Estimated Creatinine Clearance: 26 mL/min (A) (by C-G formula based on SCr of 2.64 mg/dL (H)). Recent Labs  Lab 06/07/18 0730 06/07/18 1030  06/09/18 0415 06/10/18 0245 06/11/18 0305 06/12/18 0358  WBC  --    --    < > 11.4* 11.3* 12.6* 10.9*  LATICACIDVEN 3.1* 1.5  --   --   --   --   --    < > = values in this interval not displayed.    Liver Function Tests: Recent Labs  Lab 06/07/18 0326 06/10/18 0245 06/11/18 0305  AST 30  --  20  ALT 25  --  18  ALKPHOS 64  --  56  BILITOT 0.9  --  0.2*  PROT 5.2* 5.0* 5.3*  ALBUMIN 2.0*  --  2.4*   Recent Labs  Lab 06/07/18 0740  LIPASE 40  AMYLASE 218*   No results for input(s): AMMONIA in the last 168 hours.  ABG    Component Value Date/Time   PHART 7.479 (H) 06/07/2018 0733   PCO2ART 33.5 06/07/2018 0733   PO2ART 90.0  06/07/2018 0733   HCO3 24.8 06/07/2018 0733   TCO2 26 06/07/2018 0733   ACIDBASEDEF 3.0 (H) 05/30/2018 0400   O2SAT 66.6 06/10/2018 0518     Coagulation Profile: No results for input(s): INR, PROTIME in the last 168 hours.  Cardiac Enzymes: No results for input(s): CKTOTAL, CKMB, CKMBINDEX, TROPONINI in the last 168 hours.  HbA1C: No results found for: HGBA1C  CBG: Recent Labs  Lab 06/11/18 1148 06/11/18 1601 06/11/18 1956 06/11/18 2328 06/12/18 0419  GLUCAP 123* 120* 125* 108* 100*   Rush Farmer, M.D. [facility] Pulmonary/Critical Care Medicine. Pager: [phone]. After hours pager: [phone].

## 2018-06-12 NOTE — Progress Notes (Signed)
Progress Note  Patient Name: Aaron Wang Date of Encounter: 06/12/2018  Primary Cardiologist: No primary care provider on file.  Initial consult by Dr. Irish Lack  Subjective   Patient was down for swallow study this morning -he is now eating pudding and flavored ice  No chest pain or pressure.  No dyspnea.  Still had brief episodes of bradycardia with sinus pauses this morning.  They all seem to be during sleep hours or when startled from sleep.  Quickly resolve.  Thought to potentially be related to vagal tone.     Inpatient Medications    Scheduled Meds: . amLODipine  5 mg Per J Tube Daily  . chlorhexidine gluconate (MEDLINE KIT)  15 mL Mouth Rinse BID  . Chlorhexidine Gluconate Cloth  6 each Topical Daily  . cloNIDine  0.1 mg Transdermal Weekly  . folic acid  1 mg Intravenous Daily  . free water  100 mL Per Tube Q8H  . furosemide  40 mg Intravenous Once  . insulin aspart  0-24 Units Subcutaneous Q4H  . metoprolol tartrate  12.5 mg Per J Tube BID  . pantoprazole (PROTONIX) IV  40 mg Intravenous Q12H  . sodium chloride flush  10-40 mL Intracatheter Q12H  . thiamine injection  100 mg Intravenous Daily   Continuous Infusions: . sodium chloride 10 mL/hr at 06/11/18 0700  . famotidine (PEPCID) IV 20 mg (06/11/18 0918)  . feeding supplement (OSMOLITE 1.2 CAL) 1,000 mL (06/10/18 2256)   PRN Meds: sodium chloride, oxyCODONE **AND** acetaminophen, hydrALAZINE, ondansetron (ZOFRAN) IV, sodium chloride flush   Vital Signs    Vitals:   06/12/18 0630 06/12/18 0700 06/12/18 0800 06/12/18 0900  BP: (!) 163/93 (!) 182/99 (!) 185/108 (!) 184/114  Pulse: 94 (!) 101 91 89  Resp: 20 19 (!) 27 19  Temp:    98.1 F (36.7 C)  TempSrc:    Oral  SpO2: 96% 100% 96% 97%  Weight:      Height:        Intake/Output Summary (Last 24 hours) at 06/12/2018 1018 Last data filed at 06/12/2018 0200 Gross per 24 hour  Intake 1845 ml  Output 925 ml  Net 920 ml   Filed Weights   06/01/18 0600  06/05/18 0515 06/08/18 0615  Weight: 60.7 kg 59.8 kg 61.7 kg   Telemetry    Sinus pause at ~6AM -rate is slowest 32 during pauses.  Also noted short runs of sinus tachycardia - Personally Reviewed  ECG    No new EKG  Physical Exam   Physical Exam  Constitutional: He is oriented to person, place, and time. He appears well-developed and well-nourished. No distress.  I saw him walking in the hallway doing well.  Was using a rolling cart walker.  Then was sitting up in bed eating.  Was very happy to have walk.  HENT:  Head: Normocephalic and atraumatic.  Neck: Normal range of motion. Neck supple. No JVD present.  Cardiovascular: Regular rhythm and normal heart sounds.  No extrasystoles are present. Tachycardia present. PMI is not displaced. Exam reveals decreased pulses (Mildly decreased pedal pulses). Exam reveals no gallop and no friction rub.  No murmur heard. Pulmonary/Chest: Effort normal and breath sounds normal. No respiratory distress. He has no wheezes. He has no rales. He exhibits tenderness (Mild postop tenderness).  Abdominal: Soft. Bowel sounds are normal. He exhibits no distension. There is no abdominal tenderness. There is no rebound.  Musculoskeletal: Normal range of motion.  General: No edema.  Neurological: He is alert and oriented to person, place, and time.  Psychiatric: He has a normal mood and affect. His behavior is normal. Judgment and thought content normal.  Vitals reviewed.   Labs    Chemistry Recent Labs  Lab 06/07/18 0326  06/10/18 0245 06/11/18 0305 06/12/18 0358  NA 140   < > 150* 149* 147*  K 3.3*   < > 3.2* 3.8 4.0  CL 105   < > 117* 118* 112*  CO2 24   < > '25 22 28  '$ GLUCOSE 220*   < > 105* 139* 109*  BUN 32*   < > 36* 39* 39*  CREATININE 3.15*   < > 2.37* 2.42* 2.64*  CALCIUM 7.6*   < > 7.5* 7.6* 8.0*  PROT 5.2*  --  5.0* 5.3*  --   ALBUMIN 2.0*  --   --  2.4*  --   AST 30  --   --  20  --   ALT 25  --   --  18  --   ALKPHOS 64   --   --  56  --   BILITOT 0.9  --   --  0.2*  --   GFRNONAA 20*   < > 29* 28* 25*  GFRAA 24*   < > 33* 32* 29*  ANIONGAP 11   < > '8 9 7   '$ < > = values in this interval not displayed.    Hematology Recent Labs  Lab 06/10/18 0245 06/11/18 0305 06/12/18 0358  WBC 11.3* 12.6* 10.9*  RBC 3.11* 3.39* 3.15*  HGB 9.2* 10.0* 9.3*  HCT 28.8* 31.3* 29.5*  MCV 92.6 92.3 93.7  MCH 29.6 29.5 29.5  MCHC 31.9 31.9 31.5  RDW 14.4 14.6 14.7  PLT 356 418* 393   Cardiac Enzymes No results for input(s): TROPONINI in the last 168 hours. No results for input(s): TROPIPOC in the last 168 hours.   BNP Recent Labs  Lab 06/07/18 0326  BNP 436.2*    DDimer No results for input(s): DDIMER in the last 168 hours.   Radiology    Dg Chest Port 1 View  Result Date: 06/12/2018 CLINICAL DATA:  Chest tube in place. EXAM: PORTABLE CHEST 1 VIEW COMPARISON:  06/11/2018 FINDINGS: Right-sided PICC line unchanged. Lungs are adequately inflated demonstrate continued evidence of bilateral pleural effusions left greater than right without significant change. There is likely bibasilar atelectasis. Slight worsening hazy perihilar interstitial prominence likely interstitial edema. Cardiomediastinal silhouette and remainder of the exam is unchanged. IMPRESSION: Findings suggesting worsening interstitial edema with small bilateral pleural effusions left greater than right likely with associated bibasilar atelectasis. Right-sided PICC line unchanged. Electronically Signed   By: Marin Olp M.D.   On: 06/12/2018 07:44   Dg Chest Port 1 View  Result Date: 06/11/2018 CLINICAL DATA:  Postop from esophagectomy for esophageal carcinoma. Chest tube in place. EXAM: PORTABLE CHEST 1 VIEW COMPARISON:  06/10/2018 FINDINGS: Right arm PICC line remains in appropriate position. No pneumothorax visualized. Diffuse interstitial edema pattern appear stable. Decreased lung volumes are seen with increased atelectasis or infiltrate in both lung  bases. Small bilateral pleural effusions show no significant change. Heart size is stable. IMPRESSION: 1. Decreased lung volumes with increased bibasilar atelectasis versus infiltrates. 2. Stable diffuse interstitial edema pattern and small bilateral pleural effusions. Electronically Signed   By: Earle Gell M.D.   On: 06/11/2018 09:05   Dg Esophagus W Single Cm (sol Or Thin  Ba)  Addendum Date: 06/12/2018   ADDENDUM REPORT: 06/12/2018 09:57 ADDENDUM: Discussed patient with Dr. Servando Snare. The patient has a proximal anastomosis at the thoracic inlet. No evidence of leak at the proximal anastomosis. The patient does not have a distal gastric anastomosis (gastrojejunostomy). Narrowing at the gastric outlet likely reflects postsurgical change/edema related to pyloromyotomy. However, this remains patent. No evidence of aspiration. Electronically Signed   By: Julian Hy M.D.   On: 06/12/2018 09:57   Result Date: 06/12/2018 CLINICAL DATA:  Postop esophagectomy EXAM: ESOPHOGRAM/BARIUM SWALLOW TECHNIQUE: Single contrast examination was performed using  thin barium. FLUOROSCOPY TIME:  Fluoroscopy Time:  48 seconds Radiation Exposure Index (if provided by the fluoroscopic device): 11.8 mGy Number of Acquired Spot Images: 8 COMPARISON:  None. FINDINGS: Status post esophagectomy with gastric pull-through. Patent gastrojejunostomy. Anastomosis is mildly narrowed, although contrast freely flows into small bowel. No evidence of leak. IMPRESSION: Status post esophagectomy with gastric pull-through. Patent gastrojejunostomy. No evidence of leak. Electronically Signed: By: Julian Hy M.D. On: 06/12/2018 08:30   Cardiac Studies   TTE 06/03/2018 1. The left ventricle has normal systolic function with an ejection fraction of 60-65%. The cavity size was normal. There is mildly increased left ventricular wall thickness. Left ventricular diastolic Doppler parameters are consistent with impaired  relaxation No evidence  of left ventricular regional wall motion abnormalities. 2. The right ventricle has normal systolic function. The cavity was normal. There is no increase in right ventricular wall thickness. Estimated RVSP 36.4 mmHg. 3. The pericardial effusion is posterior to the left ventricle. 4. Trivial pericardial effusion is present. 5. The aortic valve is tricuspid. Mild calcification of the aortic valve.. Mild to moderate aortic annular calcification noted. 6. The mitral valve is normal in structure. 7. The tricuspid valve is normal in structure. Tricuspid valve regurgitation is mild-moderate. 8. The aortic root is normal in size and structure. 9. Moderate pleural effusion in the left lateral region.  Status post left-sided thoracentesis on (Friday, February 28)  Patient Profile   Aaron Wang is a 63 y.o. male with a hx of HTN and esophageal cancer who is being seen for the evaluation of bradycardia.  Assessment & Plan    Sinus Bradycardia / Tachycardia  He did have some bradycardia overnight as low as the 30s, with some sinus pauses.  This is all during sleep hours.  Is on low-dose metoprolol, may want to consider converting to carvedilol which has less AV nodal blockade effect.  As most of these episodes of bradycardia are during hours of sleep and with awakening, suspect it may be related to sleep disordered breathing/apnea, no acute indication for pacemaker.  Accelerated hypertension (no further hypotension  Renal artery Dopplers pending  Has PRN labetalol and hydralazine written --plan had been to try to limit use of hydralazine because of tachycardia.  Labetalol is less likely to cause long term bradycardia.  Converted from nicardipine drip to clonidine patch, may need higher dose clonidine patch.  Since weaned off nicardipine drip, will start low-dose Norvasc.  Start at 5 mg daily.  Convert from metoprolol to carvedilol.  Start at 3.125 twice daily.  Plan for gentle  diuresis today with IV Lasix x1 per CVTS.  For questions or updates, please contact [place] Please consult [email or web address] for contact info under Cardiology/STEMI.   Signed, Glenetta Hew, MD  06/12/2018, 10:18 AM

## 2018-06-13 ENCOUNTER — Inpatient Hospital Stay (HOSPITAL_COMMUNITY): Payer: Medicaid Other

## 2018-06-13 DIAGNOSIS — I160 Hypertensive urgency: Secondary | ICD-10-CM

## 2018-06-13 LAB — BASIC METABOLIC PANEL
Anion gap: 6 (ref 5–15)
BUN: 36 mg/dL — ABNORMAL HIGH (ref 6–20)
CO2: 29 mmol/L (ref 22–32)
Calcium: 8.2 mg/dL — ABNORMAL LOW (ref 8.9–10.3)
Chloride: 112 mmol/L — ABNORMAL HIGH (ref 98–111)
Creatinine, Ser: 2.28 mg/dL — ABNORMAL HIGH (ref 0.61–1.24)
GFR calc Af Amer: 35 mL/min — ABNORMAL LOW (ref >60)
GFR calc non Af Amer: 30 mL/min — ABNORMAL LOW (ref >60)
Glucose, Bld: 101 mg/dL — ABNORMAL HIGH (ref 70–99)
Potassium: 3.8 mmol/L (ref 3.5–5.1)
Sodium: 147 mmol/L — ABNORMAL HIGH (ref 135–145)

## 2018-06-13 LAB — CBC
HCT: 28.9 % — ABNORMAL LOW (ref 39.0–52.0)
Hemoglobin: 9.3 g/dL — ABNORMAL LOW (ref 13.0–17.0)
MCH: 30.2 pg (ref 26.0–34.0)
MCHC: 32.2 g/dL (ref 30.0–36.0)
MCV: 93.8 fL (ref 80.0–100.0)
Platelets: 413 10*3/uL — ABNORMAL HIGH (ref 150–400)
RBC: 3.08 MIL/uL — ABNORMAL LOW (ref 4.22–5.81)
RDW: 14.5 % (ref 11.5–15.5)
WBC: 10 10*3/uL (ref 4.0–10.5)
nRBC: 0 % (ref 0.0–0.2)

## 2018-06-13 LAB — GLUCOSE, CAPILLARY
Glucose-Capillary: 102 mg/dL — ABNORMAL HIGH (ref 70–99)
Glucose-Capillary: 114 mg/dL — ABNORMAL HIGH (ref 70–99)
Glucose-Capillary: 130 mg/dL — ABNORMAL HIGH (ref 70–99)
Glucose-Capillary: 81 mg/dL (ref 70–99)
Glucose-Capillary: 88 mg/dL (ref 70–99)

## 2018-06-13 LAB — CULTURE, BODY FLUID W GRAM STAIN -BOTTLE: Culture: NO GROWTH

## 2018-06-13 MED ORDER — FAMOTIDINE 20 MG PO TABS
20.0000 mg | ORAL_TABLET | Freq: Every day | ORAL | Status: DC
  Administered 2018-06-14 – 2018-06-17 (×4): 20 mg via ORAL
  Filled 2018-06-13 (×4): qty 1

## 2018-06-13 MED ORDER — AMLODIPINE BESYLATE 10 MG PO TABS: 10.0000 mg | ORAL_TABLET | Freq: Every day | ORAL | Status: DC

## 2018-06-13 MED ORDER — AMLODIPINE BESYLATE 10 MG PO TABS
10.0000 mg | ORAL_TABLET | Freq: Every day | ORAL | Status: DC
  Administered 2018-06-14 – 2018-07-04 (×20): 10 mg via ORAL
  Filled 2018-06-13 (×20): qty 1

## 2018-06-13 MED ORDER — CARVEDILOL 3.125 MG PO TABS
3.1250 mg | ORAL_TABLET | Freq: Two times a day (BID) | ORAL | Status: DC
  Administered 2018-06-13: 3.125 mg via ORAL
  Filled 2018-06-13: qty 1

## 2018-06-13 MED ORDER — AMLODIPINE BESYLATE 5 MG PO TABS
5.0000 mg | ORAL_TABLET | Freq: Once | ORAL | Status: AC
  Administered 2018-06-13: 5 mg via ORAL
  Filled 2018-06-13: qty 1

## 2018-06-13 MED ORDER — GUAIFENESIN ER 600 MG PO TB12
600.0000 mg | ORAL_TABLET | Freq: Two times a day (BID) | ORAL | Status: DC
  Administered 2018-06-13 – 2018-06-25 (×15): 600 mg via ORAL
  Filled 2018-06-13 (×24): qty 1

## 2018-06-13 MED ORDER — FUROSEMIDE 10 MG/ML IJ SOLN
40.0000 mg | Freq: Once | INTRAMUSCULAR | Status: AC
  Administered 2018-06-13: 40 mg via INTRAVENOUS
  Filled 2018-06-13: qty 4

## 2018-06-13 NOTE — Progress Notes (Signed)
Renal artery duplex       has been completed. Preliminary results can be found under CV proc through chart review. Melanee Cordial, BS, RDMS, RVT   

## 2018-06-13 NOTE — Progress Notes (Signed)
CT surgery p.m.  Patient resting quietly, saturation satisfactory on 2 L Complaining of some dry coughing Mucinex ordered as needed

## 2018-06-13 NOTE — Progress Notes (Signed)
Patient ID: Aaron Wang, male   DOB: 29-Apr-1955, 63 y.o.   MRN: [id number] TCTS DAILY ICU PROGRESS NOTE                   [address]            [place] [address]          [phone]   15 Days Post-Op Procedure(s) (LRB): TRANSHIATAL TOTAL ESOPHAGECTOMY (N/A) PYLOROMYOTOMY (N/A) FEEDING JEJUNOSTOMY (N/A) CERVICOESOPHAGO GASTROSTOMY (Right) Flexible Bronchoscopy LEFT CHEST TUBE INSERTION (Left)  Total Length of Stay:  LOS: 15 days   Subjective: Breathing better, took full liquids   Objective: Vital signs in last 24 hours: Temp:  [98.1 F (36.7 C)-98.7 F (37.1 C)] 98.7 F (37.1 C) (03/03 0400) Pulse Rate:  [78-101] 88 (03/03 0700) Cardiac Rhythm: Sinus tachycardia (03/03 0600) Resp:  [15-27] 20 (03/03 0700) BP: (162-187)/(77-114) 168/96 (03/03 0700) SpO2:  [94 %-99 %] 98 % (03/03 0700)  Filed Weights   06/01/18 0600 06/05/18 0515 06/08/18 0615  Weight: 60.7 kg 59.8 kg 61.7 kg    Weight change:    Hemodynamic parameters for last 24 hours:    Intake/Output from previous day: 03/02 0701 - 03/03 0700 In: 1020 [P.O.:240; NG/GT:780] Out: 650 [Urine:650]  Intake/Output this shift: No intake/output data recorded.  Current Meds: Scheduled Meds: . amLODipine  5 mg Per J Tube Daily  . carvedilol  3.125 mg Per J Tube BID WC  . chlorhexidine gluconate (MEDLINE KIT)  15 mL Mouth Rinse BID  . Chlorhexidine Gluconate Cloth  6 each Topical Daily  . cloNIDine  0.1 mg Transdermal Weekly  . famotidine  20 mg Per Tube Daily  . folic acid  1 mg Intravenous Daily  . free water  100 mL Per Tube Q8H  . insulin aspart  0-24 Units Subcutaneous Q4H  . pantoprazole (PROTONIX) IV  40 mg Intravenous Q12H  . sodium chloride flush  10-40 mL Intracatheter Q12H  . thiamine injection  100 mg Intravenous Daily   Continuous Infusions: . sodium chloride 10 mL/hr at 06/11/18 0700  . feeding supplement (OSMOLITE 1.2 CAL) Stopped (06/13/18 0000)   PRN Meds:.sodium  chloride, oxyCODONE **AND** acetaminophen, diphenoxylate-atropine, hydrALAZINE, ondansetron (ZOFRAN) IV, sodium chloride flush  General appearance: alert and cooperative Neurologic: intact Heart: regular rate and rhythm, S1, S2 normal, no murmur, click, rub or gallop Lungs: diminished breath sounds bibasilar Abdomen: soft, non-tender; bowel sounds normal; no masses,  no organomegaly Extremities: extremities normal, atraumatic, no cyanosis or edema and Homans sign is negative, no sign of DVT Wound: incisions intact, no drainage from neck drain, moved out some today   Lab Results: CBC: Recent Labs    06/12/18 0358 06/13/18 0411  WBC 10.9* 10.0  HGB 9.3* 9.3*  HCT 29.5* 28.9*  PLT 393 413*   BMET:  Recent Labs    06/12/18 0358 06/13/18 0411  NA 147* 147*  K 4.0 3.8  CL 112* 112*  CO2 28 29  GLUCOSE 109* 101*  BUN 39* 36*  CREATININE 2.64* 2.28*  CALCIUM 8.0* 8.2*    CMET: Lab Results  Component Value Date   WBC 10.0 06/13/2018   HGB 9.3 (L) 06/13/2018   HCT 28.9 (L) 06/13/2018   PLT 413 (H) 06/13/2018   GLUCOSE 101 (H) 06/13/2018   CHOL 94 06/11/2018   TRIG 55 06/11/2018   HDL 27 (L) 06/11/2018   LDLDIRECT 112 (H) 11/26/2009   LDLCALC 56 06/11/2018   ALT 18 06/11/2018  AST 20 06/11/2018   NA 147 (H) 06/13/2018   K 3.8 06/13/2018   CL 112 (H) 06/13/2018   CREATININE 2.28 (H) 06/13/2018   BUN 36 (H) 06/13/2018   CO2 29 06/13/2018   TSH 0.925 11/26/2009   PSA 0.63 04/16/2010   INR 1.10 05/25/2018   MICROALBUR 0.50 01/01/2010      PT/INR: No results for input(s): LABPROT, INR in the last 72 hours. Radiology: Dg Esophagus W Single Cm (sol Or Thin Ba)  Addendum Date: 06/12/2018   ADDENDUM REPORT: 06/12/2018 09:57 ADDENDUM: Discussed patient with Dr. Servando Snare. The patient has a proximal anastomosis at the thoracic inlet. No evidence of leak at the proximal anastomosis. The patient does not have a distal gastric anastomosis (gastrojejunostomy). Narrowing at the  gastric outlet likely reflects postsurgical change/edema related to pyloromyotomy. However, this remains patent. No evidence of aspiration. Electronically Signed   By: Julian Hy M.D.   On: 06/12/2018 09:57   Result Date: 06/12/2018 CLINICAL DATA:  Postop esophagectomy EXAM: ESOPHOGRAM/BARIUM SWALLOW TECHNIQUE: Single contrast examination was performed using  thin barium. FLUOROSCOPY TIME:  Fluoroscopy Time:  48 seconds Radiation Exposure Index (if provided by the fluoroscopic device): 11.8 mGy Number of Acquired Spot Images: 8 COMPARISON:  None. FINDINGS: Status post esophagectomy with gastric pull-through. Patent gastrojejunostomy. Anastomosis is mildly narrowed, although contrast freely flows into small bowel. No evidence of leak. IMPRESSION: Status post esophagectomy with gastric pull-through. Patent gastrojejunostomy. No evidence of leak. Electronically Signed: By: Julian Hy M.D. On: 06/12/2018 08:30     Assessment/Plan: S/P Procedure(s) (LRB): TRANSHIATAL TOTAL ESOPHAGECTOMY (N/A) PYLOROMYOTOMY (N/A) FEEDING JEJUNOSTOMY (N/A) CERVICOESOPHAGO GASTROSTOMY (Right) Flexible Bronchoscopy LEFT CHEST TUBE INSERTION (Left) Renal duplex today - evaluate for rvhypertension Renal function improving  bilateral pleural effusions- stable   [name] 06/13/2018 7:45 AM

## 2018-06-13 NOTE — Progress Notes (Signed)
Patient resting comfortably on 2L [place] with stable vitals and no respiratory distress noted. BIPAP is not needed at this time. RT will monitor as needed.

## 2018-06-13 NOTE — Progress Notes (Signed)
Progress Note  Patient Name: Aaron Wang Date of Encounter: 06/13/2018  Primary Cardiologist: No primary care provider on file.  Initial consult by Dr. Irish Lack  Subjective   Has passed his swallow study.  Now eating soup, pudding and shaved ice.  No further chest pain or pressure.  No dyspnea.  Mild edema.  Yesterday metoprolol converted to carvedilol, started amlodipine.  Was also converted to clonidine patch. --  Slow start rate this morning was 50 bpm.  No further pauses.   Inpatient Medications    Scheduled Meds: . amLODipine  5 mg Per J Tube Daily  . carvedilol  3.125 mg Per J Tube BID WC  . chlorhexidine gluconate (MEDLINE KIT)  15 mL Mouth Rinse BID  . Chlorhexidine Gluconate Cloth  6 each Topical Daily  . cloNIDine  0.1 mg Transdermal Weekly  . famotidine  20 mg Per Tube Daily  . folic acid  1 mg Intravenous Daily  . free water  100 mL Per Tube Q8H  . insulin aspart  0-24 Units Subcutaneous Q4H  . pantoprazole (PROTONIX) IV  40 mg Intravenous Q12H  . sodium chloride flush  10-40 mL Intracatheter Q12H  . thiamine injection  100 mg Intravenous Daily   Continuous Infusions: . sodium chloride 10 mL/hr at 06/11/18 0700  . feeding supplement (OSMOLITE 1.2 CAL) Stopped (06/13/18 0000)   PRN Meds: sodium chloride, oxyCODONE **AND** acetaminophen, diphenoxylate-atropine, hydrALAZINE, ondansetron (ZOFRAN) IV, sodium chloride flush   Vital Signs    Vitals:   06/13/18 0700 06/13/18 0800 06/13/18 0833 06/13/18 0900  BP: (!) 168/96 (!) 182/111  (!) 161/88  Pulse: 88 76  (!) 53  Resp: '20 17  10  ' Temp:   98.5 F (36.9 C)   TempSrc:   Oral   SpO2: 98% 95%  97%  Weight:      Height:        Intake/Output Summary (Last 24 hours) at 06/13/2018 1124 Last data filed at 06/13/2018 0000 Gross per 24 hour  Intake 1020 ml  Output 450 ml  Net 570 ml   Filed Weights   06/01/18 0600 06/05/18 0515 06/08/18 0615  Weight: 60.7 kg 59.8 kg 61.7 kg   Telemetry    Sinus pause  at ~6AM -rate is slowest 32 during pauses.  Also noted short runs of sinus tachycardia - Personally Reviewed  ECG    No new EKG  Physical Exam   Physical Exam  Constitutional: He is oriented to person, place, and time. He appears well-developed and well-nourished. No distress.  Sitting up in bed eating.  Appears comfortable.  HENT:  Head: Normocephalic and atraumatic.  Neck: Normal range of motion. Neck supple. No JVD present.  Cardiovascular: Regular rhythm and normal heart sounds.  No extrasystoles are present. Tachycardia present. PMI is not displaced. Exam reveals decreased pulses (Mildly decreased pedal pulses). Exam reveals no gallop and no friction rub.  No murmur heard. Pulmonary/Chest: Effort normal and breath sounds normal. No respiratory distress. He has no wheezes. He has no rales. He exhibits tenderness (Mild postop tenderness).  Abdominal: Soft. Bowel sounds are normal. He exhibits no distension. There is no abdominal tenderness. There is no rebound.  Musculoskeletal: Normal range of motion.        General: Edema (1+ edema) present.  Neurological: He is alert and oriented to person, place, and time.  Psychiatric: He has a normal mood and affect. His behavior is normal. Judgment and thought content normal.  Vitals reviewed.  Labs    Chemistry Recent Labs  Lab 06/07/18 0326  06/10/18 0245 06/11/18 0305 06/12/18 0358 06/13/18 0411  NA 140   < > 150* 149* 147* 147*  K 3.3*   < > 3.2* 3.8 4.0 3.8  CL 105   < > 117* 118* 112* 112*  CO2 24   < > '25 22 28 29  ' GLUCOSE 220*   < > 105* 139* 109* 101*  BUN 32*   < > 36* 39* 39* 36*  CREATININE 3.15*   < > 2.37* 2.42* 2.64* 2.28*  CALCIUM 7.6*   < > 7.5* 7.6* 8.0* 8.2*  PROT 5.2*  --  5.0* 5.3*  --   --   ALBUMIN 2.0*  --   --  2.4*  --   --   AST 30  --   --  20  --   --   ALT 25  --   --  18  --   --   ALKPHOS 64  --   --  56  --   --   BILITOT 0.9  --   --  0.2*  --   --   GFRNONAA 20*   < > 29* 28* 25* 30*    GFRAA 24*   < > 33* 32* 29* 35*  ANIONGAP 11   < > '8 9 7 6   ' < > = values in this interval not displayed.    Hematology Recent Labs  Lab 06/11/18 0305 06/12/18 0358 06/13/18 0411  WBC 12.6* 10.9* 10.0  RBC 3.39* 3.15* 3.08*  HGB 10.0* 9.3* 9.3*  HCT 31.3* 29.5* 28.9*  MCV 92.3 93.7 93.8  MCH 29.5 29.5 30.2  MCHC 31.9 31.5 32.2  RDW 14.6 14.7 14.5  PLT 418* 393 413*   Cardiac Enzymes No results for input(s): TROPONINI in the last 168 hours. No results for input(s): TROPIPOC in the last 168 hours.   BNP Recent Labs  Lab 06/07/18 0326  BNP 436.2*    DDimer No results for input(s): DDIMER in the last 168 hours.   Radiology    Dg Chest 2 View  Result Date: 06/13/2018 CLINICAL DATA:  Postop esophagectomy.  Shortness of breath. EXAM: CHEST - 2 VIEW COMPARISON:  06/12/2018 FINDINGS: A right PICC remains in place terminating over the lower SVC. The cardiomediastinal silhouette is unchanged. There are small right and small to moderate left pleural effusions, similar to the prior study allowing for differences in patient positioning. There is improved appearance of the lungs bilaterally with residual interstitial densities most notable in the right midlung. There is minimal residual left midlung density, and there is likely bibasilar atelectasis. No pneumothorax is identified. No acute osseous abnormality is seen. IMPRESSION: 1. Improved lung aeration bilaterally with mild residual interstitial densities compatible with decreased edema. 2. Small right and small to moderate left pleural effusions. Electronically Signed   By: Logan Bores M.D.   On: 06/13/2018 07:57   Dg Chest Port 1 View  Result Date: 06/12/2018 CLINICAL DATA:  Chest tube in place. EXAM: PORTABLE CHEST 1 VIEW COMPARISON:  06/11/2018 FINDINGS: Right-sided PICC line unchanged. Lungs are adequately inflated demonstrate continued evidence of bilateral pleural effusions left greater than right without significant change. There  is likely bibasilar atelectasis. Slight worsening hazy perihilar interstitial prominence likely interstitial edema. Cardiomediastinal silhouette and remainder of the exam is unchanged. IMPRESSION: Findings suggesting worsening interstitial edema with small bilateral pleural effusions left greater than right likely with associated bibasilar atelectasis.  Right-sided PICC line unchanged. Electronically Signed   By: Marin Olp M.D.   On: 06/12/2018 07:44   Vas US Renal Artery Duplex  Result Date: 06/13/2018 ABDOMINAL VISCERAL High Risk Factors: Hypertension. Performing Technologist: June Leap RDMS, RVT  Examination Guidelines: A complete evaluation includes B-mode imaging, spectral Doppler, color Doppler, and power Doppler as needed of all accessible portions of each vessel. Bilateral testing is considered an integral part of a complete examination. Limited examinations for reoccurring indications may be performed as noted.  Duplex Findings: +--------------------+--------+--------+------+--------+ Mesenteric          PSV cm/sEDV cm/sPlaqueComments +--------------------+--------+--------+------+--------+ Aorta at SMA           52      10                  +--------------------+--------+--------+------+--------+ Celiac Artery Origin  219      55                  +--------------------+--------+--------+------+--------+ SMA Proximal           93      9                   +--------------------+--------+--------+------+--------+  +------------------+--------+--------+-------+ Right Renal ArteryPSV cm/sEDV cm/sComment +------------------+--------+--------+-------+ Origin               63      8            +------------------+--------+--------+-------+ Proximal             54      9            +------------------+--------+--------+-------+ Mid                  56      7            +------------------+--------+--------+-------+ Distal               64      6             +------------------+--------+--------+-------+ +-----------------+--------+--------+-------+ Left Renal ArteryPSV cm/sEDV cm/sComment +-----------------+--------+--------+-------+ Origin              56      10           +-----------------+--------+--------+-------+ Proximal            77      12           +-----------------+--------+--------+-------+ Mid                 61      10           +-----------------+--------+--------+-------+ Distal              64      9            +-----------------+--------+--------+-------+ Technologist observations: Right lobe liver hyperechoic lesion consistent with a hemangioma. Bilateral pleural effusions noted. Bilateral kidney atypical cortical appearance with dilated collecting ducts. +------------+--------+--------+----+-----------+--------+--------+----+ Right KidneyPSV cm/sEDV cm/sRI  Left KidneyPSV cm/sEDV cm/sRI   +------------+--------+--------+----+-----------+--------+--------+----+ Upper Pole  27      5       0.82Upper Pole 27      8       0.72 +------------+--------+--------+----+-----------+--------+--------+----+ Mid         32      9       0.72Mid        23      5  0.78 +------------+--------+--------+----+-----------+--------+--------+----+ Lower Pole  28      4       0.85Lower Pole 37      7       0.81 +------------+--------+--------+----+-----------+--------+--------+----+ Hilar       70      9       0.87Hilar      64      12      0.81 +------------+--------+--------+----+-----------+--------+--------+----+ +------------------+-----+------------------+-----+ Right Kidney           Left Kidney             +------------------+-----+------------------+-----+ RAR                    RAR                     +------------------+-----+------------------+-----+ RAR (manual)           RAR (manual)            +------------------+-----+------------------+-----+ Cortex                  Cortex                  +------------------+-----+------------------+-----+ Cortex thickness       Corex thickness         +------------------+-----+------------------+-----+ Kidney length (cm)11.43Kidney length (cm)11.39 +------------------+-----+------------------+-----+   Summary: Renal:  Right: No evidence of right renal artery stenosis. Abnormal right        Resistive Index. Left:  No evidence of left renal artery stenosis. Abnormal left        Resisitve Index.  *See table(s) above for measurements and observations.     Preliminary    Dg Esophagus W Single Cm (sol Or Thin Ba)  Addendum Date: 06/12/2018   ADDENDUM REPORT: 06/12/2018 09:57 ADDENDUM: Discussed patient with Dr. Servando Snare. The patient has a proximal anastomosis at the thoracic inlet. No evidence of leak at the proximal anastomosis. The patient does not have a distal gastric anastomosis (gastrojejunostomy). Narrowing at the gastric outlet likely reflects postsurgical change/edema related to pyloromyotomy. However, this remains patent. No evidence of aspiration. Electronically Signed   By: Julian Hy M.D.   On: 06/12/2018 09:57   Result Date: 06/12/2018 CLINICAL DATA:  Postop esophagectomy EXAM: ESOPHOGRAM/BARIUM SWALLOW TECHNIQUE: Single contrast examination was performed using  thin barium. FLUOROSCOPY TIME:  Fluoroscopy Time:  48 seconds Radiation Exposure Index (if provided by the fluoroscopic device): 11.8 mGy Number of Acquired Spot Images: 8 COMPARISON:  None. FINDINGS: Status post esophagectomy with gastric pull-through. Patent gastrojejunostomy. Anastomosis is mildly narrowed, although contrast freely flows into small bowel. No evidence of leak. IMPRESSION: Status post esophagectomy with gastric pull-through. Patent gastrojejunostomy. No evidence of leak. Electronically Signed: By: Julian Hy M.D. On: 06/12/2018 08:30   Cardiac Studies   TTE 06/03/2018 1. The left ventricle has normal systolic function with  an ejection fraction of 60-65%. The cavity size was normal. There is mildly increased left ventricular wall thickness. Left ventricular diastolic Doppler parameters are consistent with impaired  relaxation No evidence of left ventricular regional wall motion abnormalities. 2. The right ventricle has normal systolic function. The cavity was normal. There is no increase in right ventricular wall thickness. Estimated RVSP 36.4 mmHg. 3. The pericardial effusion is posterior to the left ventricle. 4. Trivial pericardial effusion is present. 5. The aortic valve is tricuspid. Mild calcification of the aortic valve.. Mild to moderate aortic annular calcification noted. 6. The mitral valve is normal  in structure. 7. The tricuspid valve is normal in structure. Tricuspid valve regurgitation is mild-moderate. 8. The aortic root is normal in size and structure. 9. Moderate pleural effusion in the left lateral region.  Status post left-sided thoracentesis on (Friday, February 28)  Patient Profile   YITZHAK AWAN is a 63 y.o. male with a hx of HTN and esophageal cancer who is being seen for the evaluation of bradycardia.  Assessment & Plan    Sinus Bradycardia / Tachycardia  No profound episodes this morning.  All episodes have been during sleeping hours with sinus pauses and rates in the 30s. -Slowest this morning was 50s.  Converted from metoprolol to carvedilol with less AV nodal effect -if heart rates remain stable, would potentially titrate up carvedilol tomorrow.  As most of these episodes of bradycardia are during hours of sleep and with awakening, suspect it may be related to sleep disordered breathing/apnea, no acute indication for pacemaker.  Accelerated hypertension (no further hypotension  Renal artery Dopplers pending -preliminary read suggests no stenosis.  Trying to limit use of PRN's, especially hydralazine to avoid tachycardia.   Converted from nicardipine drip to  amlodipine and clonidine patch.  Norvasc started yesterday at 5 mg, will give additional 5 mg now as blood pressure still high 4 hours later.  Change to 10 mg tomorrow..  Would not titrate carvedilol beyond 3.125 twice daily given bradycardia concerns..  IV Lasix given yesterday, now with some mild edema will give 1 more dose of IV Lasix 40 mg x 1.  He was on home dose of ARB-HCTZ that is currently being held for acute renal insufficiency that seems to be normalizing. His creatinine level stabilizes, we can potentially consider restarting ARB.  I suspect that he is probably stable enough to transfer out of the ICU today.  Blood pressures are better controlled and he has been tolerating p.o.   For questions or updates, please contact [place] Please consult [email or web address] for contact info under Cardiology/STEMI.   Signed, Glenetta Hew, MD  06/13/2018, 11:24 AM

## 2018-06-14 LAB — CBC
HCT: 32.3 % — ABNORMAL LOW (ref 39.0–52.0)
Hemoglobin: 10.3 g/dL — ABNORMAL LOW (ref 13.0–17.0)
MCH: 29.7 pg (ref 26.0–34.0)
MCHC: 31.9 g/dL (ref 30.0–36.0)
MCV: 93.1 fL (ref 80.0–100.0)
Platelets: 440 10*3/uL — ABNORMAL HIGH (ref 150–400)
RBC: 3.47 MIL/uL — ABNORMAL LOW (ref 4.22–5.81)
RDW: 14.2 % (ref 11.5–15.5)
WBC: 9.3 10*3/uL (ref 4.0–10.5)
nRBC: 0 % (ref 0.0–0.2)

## 2018-06-14 LAB — BASIC METABOLIC PANEL
Anion gap: 8 (ref 5–15)
BUN: 31 mg/dL — ABNORMAL HIGH (ref 6–20)
CO2: 27 mmol/L (ref 22–32)
Calcium: 8.2 mg/dL — ABNORMAL LOW (ref 8.9–10.3)
Chloride: 111 mmol/L (ref 98–111)
Creatinine, Ser: 2.12 mg/dL — ABNORMAL HIGH (ref 0.61–1.24)
GFR calc Af Amer: 38 mL/min — ABNORMAL LOW (ref >60)
GFR calc non Af Amer: 33 mL/min — ABNORMAL LOW (ref >60)
Glucose, Bld: 128 mg/dL — ABNORMAL HIGH (ref 70–99)
Potassium: 3.6 mmol/L (ref 3.5–5.1)
Sodium: 146 mmol/L — ABNORMAL HIGH (ref 135–145)

## 2018-06-14 LAB — GLUCOSE, CAPILLARY
Glucose-Capillary: 131 mg/dL — ABNORMAL HIGH (ref 70–99)
Glucose-Capillary: 117 mg/dL — ABNORMAL HIGH (ref 70–99)
Glucose-Capillary: 133 mg/dL — ABNORMAL HIGH (ref 70–99)

## 2018-06-14 MED ORDER — ENSURE ENLIVE PO LIQD
237.0000 mL | Freq: Three times a day (TID) | ORAL | Status: DC
  Administered 2018-06-14: 237 mL via ORAL

## 2018-06-14 MED ORDER — POTASSIUM CHLORIDE 20 MEQ/15ML (10%) PO SOLN
20.0000 meq | Freq: Once | ORAL | Status: AC
  Administered 2018-06-14: 20 meq via ORAL
  Filled 2018-06-14: qty 15

## 2018-06-14 MED ORDER — CARVEDILOL 6.25 MG PO TABS
6.2500 mg | ORAL_TABLET | Freq: Every morning | ORAL | Status: DC
  Administered 2018-06-14: 6.25 mg via ORAL
  Filled 2018-06-14: qty 1

## 2018-06-14 MED ORDER — POTASSIUM CHLORIDE CRYS ER 20 MEQ PO TBCR
20.0000 meq | EXTENDED_RELEASE_TABLET | ORAL | Status: DC | PRN
  Administered 2018-06-14: 20 meq via ORAL
  Filled 2018-06-14: qty 1

## 2018-06-14 MED ORDER — CARVEDILOL 3.125 MG PO TABS
3.1250 mg | ORAL_TABLET | Freq: Every evening | ORAL | Status: DC
  Administered 2018-06-14: 3.125 mg via ORAL
  Filled 2018-06-14: qty 1

## 2018-06-14 NOTE — Progress Notes (Signed)
Nutrition Follow-up  DOCUMENTATION CODES:   Severe malnutrition in context of acute illness/injury  INTERVENTION:   Tube feeding:  Continue Osmolite 1.2 at goal of 65 ml/hr  Ensure Enlive po TID between meals, each supplement provides 350 kcal and 20 grams of protein  Magic cup TID with meals, each supplement provides 290 kcal and 9 grams of protein  Encourage smaller, frequent meals/snacks  NUTRITION DIAGNOSIS:   Severe Malnutrition related to acute illness(s/p esophagectomy (esophageal cancer)) as evidenced by moderate fat depletion, severe fat depletion, moderate muscle depletion, mild muscle depletion.  Being addressed via tube feeding, diet advancement, supplements  GOAL:   Patient will meet greater than or equal to 90% of their needs  Met  MONITOR:   TF tolerance, Diet advancement, I & O's, Labs  REASON FOR ASSESSMENT:   Consult Enteral/tube feeding initiation and management, Assessment of nutrition requirement/status, Diet education  ASSESSMENT:   63 year old male w/ PMH of HTN, GERD, and esophageal cancer. On 2/17 he underwent a thoracotomy, pyloroplasty, complete esophagectomy, and had a feeding jejunostomy placed.    Diet advanced to Bariatric FL; per RN, pt eating minimally. Pt reports no appetite this AM. Drinking 360-400 mL at meal per I/O. Pt reports she likes Ensure, agreeable to taking between meals. Encouraged pt to eat small amounts more frequently through out the day  Osmolite 1.2 at 65 ml/hr via J-tube Free water 100 mL q 8 hours  Diarrhea improving on lomotil per pt report  Social Work following for ALF placement; pt previously residing in [facility].   Labs: sodium 146 Meds: KCl, lomotil   Diet Order:   Diet Order            Diet bariatric full liquid Room service appropriate? Yes; Fluid consistency: Thin  Diet effective now              EDUCATION NEEDS:   Education needs have been addressed  Skin:  Skin Assessment: Skin  Integrity Issues: Skin Integrity Issues:: Incisions Incisions: abdomen, neck  Last BM:  2/28 (2 loose this morning)  Height:   Ht Readings from Last 1 Encounters:  05/29/18 '5\' 7"'$  (1.702 m)    Weight:   Wt Readings from Last 1 Encounters:  06/08/18 61.7 kg    Ideal Body Weight:  67.27 kg  BMI:  Body mass index is 21.3 kg/m.  Estimated Nutritional Needs:   Kcal:  1800-2000 kcal  Protein:  85-95 g  Fluid:  >/= 1.8L  Kerman Passey MS, RD, LDN, CNSC [phone] Pager  [phone] Weekend/On-Call Pager

## 2018-06-14 NOTE — Progress Notes (Signed)
Patient ID: Aaron Wang, male   DOB: Sep 07, 1955, 63 y.o.   MRN: [id number] [facility] DAILY ICU PROGRESS NOTE                   [address]            [place] [address]          [phone]   16 Days Post-Op Procedure(s) (LRB): TRANSHIATAL TOTAL ESOPHAGECTOMY (N/A) PYLOROMYOTOMY (N/A) FEEDING JEJUNOSTOMY (N/A) CERVICOESOPHAGO GASTROSTOMY (Right) Flexible Bronchoscopy LEFT CHEST TUBE INSERTION (Left)  Total Length of Stay:  LOS: 16 days   Subjective: Walked around the unit  On o2   Objective: Vital signs in last 24 hours: Temp:  [97.8 F (36.6 C)-98.2 F (36.8 C)] 98.2 F (36.8 C) (03/04 0835) Pulse Rate:  [87-104] 91 (03/04 1200) Cardiac Rhythm: Sinus tachycardia (03/04 0830) Resp:  [15-26] 20 (03/04 1200) BP: (150-189)/(81-105) 189/102 (03/04 1420) SpO2:  [96 %-100 %] 97 % (03/04 1200)  Filed Weights   06/01/18 0600 06/05/18 0515 06/08/18 0615  Weight: 60.7 kg 59.8 kg 61.7 kg    Weight change:    Hemodynamic parameters for last 24 hours:    Intake/Output from previous day: 03/03 0701 - 03/04 0700 In: 1775 [P.O.:800; NG/GT:975] Out: 975 [Urine:975]  Intake/Output this shift: Total I/O In: 360 [P.O.:360] Out: -   Current Meds: Scheduled Meds: . amLODipine  10 mg Oral Daily  . carvedilol  3.125 mg Oral QPM  . carvedilol  6.25 mg Oral q morning - 10a  . chlorhexidine gluconate (MEDLINE KIT)  15 mL Mouth Rinse BID  . Chlorhexidine Gluconate Cloth  6 each Topical Daily  . cloNIDine  0.1 mg Transdermal Weekly  . famotidine  20 mg Oral Daily  . folic acid  1 mg Intravenous Daily  . free water  100 mL Per Tube Q8H  . guaiFENesin  600 mg Oral BID  . insulin aspart  0-24 Units Subcutaneous Q4H  . pantoprazole (PROTONIX) IV  40 mg Intravenous Q12H  . sodium chloride flush  10-40 mL Intracatheter Q12H  . thiamine injection  100 mg Intravenous Daily   Continuous Infusions: . sodium chloride 10 mL/hr at 06/11/18 0700  . feeding supplement  (OSMOLITE 1.2 CAL) 1,000 mL (06/14/18 0617)   PRN Meds:.sodium chloride, oxyCODONE **AND** acetaminophen, diphenoxylate-atropine, hydrALAZINE, ondansetron (ZOFRAN) IV, potassium chloride, sodium chloride flush  General appearance: alert and cooperative Neurologic: intact Heart: regular rate and rhythm, S1, S2 normal, no murmur, click, rub or gallop Lungs: diminished breath sounds bibasilar Abdomen: soft, non-tender; bowel sounds normal; no masses,  no organomegaly Extremities: extremities normal, atraumatic, no cyanosis or edema and Homans sign is negative, no sign of DVT Wound: wounds inatct, no neck drainage , penrose now out   Lab Results: CBC: Recent Labs    06/13/18 0411 06/14/18 0359  WBC 10.0 9.3  HGB 9.3* 10.3*  HCT 28.9* 32.3*  PLT 413* 440*   BMET:  Recent Labs    06/13/18 0411 06/14/18 0359  NA 147* 146*  K 3.8 3.6  CL 112* 111  CO2 29 27  GLUCOSE 101* 128*  BUN 36* 31*  CREATININE 2.28* 2.12*  CALCIUM 8.2* 8.2*    CMET: Lab Results  Component Value Date   WBC 9.3 06/14/2018   HGB 10.3 (L) 06/14/2018   HCT 32.3 (L) 06/14/2018   PLT 440 (H) 06/14/2018   GLUCOSE 128 (H) 06/14/2018   CHOL 94 06/11/2018   TRIG 55 06/11/2018  HDL 27 (L) 06/11/2018   LDLDIRECT 112 (H) 11/26/2009   LDLCALC 56 06/11/2018   ALT 18 06/11/2018   AST 20 06/11/2018   NA 146 (H) 06/14/2018   K 3.6 06/14/2018   CL 111 06/14/2018   CREATININE 2.12 (H) 06/14/2018   BUN 31 (H) 06/14/2018   CO2 27 06/14/2018   TSH 0.925 11/26/2009   PSA 0.63 04/16/2010   INR 1.10 05/25/2018   MICROALBUR 0.50 01/01/2010      PT/INR: No results for input(s): LABPROT, INR in the last 72 hours. Radiology: No results found.   Assessment/Plan: S/P Procedure(s) (LRB): TRANSHIATAL TOTAL ESOPHAGECTOMY (N/A) PYLOROMYOTOMY (N/A) FEEDING JEJUNOSTOMY (N/A) CERVICOESOPHAGO GASTROSTOMY (Right) Flexible Bronchoscopy LEFT CHEST TUBE INSERTION (Left) Mobilize Diuresis Plan for transfer to  step-down: see transfer orders     [name] 06/14/2018 2:42 PM

## 2018-06-14 NOTE — Progress Notes (Signed)
      [address]       [place] [address]             [phone]      POD # 16 esophagectomy BP (!) 171/98   Pulse 85   Temp 97.9 F (36.6 C) (Oral)   Resp 20   Ht 5\' 7"  (1.702 m)   Wt 61.7 kg   SpO2 98%   BMI 21.30 kg/m   Intake/Output Summary (Last 24 hours) at 06/14/2018 1804 Last data filed at 06/14/2018 1000 Gross per 24 hour  Intake 880 ml  Output -  Net 880 ml   Awaiting step down bed  Remo Lipps C. Roxan Hockey, MD [facility] [phone]

## 2018-06-14 NOTE — NC FL2 (Addendum)
[place] LEVEL OF CARE SCREENING TOOL     IDENTIFICATION  Patient Name: Aaron Wang Birthdate: 04/11/1956 Sex: male Admission Date (Current Location): 05/29/2018  [facility] and [place] Number:  Herbalist and Address:  [facility] [place], [place] [address] [place], [place] [address]      Provider Number: [id number]  Attending Physician Name and Address:  [name], MD  Relative Name and Phone Number:       Current Level of Care: Hospital Recommended Level of Care: [place] Prior Approval Number:    Date Approved/Denied:   PASRR Number:    Discharge Plan: Other (Comment)(Assisted Living Facility)    Current Diagnoses: Patient Active Problem List   Diagnosis Date Noted  . Protein-calorie malnutrition, severe 06/09/2018  . Acute respiratory failure with hypoxia ([place])   . Aspiration pneumonia ([place])   . Acute dyspnea   . Cardiac arrest, cause unspecified ([place])   . Malnutrition of moderate degree 05/31/2018  . Esophageal cancer ([place]) 05/29/2018  . Coronary artery calcification 05/05/2018  . Primary cancer of middle third of esophagus ([place]) 03/23/2018  . Knee meniscus pain 02/09/2013  . Back pain 02/09/2013  . Carpal tunnel syndrome 02/09/2013  . Current tear of medial cartilage or meniscus of knee 08/26/2011  . Tennis elbow 08/26/2011    Orientation RESPIRATION BLADDER Height & Weight     Self, Situation, Time, Place  O2(Nasal Cannula 2L) Continent Weight: 136 lb (61.7 kg) Height:  5' 7" (170.2 cm)  BEHAVIORAL SYMPTOMS/MOOD NEUROLOGICAL BOWEL NUTRITION STATUS      Continent jejunostomy tube placed, pt can manage his own feeds  AMBULATORY STATUS COMMUNICATION OF NEEDS Skin   Independent Verbally Surgical wounds(Closed incision on abdomen, no dressing. Closed incision on left of neck, guaze dressing, needs change daily.)                       Personal Care Assistance Level of Assistance   Bathing, Feeding, Dressing Bathing Assistance: Independent Feeding assistance: Independent Dressing Assistance: Independent     Functional Limitations Info  Sight, Hearing, Speech Sight Info: Adequate Hearing Info: Adequate Speech Info: Adequate    SPECIAL CARE FACTORS FREQUENCY                       Contractures Contractures Info: Not present    Additional Factors Info  Code Status, Allergies Code Status Info: Full Code Allergies Info: NO known allergies           Current Medications (06/14/2018):  This is the current hospital active medication list Current Facility-Administered Medications  Medication Dose Route Frequency Provider Last Rate Last Dose  . 0.9 %  sodium chloride infusion   Intravenous PRN [name], MD 10 mL/hr at 06/11/18 0700    . oxyCODONE (ROXICODONE) 5 MG/5ML solution 5 mg  5 mg Per Tube Q4H PRN Gaye Pollack, MD   5 mg at 06/13/18 1639   And  . acetaminophen (TYLENOL) solution 325 mg  325 mg Per Tube Q4H PRN Gaye Pollack, MD   325 mg at 06/13/18 0811  . amLODipine (NORVASC) tablet 10 mg  10 mg Oral Daily Einar Grad, RPH   10 mg at 06/14/18 5621  . carvedilol (COREG) tablet 3.125 mg  3.125 mg Oral QPM Leonie Man, MD      . carvedilol (COREG) tablet 6.25 mg  6.25 mg Oral q morning -  10a Harding, David W, MD   6.25 mg at 06/14/18 0922  . chlorhexidine gluconate (MEDLINE KIT) (PERIDEX) 0.12 % solution 15 mL  15 mL Mouth Rinse BID McQuaid, Douglas B, MD   15 mL at 06/14/18 0922  . Chlorhexidine Gluconate Cloth 2 % PADS 6 each  6 each Topical Daily Gerhardt, Edward B, MD   6 each at 06/13/18 1000  . cloNIDine (CATAPRES - Dosed in mg/24 hr) patch 0.1 mg  0.1 mg Transdermal Weekly Gold, Wayne E, PA-C   0.1 mg at 06/09/18 1232  . diphenoxylate-atropine (LOMOTIL) 2.5-0.025 MG/5ML liquid 5 mL  5 mL Per Tube Q4H PRN Roddenberry, Myron G, PA-C   5 mL at 06/13/18 0811  . famotidine (PEPCID) tablet 20 mg  20 mg Oral Daily Bitonti,  Michael T, RPH   20 mg at 06/14/18 0922  . feeding supplement (OSMOLITE 1.2 CAL) liquid 1,000 mL  1,000 mL Per Tube Continuous Gerhardt, Edward B, MD 65 mL/hr at 06/14/18 0617 1,000 mL at 06/14/18 0617  . folic acid injection 1 mg  1 mg Intravenous Daily Bowser, Grace E, NP   1 mg at 06/14/18 0921  . free water 100 mL  100 mL Per Tube Q8H Ollis, Brandi L, NP   100 mL at 06/14/18 0602  . guaiFENesin (MUCINEX) 12 hr tablet 600 mg  600 mg Oral BID Van Trigt, Peter, MD   600 mg at 06/14/18 0922  . hydrALAZINE (APRESOLINE) injection 10-40 mg  10-40 mg Intravenous Q4H PRN Alva, Rakesh V, MD   10 mg at 06/13/18 2346  . insulin aspart (novoLOG) injection 0-24 Units  0-24 Units Subcutaneous Q4H Gold, Wayne E, PA-C   2 Units at 06/13/18 2346  . ondansetron (ZOFRAN) injection 4 mg  4 mg Intravenous Q4H PRN Gold, Wayne E, PA-C   4 mg at 06/02/18 2350  . pantoprazole (PROTONIX) injection 40 mg  40 mg Intravenous Q12H Gold, Wayne E, PA-C   40 mg at 06/14/18 0921  . potassium chloride SA (K-DUR,KLOR-CON) CR tablet 20 mEq  20 mEq Oral Q4H PRN Gerhardt, Edward B, MD   20 mEq at 06/14/18 0620  . sodium chloride flush (NS) 0.9 % injection 10-40 mL  10-40 mL Intracatheter Q12H Gerhardt, Edward B, MD   10 mL at 06/14/18 0923  . sodium chloride flush (NS) 0.9 % injection 10-40 mL  10-40 mL Intracatheter PRN Gerhardt, Edward B, MD      . thiamine (B-1) injection 100 mg  100 mg Intravenous Daily Bowser, Grace E, NP   100 mg at 06/14/18 0922     Discharge Medications: Please see discharge summary for a list of discharge medications.  Relevant Imaging Results:  Relevant Lab Results:   Additional Information SSN: [id number]  [name], LCSW    

## 2018-06-14 NOTE — Progress Notes (Signed)
Progress Note  Patient Name: Aaron Wang Date of Encounter: 06/14/2018  Primary Cardiologist: No primary care provider on file.  Initial consult by Dr. Irish Lack  Subjective   Doing well.  NO CV complaints.    Inpatient Medications    Scheduled Meds: . amLODipine  10 mg Oral Daily  . carvedilol  3.125 mg Oral QPM  . carvedilol  6.25 mg Oral q morning - 10a  . chlorhexidine gluconate (MEDLINE KIT)  15 mL Mouth Rinse BID  . Chlorhexidine Gluconate Cloth  6 each Topical Daily  . cloNIDine  0.1 mg Transdermal Weekly  . famotidine  20 mg Oral Daily  . folic acid  1 mg Intravenous Daily  . free water  100 mL Per Tube Q8H  . guaiFENesin  600 mg Oral BID  . insulin aspart  0-24 Units Subcutaneous Q4H  . pantoprazole (PROTONIX) IV  40 mg Intravenous Q12H  . sodium chloride flush  10-40 mL Intracatheter Q12H  . thiamine injection  100 mg Intravenous Daily   Continuous Infusions: . sodium chloride 10 mL/hr at 06/11/18 0700  . feeding supplement (OSMOLITE 1.2 CAL) 1,000 mL (06/14/18 0617)   PRN Meds: sodium chloride, oxyCODONE **AND** acetaminophen, diphenoxylate-atropine, hydrALAZINE, ondansetron (ZOFRAN) IV, potassium chloride, sodium chloride flush   Vital Signs    Vitals:   06/14/18 0300 06/14/18 0400 06/14/18 0500 06/14/18 0600  BP: (!) 169/92 (!) 178/101 (!) 155/98 (!) 174/100  Pulse: 100 95 89 96  Resp: (!) _0 Temp:  98.2 F (36.8 C)    TempSrc:  Oral    SpO2: 96% 97% 99% 99%  Weight:      Height:        Intake/Output Summary (Last 24 hours) at 06/14/2018 0810 Last data filed at 06/14/2018 0200 Gross per 24 hour  Intake 1775 ml  Output 975 ml  Net 800 ml   Filed Weights   06/01/18 0600 06/05/18 0515 06/08/18 0615  Weight: 60.7 kg 59.8 kg 61.7 kg   Telemetry    This AM - slowest S brady ~44-50 bpm. Now NSR 90s.  Personally Reviewed  ECG    No new EKG  Physical Exam   Physical Exam  Constitutional: He is oriented to person, place, and time.  He appears well-developed and well-nourished. No distress.  Resting comfortably in bed.   HENT:  Head: Normocephalic and atraumatic.  Neck: Normal range of motion. Neck supple. No JVD present.  Cardiovascular: Normal rate, regular rhythm and normal heart sounds.  No extrasystoles are present. PMI is not displaced. Exam reveals decreased pulses (Mildly decreased pedal pulses). Exam reveals no gallop and no friction rub.  No murmur heard. Pulmonary/Chest: Effort normal and breath sounds normal. No respiratory distress. He has no wheezes. He has no rales. He exhibits tenderness (Mild postop tenderness).  Musculoskeletal: Normal range of motion.        General: Edema (1+ edema) present.  Neurological: He is alert and oriented to person, place, and time.  Psychiatric: He has a normal mood and affect. His behavior is normal. Judgment and thought content normal.  Vitals reviewed.   Labs    Chemistry Recent Labs  Lab 06/10/18 0245 06/11/18 0305 06/12/18 0358 06/13/18 0411 06/14/18 0359  NA 150* 149* 147* 147* 146*  K 3.2* 3.8 4.0 3.8 3.6  CL 117* 118* 112* 112* 111  CO2 _1 GLUCOSE 105* 139* 109* 101* 128*  BUN 36* 39* 39* 36* 31*  CREATININE 2.37* 2.42* 2.64* 2.28* 2.12*  CALCIUM 7.5* 7.6* 8.0* 8.2* 8.2*  PROT 5.0* 5.3*  --   --   --   ALBUMIN  --  2.4*  --   --   --   AST  --  20  --   --   --   ALT  --  18  --   --   --   ALKPHOS  --  56  --   --   --   BILITOT  --  0.2*  --   --   --   GFRNONAA 29* 28* 25* 30* 33*  GFRAA 33* 32* 29* 35* 38*  ANIONGAP _0 Hematology Recent Labs  Lab 06/12/18 0358 06/13/18 0411 06/14/18 0359  WBC 10.9* 10.0 9.3  RBC 3.15* 3.08* 3.47*  HGB 9.3* 9.3* 10.3*  HCT 29.5* 28.9* 32.3*  MCV 93.7 93.8 93.1  MCH 29.5 30.2 29.7  MCHC 31.5 32.2 31.9  RDW 14.7 14.5 14.2  PLT 393 413* 440*   Cardiac Enzymes No results for input(s): TROPONINI in the last 168 hours. No results for input(s): TROPIPOC in the last 168 hours.    BNP No results for input(s): BNP, PROBNP in the last 168 hours.  DDimer No results for input(s): DDIMER in the last 168 hours.   Radiology    Dg Chest 2 View  Result Date: 06/13/2018 CLINICAL DATA:  Postop esophagectomy.  Shortness of breath. EXAM: CHEST - 2 VIEW COMPARISON:  06/12/2018 FINDINGS: A right PICC remains in place terminating over the lower SVC. The cardiomediastinal silhouette is unchanged. There are small right and small to moderate left pleural effusions, similar to the prior study allowing for differences in patient positioning. There is improved appearance of the lungs bilaterally with residual interstitial densities most notable in the right midlung. There is minimal residual left midlung density, and there is likely bibasilar atelectasis. No pneumothorax is identified. No acute osseous abnormality is seen. IMPRESSION: 1. Improved lung aeration bilaterally with mild residual interstitial densities compatible with decreased edema. 2. Small right and small to moderate left pleural effusions. Electronically Signed   By: Logan Bores M.D.   On: 06/13/2018 07:57   Vas US Renal Artery Duplex  Result Date: 06/13/2018 ABDOMINAL VISCERAL High Risk Factors: Hypertension. Performing Technologist: June Leap RDMS, RVT  Examination Guidelines: A complete evaluation includes B-mode imaging, spectral Doppler, color Doppler, and power Doppler as needed of all accessible portions of each vessel. Bilateral testing is considered an integral part of a complete examination. Limited examinations for reoccurring indications may be performed as noted.  Duplex Findings: +--------------------+--------+--------+------+--------+ Mesenteric          PSV cm/sEDV cm/sPlaqueComments +--------------------+--------+--------+------+--------+ Aorta at SMA           52      10                  +--------------------+--------+--------+------+--------+ Celiac Artery Origin  219      55                   +--------------------+--------+--------+------+--------+ SMA Proximal           93      9                   +--------------------+--------+--------+------+--------+  +------------------+--------+--------+-------+ Right Renal ArteryPSV cm/sEDV cm/sComment +------------------+--------+--------+-------+ Origin               63  8            +------------------+--------+--------+-------+ Proximal             54      9            +------------------+--------+--------+-------+ Mid                  56      7            +------------------+--------+--------+-------+ Distal               64      6            +------------------+--------+--------+-------+ +-----------------+--------+--------+-------+ Left Renal ArteryPSV cm/sEDV cm/sComment +-----------------+--------+--------+-------+ Origin              56      10           +-----------------+--------+--------+-------+ Proximal            77      12           +-----------------+--------+--------+-------+ Mid                 61      10           +-----------------+--------+--------+-------+ Distal              64      9            +-----------------+--------+--------+-------+ Technologist observations: Right lobe liver hyperechoic lesion consistent with a hemangioma. Bilateral pleural effusions noted. Bilateral kidney atypical cortical appearance with dilated collecting ducts. +------------+--------+--------+----+-----------+--------+--------+----+ Right KidneyPSV cm/sEDV cm/sRI  Left KidneyPSV cm/sEDV cm/sRI   +------------+--------+--------+----+-----------+--------+--------+----+ Upper Pole  27      5       0.82Upper Pole 27      8       0.72 +------------+--------+--------+----+-----------+--------+--------+----+ Mid         32      9       0.72Mid        23      5       0.78 +------------+--------+--------+----+-----------+--------+--------+----+ Lower Pole  28      4        0.85Lower Pole 37      7       0.81 +------------+--------+--------+----+-----------+--------+--------+----+ Hilar       70      9       0.87Hilar      64      12      0.81 +------------+--------+--------+----+-----------+--------+--------+----+ +------------------+-----+------------------+-----+ Right Kidney           Left Kidney             +------------------+-----+------------------+-----+ RAR                    RAR                     +------------------+-----+------------------+-----+ RAR (manual)           RAR (manual)            +------------------+-----+------------------+-----+ Cortex                 Cortex                  +------------------+-----+------------------+-----+ Cortex thickness       Corex thickness         +------------------+-----+------------------+-----+ Kidney length (cm)11.43Kidney length (cm)11.39 +------------------+-----+------------------+-----+  Summary: Renal:  Right: No evidence of right renal artery stenosis. Abnormal right        Resistive Index. Left:  No evidence of left renal artery stenosis. Abnormal left        Resisitve Index.  *See table(s) above for measurements and observations.  Diagnosing physician: Deitra Mayo MD  Electronically signed by Deitra Mayo MD on 06/13/2018 at 3:59:20 PM.    Final    Dg Esophagus W Single Cm (sol Or Thin Ba)  Addendum Date: 06/12/2018   ADDENDUM REPORT: 06/12/2018 09:57 ADDENDUM: Discussed patient with Dr. Servando Snare. The patient has a proximal anastomosis at the thoracic inlet. No evidence of leak at the proximal anastomosis. The patient does not have a distal gastric anastomosis (gastrojejunostomy). Narrowing at the gastric outlet likely reflects postsurgical change/edema related to pyloromyotomy. However, this remains patent. No evidence of aspiration. Electronically Signed   By: Julian Hy M.D.   On: 06/12/2018 09:57   Result Date: 06/12/2018 CLINICAL DATA:  Postop  esophagectomy EXAM: ESOPHOGRAM/BARIUM SWALLOW TECHNIQUE: Single contrast examination was performed using  thin barium. FLUOROSCOPY TIME:  Fluoroscopy Time:  48 seconds Radiation Exposure Index (if provided by the fluoroscopic device): 11.8 mGy Number of Acquired Spot Images: 8 COMPARISON:  None. FINDINGS: Status post esophagectomy with gastric pull-through. Patent gastrojejunostomy. Anastomosis is mildly narrowed, although contrast freely flows into small bowel. No evidence of leak. IMPRESSION: Status post esophagectomy with gastric pull-through. Patent gastrojejunostomy. No evidence of leak. Electronically Signed: By: Julian Hy M.D. On: 06/12/2018 08:30   Cardiac Studies   TTE 06/03/2018 1. The left ventricle has normal systolic function with an ejection fraction of 60-65%. The cavity size was normal. There is mildly increased left ventricular wall thickness. Left ventricular diastolic Doppler parameters are consistent with impaired  relaxation No evidence of left ventricular regional wall motion abnormalities. 2. The right ventricle has normal systolic function. The cavity was normal. There is no increase in right ventricular wall thickness. Estimated RVSP 36.4 mmHg. 3. The pericardial effusion is posterior to the left ventricle. 4. Trivial pericardial effusion is present. 5. The aortic valve is tricuspid. Mild calcification of the aortic valve.. Mild to moderate aortic annular calcification noted. 6. The mitral valve is normal in structure. 7. The tricuspid valve is normal in structure. Tricuspid valve regurgitation is mild-moderate. 8. The aortic root is normal in size and structure. 9. Moderate pleural effusion in the left lateral region.  Status post left-sided thoracentesis on (Friday, February 28)  Patient Profile   Aaron Wang is a 63 y.o. male with a hx of HTN and esophageal cancer who is being seen for the evaluation of bradycardia.  Assessment & Plan    Sinus  Bradycardia / Tachycardia  Again no notable brady episodes this morning.  All episodes have been during sleeping hours with sinus pauses and rates in the 30s. Lowest this AM ~mid 40s..  As most of these episodes of bradycardia are during hours of sleep and with awakening, suspect it may be related to sleep disordered breathing/apnea  ,Increase AM dose of Carvedilol to 6.25 mg today   no acute indication for pacemaker.  Accelerated hypertension (no further hypotension)  Renal artery Dopplers pending -preliminary read suggests no stenosis.  Trying to limit use of PRN's, especially hydralazine to avoid tachycardia.   Converted from nicardipine drip to amlodipine and clonidine patch - continue current dose for now - pending potentially starting back ARB once renal Fxn normalizes.  Norvasc now @ 10 mg this  AM.  Since bradycardia issues are in early AM - will increase AM dose of Carvedilol to 6.25 mg  He was on home dose of ARB-HCTZ that is currently being held for acute renal insufficiency that seems to be normalizing. His creatinine level stabilizes, we can potentially consider restarting ARB once renal function normalizes.  Should be OK to transfer out of ICU.   For questions or updates, please contact [place] Please consult [email or web address] for contact info under Cardiology/STEMI.   Signed, Glenetta Hew, MD  06/14/2018, 8:10 AM

## 2018-06-14 NOTE — Clinical Social Work Note (Signed)
Clinical Social Work Assessment  Patient Details  Name: Aaron Wang MRN: [id number] Date of Birth: [birth year]  Date of referral:  06/14/18               Reason for consult:  Housing Concerns/Homelessness                Permission sought to share information with:  Facility Art therapist granted to share information::  Yes, Verbal Permission Granted  Name::        Agency::  Assisted living facilities  Relationship::     Contact Information:     Housing/Transportation Living arrangements for the past 2 months:  Hotel/Motel Source of Information:  Patient Patient Interpreter Needed:  None Criminal Activity/Legal Involvement Pertinent to Current Situation/Hospitalization:  No - Comment as needed Significant Relationships:  Other Family Members Lives with:  Self Do you feel safe going back to the place where you live?  No Need for family participation in patient care:  No (Coment)  Care giving concerns:  Pt is alert and oriented. No family or friends present at bedside.   Social Worker assessment / plan:  CSW spoke with pt at bedside. Pt states he lives alone in a motel room. Pt states he pays 600$ for the room and gets 741$ in SSI. Pt states "IF" his granddaughter is able to get her own house he would move in with her. Pt states she is trying to get her own place. Pt is agreeable to ALF and states he knows he can't go back to the motel and live alone. Pt was inquiring about price of ALF. CSW confirmed with ALF that pt would need to be applied for special assistance medicaid through DSS and that would be payment for ALF. Pt is agreeable to CSW searching for a ALF that will take him. CSW to follow up with pt with choices when determined.  Employment status:  Disabled (Comment on whether or not currently receiving Disability) Insurance information:  Medicaid In [place] PT Recommendations:  Not assessed at this time Information / Referral to community resources:  Other  (Comment Required)(Assisted living facility)  Patient/Family's Response to care:  Pt verbalized understanding of CSW role and expressed appreciation for support. Pt denies any concern regarding pt care at this time.   Patient/Family's Understanding of and Emotional Response to Diagnosis, Current Treatment, and Prognosis:  Pt understanding and realistic regarding physical limitations. Pt understands the need for ALF placement at d/c. Pt's responses emotionally appropriate during conversation with CSW. Pt denies any concern regarding  treatment plan at this time. CSW will continue to provide support and facilitate d/c needs.   Emotional Assessment Appearance:  Appears stated age Attitude/Demeanor/Rapport:  (pt was appropriate) Affect (typically observed):  Accepting, Appropriate, Calm Orientation:  Oriented to Self, Oriented to Place, Oriented to  Time, Oriented to Situation Alcohol / Substance use:  Not Applicable Psych involvement (Current and /or in the community):  No (Comment)  Discharge Needs  Concerns to be addressed:  Care Coordination, Basic Needs, Lack of Support, Homelessness, Financial / Insurance Concerns Readmission within the last 30 days:  No Current discharge risk:  Lives alone Barriers to Discharge:  Continued Medical Work up, Unsafe home situation   [place], [place] 06/14/2018, 1:39 PM

## 2018-06-15 LAB — BASIC METABOLIC PANEL
Anion gap: 9 (ref 5–15)
BUN: 27 mg/dL — ABNORMAL HIGH (ref 6–20)
CO2: 27 mmol/L (ref 22–32)
Calcium: 8.3 mg/dL — ABNORMAL LOW (ref 8.9–10.3)
Chloride: 108 mmol/L (ref 98–111)
Creatinine, Ser: 1.91 mg/dL — ABNORMAL HIGH (ref 0.61–1.24)
GFR calc Af Amer: 43 mL/min — ABNORMAL LOW (ref >60)
GFR calc non Af Amer: 37 mL/min — ABNORMAL LOW (ref >60)
Glucose, Bld: 129 mg/dL — ABNORMAL HIGH (ref 70–99)
Potassium: 3.9 mmol/L (ref 3.5–5.1)
Sodium: 144 mmol/L (ref 135–145)

## 2018-06-15 LAB — CHOLESTEROL, BODY FLUID: Cholesterol, Fluid: 40 mg/dL

## 2018-06-15 MED ORDER — LABETALOL HCL 5 MG/ML IV SOLN
10.0000 mg | Freq: Once | INTRAVENOUS | Status: AC
  Administered 2018-06-15: 10 mg via INTRAVENOUS
  Filled 2018-06-15: qty 4

## 2018-06-15 MED ORDER — CARVEDILOL 6.25 MG PO TABS
6.2500 mg | ORAL_TABLET | Freq: Every evening | ORAL | Status: DC
  Administered 2018-06-15 – 2018-06-17 (×3): 6.25 mg via ORAL
  Filled 2018-06-15 (×3): qty 1

## 2018-06-15 MED ORDER — CARVEDILOL 12.5 MG PO TABS
12.5000 mg | ORAL_TABLET | Freq: Every morning | ORAL | Status: DC
  Administered 2018-06-15 – 2018-06-18 (×4): 12.5 mg via ORAL
  Filled 2018-06-15 (×4): qty 1

## 2018-06-15 MED ORDER — HYDRALAZINE HCL 20 MG/ML IJ SOLN
10.0000 mg | INTRAMUSCULAR | Status: DC | PRN
  Administered 2018-06-15 (×2): 20 mg via INTRAVENOUS
  Administered 2018-06-16: 40 mg via INTRAVENOUS
  Filled 2018-06-15 (×5): qty 1

## 2018-06-15 NOTE — Progress Notes (Signed)
Progress Note  Patient Name: Aaron Wang Date of Encounter: 06/15/2018  Primary Cardiologist: No primary care provider on file.  Initial consult by Dr. Irish Lack  Subjective   Feels fine.  Just getting tired of being in the ICU. He had some Ensure shakes yesterday evening that made him somewhat queasy.  But otherwise doing okay  Inpatient Medications    Scheduled Meds: . amLODipine  10 mg Oral Daily  . carvedilol  12.5 mg Oral q morning - 10a  . carvedilol  6.25 mg Oral QPM  . chlorhexidine gluconate (MEDLINE KIT)  15 mL Mouth Rinse BID  . Chlorhexidine Gluconate Cloth  6 each Topical Daily  . cloNIDine  0.1 mg Transdermal Weekly  . famotidine  20 mg Oral Daily  . feeding supplement (ENSURE ENLIVE)  237 mL Oral TID BM  . free water  100 mL Per Tube Q8H  . guaiFENesin  600 mg Oral BID  . pantoprazole (PROTONIX) IV  40 mg Intravenous Q12H  . sodium chloride flush  10-40 mL Intracatheter Q12H   Continuous Infusions: . feeding supplement (OSMOLITE 1.2 CAL) 1,000 mL (06/14/18 0617)   PRN Meds: oxyCODONE **AND** acetaminophen, diphenoxylate-atropine, hydrALAZINE, ondansetron (ZOFRAN) IV, potassium chloride, sodium chloride flush   Vital Signs    Vitals:   06/15/18 0700 06/15/18 0747 06/15/18 0923 06/15/18 1000  BP: (!) 179/93  (!) 177/100 (!) 162/94  Pulse: 94  95 90  Resp: 19  (!) 21 18  Temp:  98.5 F (36.9 C)    TempSrc:  Oral    SpO2: 96%  97% 100%  Weight:      Height:        Intake/Output Summary (Last 24 hours) at 06/15/2018 1157 Last data filed at 06/15/2018 0748 Gross per 24 hour  Intake 1040 ml  Output 720 ml  Net 320 ml   Filed Weights   06/01/18 0600 06/05/18 0515 06/08/18 0615  Weight: 60.7 kg 59.8 kg 61.7 kg   Telemetry    This AM -he had 1 brief episode where he went from a tachycardia up to a slow bradycardia in the in the high 30s low 40s and then went right back in a fast heart rate.  For the most part sinus rhythm in the 80s to 110s depending  what he is doing.. Now NSR 90s.  Personally Reviewed  ECG    No new EKG  Physical Exam   Physical Exam  Constitutional: He is oriented to person, place, and time. He appears well-developed and well-nourished. No distress.  Resting comfortably in bed.   HENT:  Head: Normocephalic and atraumatic.  Neck: Normal range of motion. Neck supple. No JVD present.  Cardiovascular: Normal rate, regular rhythm and normal heart sounds.  No extrasystoles are present. PMI is not displaced. Exam reveals decreased pulses (Mildly decreased pedal pulses). Exam reveals no gallop and no friction rub.  No murmur heard. Pulmonary/Chest: Effort normal and breath sounds normal. No respiratory distress. He has no wheezes. He has no rales. He exhibits no tenderness (Mild postop tenderness).  Abdominal: Soft. Bowel sounds are normal. He exhibits no distension. There is abdominal tenderness (Appropriate tenderness).  Musculoskeletal: Normal range of motion.        General: No edema (Trivial).  Neurological: He is alert and oriented to person, place, and time.  Psychiatric: He has a normal mood and affect. His behavior is normal. Judgment and thought content normal.  Vitals reviewed.   Labs    Chemistry Recent  Labs  Lab 06/10/18 0245 06/11/18 0305  06/13/18 0411 06/14/18 0359 06/15/18 0416  NA 150* 149*   < > 147* 146* 144  K 3.2* 3.8   < > 3.8 3.6 3.9  CL 117* 118*   < > 112* 111 108  CO2 25 22   < > '29 27 27  '$ GLUCOSE 105* 139*   < > 101* 128* 129*  BUN 36* 39*   < > 36* 31* 27*  CREATININE 2.37* 2.42*   < > 2.28* 2.12* 1.91*  CALCIUM 7.5* 7.6*   < > 8.2* 8.2* 8.3*  PROT 5.0* 5.3*  --   --   --   --   ALBUMIN  --  2.4*  --   --   --   --   AST  --  20  --   --   --   --   ALT  --  18  --   --   --   --   ALKPHOS  --  56  --   --   --   --   BILITOT  --  0.2*  --   --   --   --   GFRNONAA 29* 28*   < > 30* 33* 37*  GFRAA 33* 32*   < > 35* 38* 43*  ANIONGAP 8 9   < > '6 8 9   '$ < > = values in this  interval not displayed.    Hematology Recent Labs  Lab 06/12/18 0358 06/13/18 0411 06/14/18 0359  WBC 10.9* 10.0 9.3  RBC 3.15* 3.08* 3.47*  HGB 9.3* 9.3* 10.3*  HCT 29.5* 28.9* 32.3*  MCV 93.7 93.8 93.1  MCH 29.5 30.2 29.7  MCHC 31.5 32.2 31.9  RDW 14.7 14.5 14.2  PLT 393 413* 440*   Cardiac Enzymes No results for input(s): TROPONINI in the last 168 hours. No results for input(s): TROPIPOC in the last 168 hours.   BNP No results for input(s): BNP, PROBNP in the last 168 hours.  DDimer No results for input(s): DDIMER in the last 168 hours.   Radiology    No results found. Cardiac Studies   TTE 06/03/2018 1. The left ventricle has normal systolic function with an ejection fraction of 60-65%. The cavity size was normal. There is mildly increased left ventricular wall thickness. Left ventricular diastolic Doppler parameters are consistent with impaired  relaxation No evidence of left ventricular regional wall motion abnormalities. 2. The right ventricle has normal systolic function. The cavity was normal. There is no increase in right ventricular wall thickness. Estimated RVSP 36.4 mmHg. 3. The pericardial effusion is posterior to the left ventricle. 4. Trivial pericardial effusion is present. 5. The aortic valve is tricuspid. Mild calcification of the aortic valve.. Mild to moderate aortic annular calcification noted. 6. The mitral valve is normal in structure. 7. The tricuspid valve is normal in structure. Tricuspid valve regurgitation is mild-moderate. 8. The aortic root is normal in size and structure. 9. Moderate pleural effusion in the left lateral region.  Status post left-sided thoracentesis on (Friday, February 28)  Patient Profile   Aaron Wang is a 63 y.o. male with a hx of HTN and esophageal cancer who is being seen for the evaluation of bradycardia.  Assessment & Plan    Sinus Bradycardia / Tachycardia  All episodes have been during sleeping  hours with sinus pauses and rates in the 30s.  Again lowest this morning still in the 40s  and asymptomatic  As most of these episodes of bradycardia are during hours of sleep and with awakening, suspect it may be related to sleep disordered breathing/apnea  With continued hypert but then negative ension and more tachycardia than bradycardia, will increase carvedilol to 620 5 in the morning and 12 point 5 in the afternoon.   no acute indication for pacemaker.  Accelerated hypertension (no further hypotension)  Renal artery Dopplers pending -preliminary read suggests no stenosis.  Trying to limit use of PRN's, especially hydralazine to avoid tachycardia. --1 dose of IV labetalol, I suspect labetalol may work better than hydralazine.  Now on 0.2 mg clonidine patch, Norvasc at 10  Increasing carvedilol to 6.2 5 in the morning and 12 point 5 in the evening..  As his renal function continues to improve, anticipate being on to restart his ARB which should help with blood pressure as well.  Acute renal failure  Creatinine seems improving, still not back to baseline.  Holding ARB for now..   Should be OK to transfer out of ICU.   For questions or updates, please contact [facility] Please consult [email or web address] for contact info under Cardiology/STEMI.   Signed, Glenetta Hew, MD  06/15/2018, 11:57 AM

## 2018-06-15 NOTE — Clinical Social Work Note (Addendum)
CSW met with pt at bedside. At this time [facility] (ALF) wants to come and do an assessment on pt in the hospital. Pt is agreeable. [facility] also suggest that pt be applied for special assistance medicaid in order to cover ALF. Pt is agreeable to CSW reaching out to stepdaughter Amanda and seeing if she is willing to work with DSS to get this taken care of. CSW to follow up with Amanda. Jasmine with [facility] notified that pt is agreeable to assessment.  Jasmine with [facility] to assess pt at 2:30.   3:44: Per Jasmine with [facility] pt would not qualify for ALF because he is "too independent", pt will not require any hours for help with ADL's. Jasmine states pt should be able to go into a SNF in a long term bed given the feeding tube. Jasmine also states ALF won't take a pt with a feeding tube. CSW to start SNF search and to determine if a SNF will take him being he is independent.  Bridget Cobb, LCSWA [phone]  

## 2018-06-15 NOTE — Progress Notes (Signed)
Patient ID: Aaron Wang, male   DOB: [birth year], 63 y.o.   MRN: [id number] [facility] DAILY ICU PROGRESS NOTE                   [address]            [place] [id number]          [phone]   17 Days Post-Op Procedure(s) (LRB): TRANSHIATAL TOTAL ESOPHAGECTOMY (N/A) PYLOROMYOTOMY (N/A) FEEDING JEJUNOSTOMY (N/A) CERVICOESOPHAGO GASTROSTOMY (Right) Flexible Bronchoscopy LEFT CHEST TUBE INSERTION (Left)  Total Length of Stay:  LOS: 17 days   Subjective: Awake alert respiratory status remains stable, eating Jell-O without difficulty  Objective: Vital signs in last 24 hours: Temp:  [97.9 F (36.6 C)-98.5 F (36.9 C)] 98.5 F (36.9 C) (03/05 0747) Pulse Rate:  [85-107] 94 (03/05 0700) Cardiac Rhythm: Sinus tachycardia (03/05 0600) Resp:  [16-26] 19 (03/05 0700) BP: (161-194)/(81-109) 179/93 (03/05 0700) SpO2:  [92 %-100 %] 96 % (03/05 0700)  Filed Weights   06/01/18 0600 06/05/18 0515 06/08/18 0615  Weight: 60.7 kg 59.8 kg 61.7 kg    Weight change:    Hemodynamic parameters for last 24 hours:    Intake/Output from previous day: 03/04 0701 - 03/05 0700 In: 1400 [P.O.:360; NG/GT:1040] Out: 500 [Urine:500]  Intake/Output this shift: Total I/O In: -  Out: 220 [Urine:220]  Current Meds: Scheduled Meds: . amLODipine  10 mg Oral Daily  . carvedilol  3.125 mg Oral QPM  . carvedilol  6.25 mg Oral q morning - 10a  . chlorhexidine gluconate (MEDLINE KIT)  15 mL Mouth Rinse BID  . Chlorhexidine Gluconate Cloth  6 each Topical Daily  . cloNIDine  0.1 mg Transdermal Weekly  . famotidine  20 mg Oral Daily  . feeding supplement (ENSURE ENLIVE)  237 mL Oral TID BM  . free water  100 mL Per Tube Q8H  . guaiFENesin  600 mg Oral BID  . pantoprazole (PROTONIX) IV  40 mg Intravenous Q12H  . sodium chloride flush  10-40 mL Intracatheter Q12H   Continuous Infusions: . feeding supplement (OSMOLITE 1.2 CAL) 1,000 mL (06/14/18 0617)   PRN Meds:.oxyCODONE **AND**  acetaminophen, diphenoxylate-atropine, hydrALAZINE, ondansetron (ZOFRAN) IV, potassium chloride, sodium chloride flush  General appearance: alert and cooperative Neurologic: intact Heart: regular rate and rhythm, S1, S2 normal, no murmur, click, rub or gallop Lungs: diminished breath sounds bibasilar Abdomen: soft, non-tender; bowel sounds normal; no masses,  no organomegaly Extremities: extremities normal, atraumatic, no cyanosis or edema and Homans sign is negative, no sign of DVT Wound: No drainage from left neck drain site  Lab Results: CBC: Recent Labs    06/13/18 0411 06/14/18 0359  WBC 10.0 9.3  HGB 9.3* 10.3*  HCT 28.9* 32.3*  PLT 413* 440*   BMET:  Recent Labs    06/14/18 0359 06/15/18 0416  NA 146* 144  K 3.6 3.9  CL 111 108  CO2 27 27  GLUCOSE 128* 129*  BUN 31* 27*  CREATININE 2.12* 1.91*  CALCIUM 8.2* 8.3*    CMET: Lab Results  Component Value Date   WBC 9.3 06/14/2018   HGB 10.3 (L) 06/14/2018   HCT 32.3 (L) 06/14/2018   PLT 440 (H) 06/14/2018   GLUCOSE 129 (H) 06/15/2018   CHOL 94 06/11/2018   TRIG 55 06/11/2018   HDL 27 (L) 06/11/2018   LDLDIRECT 112 (H) 11/26/2009   LDLCALC 56 06/11/2018   ALT 18 06/11/2018   AST 20 06/11/2018  NA 144 06/15/2018   K 3.9 06/15/2018   CL 108 06/15/2018   CREATININE 1.91 (H) 06/15/2018   BUN 27 (H) 06/15/2018   CO2 27 06/15/2018   TSH 0.925 11/26/2009   PSA 0.63 04/16/2010   INR 1.10 05/25/2018   MICROALBUR 0.50 01/01/2010      PT/INR: No results for input(s): LABPROT, INR in the last 72 hours. Radiology: No results found.   Assessment/Plan: S/P Procedure(s) (LRB): TRANSHIATAL TOTAL ESOPHAGECTOMY (N/A) PYLOROMYOTOMY (N/A) FEEDING JEJUNOSTOMY (N/A) CERVICOESOPHAGO GASTROSTOMY (Right) Flexible Bronchoscopy LEFT CHEST TUBE INSERTION (Left) Mobilize Blood pressure still difficult to control, on oral agents plus Catapres patch-cardiology following Obtain follow-up PA and lateral chest  x-ray Advance diet to soft Waiting for stepdown bed    [name] 06/15/2018 8:15 AM

## 2018-06-16 ENCOUNTER — Inpatient Hospital Stay (HOSPITAL_COMMUNITY): Payer: Medicaid Other

## 2018-06-16 ENCOUNTER — Encounter (HOSPITAL_COMMUNITY): Payer: Self-pay | Admitting: Radiology

## 2018-06-16 HISTORY — PX: IR THORACENTESIS ASP PLEURAL SPACE W/IMG GUIDE: IMG5380

## 2018-06-16 LAB — BASIC METABOLIC PANEL
Anion gap: 7 (ref 5–15)
BUN: 24 mg/dL — ABNORMAL HIGH (ref 6–20)
CO2: 27 mmol/L (ref 22–32)
Calcium: 8.4 mg/dL — ABNORMAL LOW (ref 8.9–10.3)
Chloride: 111 mmol/L (ref 98–111)
Creatinine, Ser: 1.77 mg/dL — ABNORMAL HIGH (ref 0.61–1.24)
GFR calc Af Amer: 47 mL/min — ABNORMAL LOW (ref >60)
GFR calc non Af Amer: 41 mL/min — ABNORMAL LOW (ref >60)
Glucose, Bld: 112 mg/dL — ABNORMAL HIGH (ref 70–99)
Potassium: 4 mmol/L (ref 3.5–5.1)
Sodium: 145 mmol/L (ref 135–145)

## 2018-06-16 LAB — PROTEIN, PLEURAL OR PERITONEAL FLUID

## 2018-06-16 MED ORDER — LABETALOL HCL 5 MG/ML IV SOLN
20.0000 mg | INTRAVENOUS | Status: DC | PRN
  Administered 2018-06-16 – 2018-06-20 (×15): 20 mg via INTRAVENOUS
  Filled 2018-06-16 (×18): qty 4

## 2018-06-16 MED ORDER — LIDOCAINE HCL 1 % IJ SOLN
INTRAMUSCULAR | Status: AC
  Filled 2018-06-16: qty 20

## 2018-06-16 MED ORDER — LIDOCAINE HCL 1 % IJ SOLN
INTRAMUSCULAR | Status: DC | PRN
  Administered 2018-06-16: 10 mL

## 2018-06-16 NOTE — Discharge Summary (Addendum)
Physician Discharge Summary       [address]       [place] [address]             [phone]    Patient ID: Aaron Wang MRN: [id number] DOB/AGE: June 02, 1955 63 y.o.  Admit date: 05/29/2018 Discharge date: 07/04/2018  Admission Diagnoses: Esophageal cancer-squamous cell [facility])  Discharge Diagnoses:  1. S/p transhiatal total esophagectomy 2. Malnutrition of moderate degree 3. Cardiac arrest, cause unspecified ([place]) 4. Acute respiratory failure with hypoxia (H[facility]) 5. Aspiration pneumonia ([place]) 6. Protein-calorie malnutrition, severe 7. Acute kidney injury 8. Left and right pleural effusions 9. History of hypertension 10. History of GERD(gastroesophageal reflux disease) 11. History of carpal tunnel syndrome 12. History of back pain 13. History of torn ligaments left knee 14. History of tobacco abuse  Consults: Critical care, cardiology, and IR  Procedure (s):  Bronchoscopy, transhiatal total esophagectomy with cervical esophagogastrostomy, pyloromyotomy, feeding jejunostomy tube, placement of left chest tube by Dr. Servando Snare on 05/29/2018. Placement of left PleurX catheter with fluoroscopic and ultrasound guidance and drainage of left pleural effusion by Dr. Servando Snare on 06/19/2018. Right thoracentesis y IR on 06/28/2018  Pathology: 1. Omentum, biopsy - PAPILLARY MESOTHELIAL HYPERPLASIA. - NO CARCINOMA IDENTIFIED. 2. Lymph node, biopsy, omental - NO CARCINOMA IDENTIFIED IN ONE LYMPH NODE (0/1). 3. Lymph node, biopsy, omental #2 - NO CARCINOMA IDENTIFIED IN ONE LYMPH NODE (0/1). 4. Esophagus, resection, with GE junction lymph nodes - INVASIVE SQUAMOUS CELL CARCINOMA, 5 CM. - TUMOR INVADES THE MUSCULARIS PROPRIA. - MARGINS OF RESECTION ARE NOT INVOLVED. - NO CARCINOMA IDENTIFIED IN THIRTEEN LYMPH NODES (0/13). - SEE ONCOLOGY TABLE. 5. Lymph node, biopsy, paraesophageal - NO CARCINOMA IDENTIFIED IN ONE LYMPH NODE (0/1). 6. Lymph node, biopsy,  paraesophageal #2 - NO CARCINOMA IDENTIFIED IN ONE LYMPH NODE (0/1). 7. Lymph node, biopsy, mesentery - PANCREATIC TISSUE. - NO NODAL TISSUE IDENTIFIED. - NO CARCINOMA IDENTIFIED. TNM Code: pT2, pN0  Left Pleural Fluid: PLEURAL FLUID, LEFT (SPECIMEN 1 OF 1, COLLECTED ON 06/09/2018): REACTIVE MESOTHELIAL CELLS. NO MALIGNANT CELLS IDENTIFIED.  History of Presenting Illness: This is a 63 y.o. male  Has been  followed in the office for squamous cell carcinoma of the esophagus.  Several months ago the patient noted increasing episodes of painful swallowing.  He underwent upper GI endoscopy PET scan and EUS. He notes that he has lost approximately 6 pounds over the last 6 months.  He is a long-term smoker and is continuing to smoke.  He has significantly cut down on his smoking now just a few cigarettes a day.  MRI has been completed to rule out the possibility of liver metastasis, and there is no evidence of liver metastasis.    He has  concernsare about a place to live after surgery, he notes that for the past 3 years he has been living in the [facility] [id number] and staying in room [address].  He is been seen by social work to assist in arranging for home care after discharge.  He discussed with the patient proceeding with total esophagectomy, possible transhiatal but with low threshold to proceed with abdominal right chest and left neck incisions.   Dr. Servando Snare had a detailed discussion with Aaron Wang regarding the magnitude of the surgical esophagectomy procedure as well as the risks, the expected benefits, and alternatives.  Dr. Servando Snare quoted Aaron Wang 5% perioperative mortality rate and a complication rate as high as 40%.  They specifically discussed complications, which  include, but were not limited to: recurrent nerve injury with possible permanent hoarseness, anastomotic leak, airway and great vessel injury, conduit ischemia, thoracic duct leak, the inability to complete the  operation via a transhiatal approach requiring a right thoracotomy,  bleeding, need for blood transfusion and the potential need for ventilator support.  Aaron Wang has had questions answered is well informed and willing to proceed.  Patient was seen by cardiology stress test was performed, patient cleared to proceed with esophagectomy from a cardiology standpoint. Patient was admitted on 02/17 in order to undergo a bronchoscopy, transhiatal total esophagectomy with cervical esophagogastrostomy, pyloromyotomy, feeding jejunostomy tube, placement of left chest tube.    Brief Hospital Course:  He was initially extubated late the evening of surgery. A line and foley were removed early in his post operative course. Left chest tube output decreased and was removed on 02/19. Tube feedings were started very slowly on 02/19. He was hypertensive (has a history). He was restarted on both Lopressor (via tube) and Catapres patch. He developed acute respiratory failure with bilateral LE edema. He was treated with Lasix and put on antibiotics (Vancomycin and Zosyn for possible HCAP). Blood cultures were negative. Echo done on 02/22 showed LVEF 60-65%, moderate left pleural effusion, pericardial effusion, and no significant valvular anomalities. Critical care was consulted and patient required re intubation. Patient became agitated and given Versed and was put in restraints. Order given to advance NG tube (not by Dr. Servando Snare). Patient's respiratory status improved and he was extubated on 02/23. Patient began to desat again and required intubation on 02/23. Patient then had asystole after lying flat in order to be intubated. CPR was initiated, given Atropine, and had ROSC after 2 minutes. Patient then had episodes of bradycardia and tachycardia. Cardiology was consulted. He developed acute kidney injury. His creatinine went as high as 3.15. Patient was started on Diltiazem drip for hypertension. Patient was extubated  on 2/25. NG tube was removed. Patient again developed respiratory distress and was put on BIPAP. Critical care continued to follow ant treat patient. Patient underwent a left thoracentesis on 02/28 and 1.8 liters of milky yellow fluid was removed. Gradually, respiratory status improved. Swallow study showed narrowing at the gastric outlet, no evidence of aspiration. Because blood pressure was difficult to control and elevated creatinine, renal US was done on 03/03 and showed no evidence of renal artery stenosis. As of 03/08, his creatinine was down to 1.49. Diet as was advanced as tolerated and tube feedings continued. Patient was tolerating a soft diet. All wounds are clean, dry, and continuing to heal. Patient had a recurrent left pleural effusion. He underwent a second thoracentesis on 03/06 and 1.5 liters of chylous fluid was removed.  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 Left Pleur X was drained daily. IR was then consulted regarding embolization of the thoracic duct. He underwent fenestration of the thoracic ducts on 03/14. He also had a persistent right pleural effusion. IR was again consulted for right thoracentesis. This was performed on 03/18.600 ml of amber fluid was removed. Daily drainage of left Pleur X catheter showed decreased output. It was last drained on 03/24 and had 50 ml removed. He was instructed to drain left Pleur X every other day, starting Thursday 03/26 and record output. He was instructed to bring record with him to office appointment next week. Because the patient was eating more, nightly tube feedings were stopped on 03/23. Patient was  instructed on how to drain both the left Pleur X and flush the J tube. He was instructed to flush J tube with 10 mL of free water in the morning and night and change the dressing around the J tube daily. Patient was given supplies for daily dressing changes. As discussed with Dr. Servando Snare,  patient is felt surgically stable for discharge today.  Latest Vital Signs: Blood pressure 123/80, pulse 79, temperature 98 F (36.7 C), temperature source Oral, resp. rate 19, height 5\' 7"  (1.702 m), weight 50.3 kg, SpO2 99 %.  Physical Exam: Cardiovascular: RRR Pulmonary: Mostly clear Abdomen: Soft, non tender, bowel sounds present. Extremities: No LE edema Wounds: Clean and dry.  No erythema or signs of infection.  J tube site clean and dry and suture intact  Discharge Condition: Stable and discharged to home.  Recent laboratory studies:  Lab Results  Component Value Date   WBC 4.4 07/04/2018   HGB 9.1 (L) 07/04/2018   HCT 28.4 (L) 07/04/2018   MCV 89.9 07/04/2018   PLT 290 07/04/2018   Lab Results  Component Value Date   NA 132 (L) 07/04/2018   K 4.0 07/04/2018   CL 100 07/04/2018   CO2 26 07/04/2018   CREATININE 0.74 07/04/2018   GLUCOSE 104 (H) 07/04/2018     Diagnostic Studies:  Dg Chest 2 View  Result Date: 07/03/2018 CLINICAL DATA:  Pleural effusion. EXAM: CHEST - 2 VIEW COMPARISON:  Radiograph June 28, 2018. FINDINGS: Stable cardiomediastinal silhouette. Stable left infrahilar calcified lymph nodes are noted. Right-sided PICC line is unchanged in position. Left-sided chest tube is unchanged in position. Minimal right pleural effusion is noted which is decreased compared to prior exam. Minimal left pleural effusion is noted as well. Bony thorax is unremarkable. IMPRESSION: Improved right pleural effusion is noted compared to prior exam. Stable position of left-sided chest tube with minimal left pleural effusion. Electronically Signed   By: Marijo Conception, M.D.   On: 07/03/2018 12:08    Dg Abd 1 View  Result Date: 06/18/2018 CLINICAL DATA:  Abdominal pain EXAM: ABDOMEN - 1 VIEW COMPARISON:  06/03/2018 abdominal radiograph FINDINGS: Catheter overlies the left lower quadrant. Surgical clips overlie the left upper quadrant. No dilated small bowel loops. Mild stool and  gas in the large bowel. No evidence of pneumatosis or pneumoperitoneum. Left greater than right pleural effusions at the lung bases. No radiopaque nephrolithiasis. IMPRESSION: 1. Nonobstructive bowel gas pattern. 2. Left greater than right pleural effusions at the lung bases. Electronically Signed   By: Ilona Sorrel M.D.   On: 06/18/2018 15:54   Ir Veno/ext/uni Left  Result Date: 06/24/2018 INDICATION: 63 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 EXAM: 1. Ultrasound-guided needle placement right inguinal lymph node 2. Ultrasound-guided needle placement left inguinal lymph node 3. Ultrasound-guided vascular access left brachial vein 4. Bilateral abdomen and pelvis lymphangiogram 5. Catheterization of the left subclavian vein 6. Left subclavian venogram COMPARISON:  None. MEDICATIONS: None. ANESTHESIA/SEDATION: General anesthesia FLUOROSCOPY TIME:  Fluoroscopy Time: 52 minutes 48 seconds (1166 mGy). COMPLICATIONS: SIR Level A - No therapy, no consequence. TECHNIQUE: Informed written consent was obtained from the patient after a thorough discussion of the procedural risks, benefits and alternatives. All questions were addressed. Maximal Sterile Barrier Technique was utilized including caps, mask, sterile gowns, sterile gloves, sterile drape, hand hygiene and skin antiseptic. A timeout was performed prior to the initiation of the procedure. Ultrasound was used to interrogate the right inguinal region. Multiple small lymph  nodes were identified. A suitable lymph node with a visible hilum was identified. Under real-time sonographic guidance, a 25 gauge spinal needle was carefully advanced into the hilum of the lymph node. Ultrasound was then used to interrogate the left inguinal region. Again, multiple small lymph nodes were identified. Suitable lymph node with a visible hilum was identified. Under real-time sonographic guidance, a 25 gauge spinal needle was carefully advanced into  the hilum of the lymph node. Lipiodol was then slowly and gently injected through both 25 gauge spinal needles using a combination of a 12 mL syringe and IV connecting tubing. A total of 10-15 mL Lipiodol was administered between the 2 sides. While the Lipiodol was slowly Pr cleaning up the lymphatic system, attention was turned to the left upper extremity. The left brachial vein was interrogated with ultrasound and found to be widely patent. An image was obtained and stored for the medical record. Local anesthesia was attained by infiltration with 1% lidocaine. A small dermatotomy was made. Under real-time sonographic guidance, the vessel was punctured with a 21 gauge micropuncture needle. Using standard technique, the initial micro needle was exchanged over a 0.018 micro wire for a transitional 4 [place] micro sheath. The micro sheath was then exchanged over a 0.035 wire for a 5 French vascular sheath. A C2 cobra catheter was then advanced over a Bentson wire into the left subclavian vein. Several minutes were spent attempting to cannulate the thoracic duct from the venous side. This was ultimately unsuccessful. Multiple radiographs were obtained. The pelvic nodal chain is unremarkable. In the abdomen, in the typical location of the cisterna chyli there are instead several lymph nodes with numerous small connecting channels. Several of the channels demonstrated cephalad movement of small bits of Lipiodol. Therefore, attempts were made to catheterize these relatively small channels using a combination of 22 gauge Chiba and 21 gauge trocar needles. On at least 2 occasions, we successfully punctured the very small 1 mm ducts and advanced a 014 Transcend wire several cm into the ducts. Unfortunately, the wire would not pass beyond the lymph node in the mid epigastric region. Attempts at navigate in a microcatheter into the ductal system or unsuccessful secondary to lack of adequate wire purchase. At 1 point in time, a  small portion of the tip of a Transcend wire was sheared off and is located within the right retroperitoneal space in a nonvascular structure. Additional imaging of the chest demonstrates successful visualization of the thoracic duct in the mid and upper chest. Unfortunately, the more distal thoracic duct is not visible and the thoracic duct where it enters the subclavian vein is not visible. The visualized portions of the thoracic duct are not accessible percutaneously. FINDINGS: Successful lymphangiogram and partial fenestration of small lymphatic ducts in the mid epigastric region. Unfortunately, the thoracic duct is not accessible percutaneously. Unsuccessful attempted catheterization of the thoracic duct from the subclavian vein. IMPRESSION: Successful lymphangiogram with fenestration of small lymphatic ducts in the mid epigastric region. Unsuccessful thoracic duct embolization secondary to patient anatomy. Signed, Criselda Peaches, MD, RPVI and Linus Mako. Pascal Lux, MD [facility] Electronically Signed   By: Jacqulynn Cadet M.D.   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 EXAM: 1. Ultrasound-guided needle placement right inguinal lymph node 2. Ultrasound-guided needle placement left inguinal lymph node 3. Ultrasound-guided vascular access left brachial vein 4.  Bilateral abdomen and pelvis lymphangiogram 5. Catheterization of the left subclavian vein 6. Left subclavian venogram COMPARISON:  None. MEDICATIONS: None. ANESTHESIA/SEDATION: General anesthesia FLUOROSCOPY TIME:  Fluoroscopy Time: 52 minutes 48 seconds (1166 mGy). COMPLICATIONS: SIR Level A - No therapy, no consequence. TECHNIQUE: Informed written consent was obtained from the patient after a thorough discussion of the procedural risks, benefits and alternatives. All  questions were addressed. Maximal Sterile Barrier Technique was utilized including caps, mask, sterile gowns, sterile gloves, sterile drape, hand hygiene and skin antiseptic. A timeout was performed prior to the initiation of the procedure. Ultrasound was used to interrogate the right inguinal region. Multiple small lymph nodes were identified. A suitable lymph node with a visible hilum was identified. Under real-time sonographic guidance, a 25 gauge spinal needle was carefully advanced into the hilum of the lymph node. Ultrasound was then used to interrogate the left inguinal region. Again, multiple small lymph nodes were identified. Suitable lymph node with a visible hilum was identified. Under real-time sonographic guidance, a 25 gauge spinal needle was carefully advanced into the hilum of the lymph node. Lipiodol was then slowly and gently injected through both 25 gauge spinal needles using a combination of a 12 mL syringe and IV connecting tubing. A total of 10-15 mL Lipiodol was administered between the 2 sides. While the Lipiodol was slowly Pr cleaning up the lymphatic system, attention was turned to the left upper extremity. The left brachial vein was interrogated with ultrasound and found to be widely patent. An image was obtained and stored for the medical record. Local anesthesia was attained by infiltration with 1% lidocaine. A small dermatotomy was made. Under real-time sonographic guidance, the vessel was punctured with a 21 gauge micropuncture needle. Using standard technique, the initial micro needle was exchanged over a 0.018 micro wire for a transitional 4 [place] micro sheath. The micro sheath was then exchanged over a 0.035 wire for a 5 French vascular sheath. A C2 cobra catheter was then advanced over a Bentson wire into the left subclavian vein. Several minutes were spent attempting to cannulate the thoracic duct from the venous side. This was ultimately unsuccessful. Multiple radiographs were  obtained. The pelvic nodal chain is unremarkable. In the abdomen, in the typical location of the cisterna chyli there are instead several lymph nodes with numerous small connecting channels. Several of the channels demonstrated cephalad movement of small bits of Lipiodol. Therefore, attempts were made to catheterize these relatively small channels using a combination of 22 gauge Chiba and 21 gauge trocar needles. On at least 2 occasions, we successfully punctured the very small 1 mm ducts and advanced a 014 Transcend wire several cm into the ducts. Unfortunately, the wire would not pass beyond the lymph node in the mid epigastric region. Attempts at navigate in a microcatheter into the ductal system or unsuccessful secondary to lack of adequate wire purchase. At 1 point in time, a small portion of the tip of a Transcend wire was sheared off and is located within the right retroperitoneal space in a nonvascular structure. Additional imaging of the chest demonstrates successful visualization of the thoracic duct in the mid and upper chest. Unfortunately, the more distal thoracic duct is not visible and the thoracic duct where it enters the subclavian vein is not visible. The visualized portions of the thoracic duct are not accessible percutaneously. FINDINGS: Successful lymphangiogram and partial fenestration of small lymphatic ducts in the mid epigastric region. Unfortunately, the thoracic duct is not accessible percutaneously. Unsuccessful attempted  catheterization of the thoracic duct from the subclavian vein. IMPRESSION: Successful lymphangiogram with fenestration of small lymphatic ducts in the mid epigastric region. Unsuccessful thoracic duct embolization secondary to patient anatomy. Signed, Criselda Peaches, MD, RPVI and Linus Mako. Pascal Lux, MD [facility] Electronically Signed   By: Jacqulynn Cadet M.D.   On: 06/24/2018 13:25   Ir US Guide Bx  Asp/drain  Result Date: 06/24/2018 INDICATION: 63 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 EXAM: 1. Ultrasound-guided needle placement right inguinal lymph node 2. Ultrasound-guided needle placement left inguinal lymph node 3. Ultrasound-guided vascular access left brachial vein 4. Bilateral abdomen and pelvis lymphangiogram 5. Catheterization of the left subclavian vein 6. Left subclavian venogram COMPARISON:  None. MEDICATIONS: None. ANESTHESIA/SEDATION: General anesthesia FLUOROSCOPY TIME:  Fluoroscopy Time: 52 minutes 48 seconds (1166 mGy). COMPLICATIONS: SIR Level A - No therapy, no consequence. TECHNIQUE: Informed written consent was obtained from the patient after a thorough discussion of the procedural risks, benefits and alternatives. All questions were addressed. Maximal Sterile Barrier Technique was utilized including caps, mask, sterile gowns, sterile gloves, sterile drape, hand hygiene and skin antiseptic. A timeout was performed prior to the initiation of the procedure. Ultrasound was used to interrogate the right inguinal region. Multiple small lymph nodes were identified. A suitable lymph node with a visible hilum was identified. Under real-time sonographic guidance, a 25 gauge spinal needle was carefully advanced into the hilum of the lymph node. Ultrasound was then used to interrogate the left inguinal region. Again, multiple small lymph nodes were identified. Suitable lymph node with a visible hilum was identified. Under real-time sonographic guidance, a 25 gauge spinal needle was carefully advanced into the hilum of the lymph node. Lipiodol was then slowly and gently injected through both 25 gauge spinal needles using a combination of a 12 mL syringe and IV connecting tubing. A total of 10-15 mL Lipiodol was administered between the 2 sides. While the Lipiodol was slowly Pr cleaning up the lymphatic system, attention was turned to the left upper extremity. The  left brachial vein was interrogated with ultrasound and found to be widely patent. An image was obtained and stored for the medical record. Local anesthesia was attained by infiltration with 1% lidocaine. A small dermatotomy was made. Under real-time sonographic guidance, the vessel was punctured with a 21 gauge micropuncture needle. Using standard technique, the initial micro needle was exchanged over a 0.018 micro wire for a transitional 4 [place] micro sheath. The micro sheath was then exchanged over a 0.035 wire for a 5 French vascular sheath. A C2 cobra catheter was then advanced over a Bentson wire into the left subclavian vein. Several minutes were spent attempting to cannulate the thoracic duct from the venous side. This was ultimately unsuccessful. Multiple radiographs were obtained. The pelvic nodal chain is unremarkable. In the abdomen, in the typical location of the cisterna chyli there are instead several lymph nodes with numerous small connecting channels. Several of the channels demonstrated cephalad movement of small bits of Lipiodol. Therefore, attempts were made to catheterize these relatively small channels using a combination of 22 gauge Chiba and 21 gauge trocar needles. On at least 2 occasions, we successfully punctured the very small 1 mm ducts and advanced a 014 Transcend wire several cm into the ducts. Unfortunately, the wire would not pass beyond the lymph node in the mid epigastric region. Attempts at navigate in a microcatheter into the ductal system or unsuccessful secondary to lack  of adequate wire purchase. At 1 point in time, a small portion of the tip of a Transcend wire was sheared off and is located within the right retroperitoneal space in a nonvascular structure. Additional imaging of the chest demonstrates successful visualization of the thoracic duct in the mid and upper chest. Unfortunately, the more distal thoracic duct is not visible and the thoracic duct where it enters the  subclavian vein is not visible. The visualized portions of the thoracic duct are not accessible percutaneously. FINDINGS: Successful lymphangiogram and partial fenestration of small lymphatic ducts in the mid epigastric region. Unfortunately, the thoracic duct is not accessible percutaneously. Unsuccessful attempted catheterization of the thoracic duct from the subclavian vein. IMPRESSION: Successful lymphangiogram with fenestration of small lymphatic ducts in the mid epigastric region. Unsuccessful thoracic duct embolization secondary to patient anatomy. Signed, Criselda Peaches, MD, RPVI and Linus Mako. Pascal Lux, MD [facility] Radiology Electronically Signed   By: Jacqulynn Cadet M.D.   On: 06/24/2018 13:25   Ir US Guide Bx Asp/drain  Result Date: 06/24/2018 INDICATION: 63 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 EXAM: 1. Ultrasound-guided needle placement right inguinal lymph node 2. Ultrasound-guided needle placement left inguinal lymph node 3. Ultrasound-guided vascular access left brachial vein 4. Bilateral abdomen and pelvis lymphangiogram 5. Catheterization of the left subclavian vein 6. Left subclavian venogram COMPARISON:  None. MEDICATIONS: None. ANESTHESIA/SEDATION: General anesthesia FLUOROSCOPY TIME:  Fluoroscopy Time: 52 minutes 48 seconds (1166 mGy). COMPLICATIONS: SIR Level A - No therapy, no consequence. TECHNIQUE: Informed written consent was obtained from the patient after a thorough discussion of the procedural risks, benefits and alternatives. All questions were addressed. Maximal Sterile Barrier Technique was utilized including caps, mask, sterile gowns, sterile gloves, sterile drape, hand hygiene and skin antiseptic. A timeout was performed prior to the initiation of the procedure. Ultrasound was used to interrogate the right inguinal region. Multiple small lymph nodes were identified. A suitable lymph node  with a visible hilum was identified. Under real-time sonographic guidance, a 25 gauge spinal needle was carefully advanced into the hilum of the lymph node. Ultrasound was then used to interrogate the left inguinal region. Again, multiple small lymph nodes were identified. Suitable lymph node with a visible hilum was identified. Under real-time sonographic guidance, a 25 gauge spinal needle was carefully advanced into the hilum of the lymph node. Lipiodol was then slowly and gently injected through both 25 gauge spinal needles using a combination of a 12 mL syringe and IV connecting tubing. A total of 10-15 mL Lipiodol was administered between the 2 sides. While the Lipiodol was slowly Pr cleaning up the lymphatic system, attention was turned to the left upper extremity. The left brachial vein was interrogated with ultrasound and found to be widely patent. An image was obtained and stored for the medical record. Local anesthesia was attained by infiltration with 1% lidocaine. A small dermatotomy was made. Under real-time sonographic guidance, the vessel was punctured with a 21 gauge micropuncture needle. Using standard technique, the initial micro needle was exchanged over a 0.018 micro wire for a transitional 4 [place] micro sheath. The micro sheath was then exchanged over a 0.035 wire for a 5 French vascular sheath. A C2 cobra catheter was then advanced over a Bentson wire into the left subclavian vein. Several minutes were spent attempting to cannulate the thoracic duct from the venous side. This was ultimately unsuccessful. Multiple radiographs were obtained. The pelvic nodal chain is unremarkable. In  the abdomen, in the typical location of the cisterna chyli there are instead several lymph nodes with numerous small connecting channels. Several of the channels demonstrated cephalad movement of small bits of Lipiodol. Therefore, attempts were made to catheterize these relatively small channels using a combination  of 22 gauge Chiba and 21 gauge trocar needles. On at least 2 occasions, we successfully punctured the very small 1 mm ducts and advanced a 014 Transcend wire several cm into the ducts. Unfortunately, the wire would not pass beyond the lymph node in the mid epigastric region. Attempts at navigate in a microcatheter into the ductal system or unsuccessful secondary to lack of adequate wire purchase. At 1 point in time, a small portion of the tip of a Transcend wire was sheared off and is located within the right retroperitoneal space in a nonvascular structure. Additional imaging of the chest demonstrates successful visualization of the thoracic duct in the mid and upper chest. Unfortunately, the more distal thoracic duct is not visible and the thoracic duct where it enters the subclavian vein is not visible. The visualized portions of the thoracic duct are not accessible percutaneously. FINDINGS: Successful lymphangiogram and partial fenestration of small lymphatic ducts in the mid epigastric region. Unfortunately, the thoracic duct is not accessible percutaneously. Unsuccessful attempted catheterization of the thoracic duct from the subclavian vein. IMPRESSION: Successful lymphangiogram with fenestration of small lymphatic ducts in the mid epigastric region. Unsuccessful thoracic duct embolization secondary to patient anatomy. Signed, Criselda Peaches, MD, RPVI and Linus Mako. Pascal Lux, MD [facility]ascular [facility] Interventional Radiology Specialists [facility] Radiology Electronically Signed   By: Jacqulynn Cadet M.D.   On: 06/24/2018 13:25   Dg C-arm 1-60 Min-no Report  Result Date: 06/19/2018 Fluoroscopy was utilized by the requesting physician.  No radiographic interpretation.   Ir Lymphangiogram Pel/abd Bilat  Result Date: 06/24/2018 INDICATION: 63 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 EXAM: 1. Ultrasound-guided needle placement right inguinal lymph node 2. Ultrasound-guided needle  placement left inguinal lymph node 3. Ultrasound-guided vascular access left brachial vein 4. Bilateral abdomen and pelvis lymphangiogram 5. Catheterization of the left subclavian vein 6. Left subclavian venogram COMPARISON:  None. MEDICATIONS: None. ANESTHESIA/SEDATION: General anesthesia FLUOROSCOPY TIME:  Fluoroscopy Time: 52 minutes 48 seconds (1166 mGy). COMPLICATIONS: SIR Level A - No therapy, no consequence. TECHNIQUE: Informed written consent was obtained from the patient after a thorough discussion of the procedural risks, benefits and alternatives. All questions were addressed. Maximal Sterile Barrier Technique was utilized including caps, mask, sterile gowns, sterile gloves, sterile drape, hand hygiene and skin antiseptic. A timeout was performed prior to the initiation of the procedure. Ultrasound was used to interrogate the right inguinal region. Multiple small lymph nodes were identified. A suitable lymph node with a visible hilum was identified. Under real-time sonographic guidance, a 25 gauge spinal needle was carefully advanced into the hilum of the lymph node. Ultrasound was then used to interrogate the left inguinal region. Again, multiple small lymph nodes were identified. Suitable lymph node with a visible hilum was identified. Under real-time sonographic guidance, a 25 gauge spinal needle was carefully advanced into the hilum of the lymph node. Lipiodol was then slowly and gently injected through both 25 gauge spinal needles using a combination of a 12 mL syringe and IV connecting tubing. A total of 10-15 mL Lipiodol was administered between the 2 sides. While the Lipiodol was slowly Pr cleaning up the lymphatic system, attention was turned to the left upper extremity. The left brachial  vein was interrogated with ultrasound and found to be widely patent. An image was obtained and stored for the medical record. Local anesthesia was attained by infiltration with 1% lidocaine. A small dermatotomy  was made. Under real-time sonographic guidance, the vessel was punctured with a 21 gauge micropuncture needle. Using standard technique, the initial micro needle was exchanged over a 0.018 micro wire for a transitional 4 [place] micro sheath. The micro sheath was then exchanged over a 0.035 wire for a 5 French vascular sheath. A C2 cobra catheter was then advanced over a Bentson wire into the left subclavian vein. Several minutes were spent attempting to cannulate the thoracic duct from the venous side. This was ultimately unsuccessful. Multiple radiographs were obtained. The pelvic nodal chain is unremarkable. In the abdomen, in the typical location of the cisterna chyli there are instead several lymph nodes with numerous small connecting channels. Several of the channels demonstrated cephalad movement of small bits of Lipiodol. Therefore, attempts were made to catheterize these relatively small channels using a combination of 22 gauge Chiba and 21 gauge trocar needles. On at least 2 occasions, we successfully punctured the very small 1 mm ducts and advanced a 014 Transcend wire several cm into the ducts. Unfortunately, the wire would not pass beyond the lymph node in the mid epigastric region. Attempts at navigate in a microcatheter into the ductal system or unsuccessful secondary to lack of adequate wire purchase. At 1 point in time, a small portion of the tip of a Transcend wire was sheared off and is located within the right retroperitoneal space in a nonvascular structure. Additional imaging of the chest demonstrates successful visualization of the thoracic duct in the mid and upper chest. Unfortunately, the more distal thoracic duct is not visible and the thoracic duct where it enters the subclavian vein is not visible. The visualized portions of the thoracic duct are not accessible percutaneously. FINDINGS: Successful lymphangiogram and partial fenestration of small lymphatic ducts in the mid epigastric  region. Unfortunately, the thoracic duct is not accessible percutaneously. Unsuccessful attempted catheterization of the thoracic duct from the subclavian vein. IMPRESSION: Successful lymphangiogram with fenestration of small lymphatic ducts in the mid epigastric region. Unsuccessful thoracic duct embolization secondary to patient anatomy. Signed, Criselda Peaches, MD, RPVI and Linus Mako. Pascal Lux, MD Vascular and Interventional Radiology Specialists [facility] Radiology Electronically Signed   By: Jacqulynn Cadet M.D.   On: 06/24/2018 13:25   Vas US Renal Artery Duplex  Result Date: 06/13/2018 ABDOMINAL VISCERAL High Risk Factors: Hypertension. Performing Technologist: June Leap RDMS, RVT  Examination Guidelines: A complete evaluation includes B-mode imaging, spectral Doppler, color Doppler, and power Doppler as needed of all accessible portions of each vessel. Bilateral testing is considered an integral part of a complete examination. Limited examinations for reoccurring indications may be performed as noted.  Duplex Findings: +--------------------+--------+--------+------+--------+ Mesenteric          PSV cm/sEDV cm/sPlaqueComments +--------------------+--------+--------+------+--------+ Aorta at SMA           52      10                  +--------------------+--------+--------+------+--------+ Celiac Artery Origin  219      55                  +--------------------+--------+--------+------+--------+ SMA Proximal           93      9                   +--------------------+--------+--------+------+--------+  +------------------+--------+--------+-------+  Right Renal ArteryPSV cm/sEDV cm/sComment +------------------+--------+--------+-------+ Origin               63      8            +------------------+--------+--------+-------+ Proximal             54      9            +------------------+--------+--------+-------+ Mid                  56      7             +------------------+--------+--------+-------+ Distal               64      6            +------------------+--------+--------+-------+ +-----------------+--------+--------+-------+ Left Renal ArteryPSV cm/sEDV cm/sComment +-----------------+--------+--------+-------+ Origin              56      10           +-----------------+--------+--------+-------+ Proximal            77      12           +-----------------+--------+--------+-------+ Mid                 61      10           +-----------------+--------+--------+-------+ Distal              64      9            +-----------------+--------+--------+-------+ Technologist observations: Right lobe liver hyperechoic lesion consistent with a hemangioma. Bilateral pleural effusions noted. Bilateral kidney atypical cortical appearance with dilated collecting ducts. +------------+--------+--------+----+-----------+--------+--------+----+ Right KidneyPSV cm/sEDV cm/sRI  Left KidneyPSV cm/sEDV cm/sRI   +------------+--------+--------+----+-----------+--------+--------+----+ Upper Pole  27      5       0.82Upper Pole 27      8       0.72 +------------+--------+--------+----+-----------+--------+--------+----+ Mid         32      9       0.72Mid        23      5       0.78 +------------+--------+--------+----+-----------+--------+--------+----+ Lower Pole  28      4       0.85Lower Pole 37      7       0.81 +------------+--------+--------+----+-----------+--------+--------+----+ Hilar       70      9       0.87Hilar      64      12      0.81 +------------+--------+--------+----+-----------+--------+--------+----+ +------------------+-----+------------------+-----+ Right Kidney           Left Kidney             +------------------+-----+------------------+-----+ RAR                    RAR                     +------------------+-----+------------------+-----+ RAR (manual)           RAR  (manual)            +------------------+-----+------------------+-----+ Cortex                 Cortex                  +------------------+-----+------------------+-----+  Cortex thickness       Corex thickness         +------------------+-----+------------------+-----+ Kidney length (cm)11.43Kidney length (cm)11.39 +------------------+-----+------------------+-----+  Summary: Renal:  Right: No evidence of right renal artery stenosis. Abnormal right        Resistive Index. Left:  No evidence of left renal artery stenosis. Abnormal left        Resisitve Index.  *See table(s) above for measurements and observations.  Diagnosing physician: Deitra Mayo MD  Electronically signed by Deitra Mayo MD on 06/13/2018 at 3:59:20 PM.    Final    Dg Esophagus W Single Cm (sol Or Thin Ba)  Addendum Date: 06/12/2018   ADDENDUM REPORT: 06/12/2018 09:57 ADDENDUM: Discussed patient with Dr. Servando Snare. The patient has a proximal anastomosis at the thoracic inlet. No evidence of leak at the proximal anastomosis. The patient does not have a distal gastric anastomosis (gastrojejunostomy). Narrowing at the gastric outlet likely reflects postsurgical change/edema related to pyloromyotomy. However, this remains patent. No evidence of aspiration. Electronically Signed   By: Julian Hy M.D.   On: 06/12/2018 09:57   Result Date: 06/12/2018 CLINICAL DATA:  Postop esophagectomy EXAM: ESOPHOGRAM/BARIUM SWALLOW TECHNIQUE: Single contrast examination was performed using  thin barium. FLUOROSCOPY TIME:  Fluoroscopy Time:  48 seconds Radiation Exposure Index (if provided by the fluoroscopic device): 11.8 mGy Number of Acquired Spot Images: 8 COMPARISON:  None. FINDINGS: Status post esophagectomy with gastric pull-through. Patent gastrojejunostomy. Anastomosis is mildly narrowed, although contrast freely flows into small bowel. No evidence of leak. IMPRESSION: Status post esophagectomy with gastric pull-through.  Patent gastrojejunostomy. No evidence of leak. Electronically Signed: By: Julian Hy M.D. On: 06/12/2018 08:30   Ir Thoracentesis Asp Pleural Space W/img Guide  Result Date: 06/28/2018 INDICATION: Patient with history of chylothorax status post embolization procedure, and tunneled pleural catheter placed on the left. Now with recurrent right pleural effusion. Request is made for diagnostic and therapeutic thoracentesis. EXAM: ULTRASOUND GUIDED DIAGNOSTIC AND THERAPEUTIC RIGHT THORACENTESIS MEDICATIONS: 10 mL 1% lidocaine COMPLICATIONS: None immediate. PROCEDURE: An ultrasound guided thoracentesis was thoroughly discussed with the patient and questions answered. The benefits, risks, alternatives and complications were also discussed. The patient understands and wishes to proceed with the procedure. Written consent was obtained. Ultrasound was performed to localize and mark an adequate pocket of fluid in the right chest. The area was then prepped and draped in the normal sterile fashion. 1% Lidocaine was used for local anesthesia. Under ultrasound guidance a 6 Fr Safe-T-Centesis catheter was introduced. Thoracentesis was performed. The catheter was removed and a dressing applied. FINDINGS: A total of approximately 600 mL of amber fluid was removed. Samples were sent to the laboratory as requested by the clinical team. IMPRESSION: Successful ultrasound guided diagnostic and therapeutic right thoracentesis yielding 600 mL of pleural fluid. Read by: Brynda Greathouse PA-C Electronically Signed   By: Jacqulynn Cadet M.D.   On: 06/28/2018 10:54   Ir Thoracentesis Asp Pleural Space W/img Guide  Result Date: 06/16/2018 INDICATION: History of recent esophagectomy for esophageal cancer. Recurrent left-sided pleural effusion. Request diagnostic and therapeutic thoracentesis. EXAM: ULTRASOUND GUIDED LEFT THORACENTESIS MEDICATIONS: None. COMPLICATIONS: None immediate. PROCEDURE: An ultrasound guided thoracentesis was  thoroughly discussed with the patient and questions answered. The benefits, risks, alternatives and complications were also discussed. The patient understands and wishes to proceed with the procedure. Written consent was obtained. Ultrasound was performed to localize and mark an adequate pocket of fluid in the left chest. The area was then prepped and draped  in the normal sterile fashion. 1% Lidocaine was used for local anesthesia. Under ultrasound guidance a 6 Fr Safe-T-Centesis catheter was introduced. Thoracentesis was performed. The catheter was removed and a dressing applied. FINDINGS: A total of approximately 1.5 L of chylous yellow fluid was removed. Samples were sent to the laboratory as requested by the clinical team. IMPRESSION: Successful ultrasound guided left thoracentesis yielding 1.5 L of pleural fluid. Read by: Ascencion Dike PA-C Electronically Signed   By: Jerilynn Mages.  Shick M.D.   On: 06/16/2018 12:32   US Thoracentesis Asp Pleural Space W/img Guide  Result Date: 06/09/2018 INDICATION: Status post esophagectomy 05/29/2018. Now with shortness of breath secondary to left pleural effusion. Request for diagnostic and therapeutic thoracentesis. EXAM: ULTRASOUND GUIDED LEFT THORACENTESIS MEDICATIONS: 1% lidocaine 10 mL COMPLICATIONS: None immediate. PROCEDURE: An ultrasound guided thoracentesis was thoroughly discussed with the patient and questions answered. The benefits, risks, alternatives and complications were also discussed. The patient understands and wishes to proceed with the procedure. Written consent was obtained. Ultrasound was performed to localize and mark an adequate pocket of fluid in the left chest. The area was then prepped and draped in the normal sterile fashion. 1% Lidocaine was used for local anesthesia. Under ultrasound guidance a 6 Fr Safe-T-Centesis catheter was introduced. Thoracentesis was performed. The catheter was removed and a dressing applied. FINDINGS: A total of approximately  1.8 L of milky yellow fluid was removed. Samples were sent to the laboratory as requested by the clinical team. IMPRESSION: Successful ultrasound guided left thoracentesis yielding 1.8 L of pleural fluid. No pneumothorax on post-procedure chest x-ray. Read by: Gareth Eagle, PA-C Electronically Signed   By: Lucrezia Europe M.D.   On: 06/09/2018 13:05    Discharge Medications: Allergies as of 07/04/2018   No Known Allergies     Medication List    STOP taking these medications   lisinopril-hydrochlorothiazide 20-12.5 MG tablet Commonly known as:  Prinzide   traMADol 50 MG tablet Commonly known as:  ULTRAM     TAKE these medications   amLODipine 10 MG tablet Commonly known as:  NORVASC Take 1 tablet (10 mg total) by mouth daily.   carvedilol 12.5 MG tablet Commonly known as:  COREG Take 1 tablet (12.5 mg total) by mouth 2 (two) times daily with a meal.   cloNIDine 0.2 mg/24hr patch Commonly known as:  CATAPRES - Dosed in mg/24 hr Place 1 patch (0.2 mg total) onto the skin once a week. Start taking on:  July 07, 2018   free water Soln Flush J tube (big red tube) two times daily (morning and at night) with 10 mg   ibuprofen 800 MG tablet Commonly known as:  ADVIL,MOTRIN Take 1 tablet (800 mg total) by mouth 3 (three) times daily. What changed:    when to take this  reasons to take this   lisinopril 20 MG tablet Commonly known as:  PRINIVIL,ZESTRIL Take 1 tablet (20 mg total) by mouth daily.   oxyCODONE 5 MG immediate release tablet Commonly known as:  Roxicodone Take 1 tablet (5 mg total) by mouth every 6 (six) hours as needed for severe pain.       Follow Up Appointments: Follow-up Information    [name], MD. Go on 07/13/2018.   Specialty:  Cardiothoracic Surgery Why:  PA/LAT CXR to be taken (at [place] which is in the same building as Dr. Everrett Coombe office) on 04/02 at 12:30 pm;Appointment time is at 1:00 pm Contact information: [place]  [place] [address] [phone]        Jettie Booze, MD. Call.   Specialties:  Cardiology, Radiology, Interventional Cardiology Why:  for a follow up appointment 2 weeks Contact information: [address] [place] [address] [phone]           Signed: Sharalyn Ink [facility] 07/04/2018, 9:01 AM

## 2018-06-16 NOTE — Progress Notes (Addendum)
      [address]       [place] [address]             [phone]       18 Days Post-Op Procedure(s) (LRB): TRANSHIATAL TOTAL ESOPHAGECTOMY (N/A) PYLOROMYOTOMY (N/A) FEEDING JEJUNOSTOMY (N/A) CERVICOESOPHAGO GASTROSTOMY (Right) Flexible Bronchoscopy LEFT CHEST TUBE INSERTION (Left)  Subjective: Patient frustrated-"I have been in here a long time".  Objective: Vital signs in last 24 hours: Temp:  [97.4 F (36.3 C)-97.9 F (36.6 C)] 97.9 F (36.6 C) (03/06 0700) Pulse Rate:  [84-107] 102 (03/06 0700) Cardiac Rhythm: Sinus tachycardia (03/06 0700) Resp:  [12-29] 26 (03/06 0700) BP: (160-182)/(86-105) 176/94 (03/06 0700) SpO2:  [93 %-100 %] 96 % (03/06 0700) Weight:  [62 kg] 62 kg (03/06 0548)     Intake/Output from previous day: 03/05 0701 - 03/06 0700 In: 1690 [NG/GT:1690] Out: 220 [Urine:220]   Physical Exam:  Cardiovascular: Slightly tachycardia Pulmonary: Diminishes at bases Abdomen: Soft, non tender, bowel sounds present. Extremities: Trace  lower extremity edema. Wounds: Clean and dry.  No erythema or signs of infection.   Lab Results: CBC: Recent Labs    06/14/18 0359  WBC 9.3  HGB 10.3*  HCT 32.3*  PLT 440*   BMET:  Recent Labs    06/14/18 0359 06/15/18 0416  NA 146* 144  K 3.6 3.9  CL 111 108  CO2 27 27  GLUCOSE 128* 129*  BUN 31* 27*  CREATININE 2.12* 1.91*  CALCIUM 8.2* 8.3*    PT/INR: No results for input(s): LABPROT, INR in the last 72 hours. ABG:  INR: Will add last result for INR, ABG once components are confirmed Will add last 4 CBG results once components are confirmed  Assessment/Plan:  1. CV - Previous SB/ST. ST in the low 100's this am.  Hypertensive. On Amlodipine 10 mg daily, Coreg 12.5 mg Q am and 6.25 mg Q pm, Clonidine patch 0.1 mg. Cardiology following. Nurse to give BP medications early this am. 2.  Pulmonary - On 2 liters oxygen via [place]. CXR this am shows bilateral pleural effusions and  atelectasis L>R. Has had previous left thoracentesis. Will discuss with Dr. Servando Snare if thinks needs again. Encourage incentive spirometer 3. CBGs 130/117/133. 4. Creatinine yesterday down to 1.91. Will recheck in am 5. GI-tolerating soft diet, feeding supplement via J tube 6. Anemia-Last H and H stable at 10.3 and 32.3  Donielle M ZimmermanPA-C 06/16/2018,8:14 AM [phone]  Will retap left effusion I have seen and examined Aaron Wang and agree with the above assessment  and plan.  [name] MD [facility] [phone] Office [phone] 06/16/2018 10:22 AM   I have seen and examined Aaron Wang and agree with the above assessment  and plan.  [name] MD Beeper [phone] Office [phone] 06/16/2018 10:21 AM

## 2018-06-16 NOTE — Procedures (Signed)
PROCEDURE SUMMARY:  Successful US guided left thoracentesis. Yielded 1.5 L of chylous yellow fluid. Pt tolerated procedure well. No immediate complications.  Specimen was sent for labs. CXR ordered.  EBL < 5 mL  Ascencion Dike PA-C 06/16/2018 12:28 PM

## 2018-06-16 NOTE — Progress Notes (Addendum)
CSW visited with the patient at bedside. Patient was alert and oriented. Patient states he has been living at a motel for uncomfortable with returning there. Patient request to try SNF placement. Patient was agreeable to turning over his SSI payment if approved by SNF for placement and staying at least 30 days. Patient states he preferred SNF near [place] area or near the hospital. CSW explained his clinicals will be reviewed to determine if he meets the requirements for SNF placement. Patient states he understands. CSW faxed out referrals.  CSW explained het will need to apply for medicaid. Paitent's step daughter was suppose to apply for medicaid for the patient. CSW called the patient's step-daughter,Amanda. CSW left message inquiring if she had applied for medicaid on the patient's behalf and requested call back. Patient states his step-daughter is visiting with him tomorrow and he will also inquire with her. CSW requested they call and leave message.  Thurmond Butts, MSW, [place] Social Worker [phone]

## 2018-06-16 NOTE — Progress Notes (Signed)
[facility] DAILY ICU PROGRESS NOTE                   [address]            [place] [address]          [phone]   18 Days Post-Op Procedure(s) (LRB): TRANSHIATAL TOTAL ESOPHAGECTOMY (N/A) PYLOROMYOTOMY (N/A) FEEDING JEJUNOSTOMY (N/A) CERVICOESOPHAGO GASTROSTOMY (Right) Flexible Bronchoscopy LEFT CHEST TUBE INSERTION (Left)  Total Length of Stay:  LOS: 18 days   Subjective: Awake and alert, back from thoracentesis, taking po better   Objective: Vital signs in last 24 hours: Temp:  [97.4 F (36.3 C)-98.2 F (36.8 C)] 97.9 F (36.6 C) (03/06 1500) Pulse Rate:  [84-107] 85 (03/06 1500) Cardiac Rhythm: Sinus tachycardia (03/06 1500) Resp:  [12-29] 17 (03/06 1500) BP: (158-181)/(86-105) 163/101 (03/06 1500) SpO2:  [93 %-98 %] 98 % (03/06 1500) Weight:  [62 kg] 62 kg (03/06 0548)  Filed Weights   06/05/18 0515 06/08/18 0615 06/16/18 0548  Weight: 59.8 kg 61.7 kg 62 kg    Weight change:    Hemodynamic parameters for last 24 hours:    Intake/Output from previous day: 03/05 0701 - 03/06 0700 In: 1690 [NG/GT:1690] Out: 220 [Urine:220]  Intake/Output this shift: Total I/O In: 1335 [P.O.:360; NG/GT:975] Out: 400 [Urine:400]  Current Meds: Scheduled Meds: . amLODipine  10 mg Oral Daily  . carvedilol  12.5 mg Oral q morning - 10a  . carvedilol  6.25 mg Oral QPM  . chlorhexidine gluconate (MEDLINE KIT)  15 mL Mouth Rinse BID  . Chlorhexidine Gluconate Cloth  6 each Topical Daily  . cloNIDine  0.1 mg Transdermal Weekly  . famotidine  20 mg Oral Daily  . feeding supplement (ENSURE ENLIVE)  237 mL Oral TID BM  . free water  100 mL Per Tube Q8H  . guaiFENesin  600 mg Oral BID  . lidocaine      . pantoprazole (PROTONIX) IV  40 mg Intravenous Q12H  . sodium chloride flush  10-40 mL Intracatheter Q12H   Continuous Infusions: . feeding supplement (OSMOLITE 1.2 CAL) 65 mL/hr at 06/16/18 1100   PRN Meds:.oxyCODONE **AND** acetaminophen,  diphenoxylate-atropine, labetalol, lidocaine, ondansetron (ZOFRAN) IV, potassium chloride, sodium chloride flush  General appearance: alert, cooperative and no distress Neurologic: intact Heart: regular rate and rhythm, S1, S2 normal, no murmur, click, rub or gallop Lungs: clear to auscultation bilaterally Abdomen: soft, non-tender; bowel sounds normal; no masses,  no organomegaly Extremities: extremities normal, atraumatic, no cyanosis or edema and Homans sign is negative, no sign of DVT Wound: inatct no drainage   Lab Results: CBC: Recent Labs    06/14/18 0359  WBC 9.3  HGB 10.3*  HCT 32.3*  PLT 440*   BMET:  Recent Labs    06/15/18 0416 06/16/18 0917  NA 144 145  K 3.9 4.0  CL 108 111  CO2 27 27  GLUCOSE 129* 112*  BUN 27* 24*  CREATININE 1.91* 1.77*  CALCIUM 8.3* 8.4*    CMET: Lab Results  Component Value Date   WBC 9.3 06/14/2018   HGB 10.3 (L) 06/14/2018   HCT 32.3 (L) 06/14/2018   PLT 440 (H) 06/14/2018   GLUCOSE 112 (H) 06/16/2018   CHOL 94 06/11/2018   TRIG 55 06/11/2018   HDL 27 (L) 06/11/2018   LDLDIRECT 112 (H) 11/26/2009   LDLCALC 56 06/11/2018   ALT 18 06/11/2018   AST 20 06/11/2018   NA 145 06/16/2018   K  4.0 06/16/2018   CL 111 06/16/2018   CREATININE 1.77 (H) 06/16/2018   BUN 24 (H) 06/16/2018   CO2 27 06/16/2018   TSH 0.925 11/26/2009   PSA 0.63 04/16/2010   INR 1.10 05/25/2018   MICROALBUR 0.50 01/01/2010      PT/INR: No results for input(s): LABPROT, INR in the last 72 hours. Radiology: Dg Chest 1 View  Result Date: 06/16/2018 CLINICAL DATA:  Post thora today ,,sore chest EXAM: CHEST  1 VIEW COMPARISON:  Chest radiograph 06/16/2018 FINDINGS: Interval decrease in volume of LEFT pleural effusion. No pneumothorax. Small RIGHT effusion remains. RIGHT lower lobe airspace disease versus atelectasis. IMPRESSION: 1. Reduction in LEFT pleural fluid following thoracentesis. No pneumothorax. 2. Persistent RIGHT effusion and RIGHT lower lobe  atelectasis versus infiltrate. Electronically Signed   By: Suzy Bouchard M.D.   On: 06/16/2018 12:31   Dg Chest 2 View  Result Date: 06/16/2018 CLINICAL DATA:  Postop EXAM: CHEST - 2 VIEW COMPARISON:  06/13/2018 FINDINGS: Right PICC line remains in place, unchanged. Small to moderate bilateral pleural effusions with bibasilar atelectasis. Heart is upper limits normal in size. No acute bony abnormality. IMPRESSION: Stable small to moderate bilateral effusions and bibasilar opacities, likely atelectasis. Electronically Signed   By: Rolm Baptise M.D.   On: 06/16/2018 07:50   Ir Thoracentesis Asp Pleural Space W/img Guide  Result Date: 06/16/2018 INDICATION: History of recent esophagectomy for esophageal cancer. Recurrent left-sided pleural effusion. Request diagnostic and therapeutic thoracentesis. EXAM: ULTRASOUND GUIDED LEFT THORACENTESIS MEDICATIONS: None. COMPLICATIONS: None immediate. PROCEDURE: An ultrasound guided thoracentesis was thoroughly discussed with the patient and questions answered. The benefits, risks, alternatives and complications were also discussed. The patient understands and wishes to proceed with the procedure. Written consent was obtained. Ultrasound was performed to localize and mark an adequate pocket of fluid in the left chest. The area was then prepped and draped in the normal sterile fashion. 1% Lidocaine was used for local anesthesia. Under ultrasound guidance a 6 Fr Safe-T-Centesis catheter was introduced. Thoracentesis was performed. The catheter was removed and a dressing applied. FINDINGS: A total of approximately 1.5 L of chylous yellow fluid was removed. Samples were sent to the laboratory as requested by the clinical team. IMPRESSION: Successful ultrasound guided left thoracentesis yielding 1.5 L of pleural fluid. Read by: Ascencion Dike PA-C Electronically Signed   By: Jerilynn Mages.  Shick M.D.   On: 06/16/2018 12:32     Assessment/Plan: S/P Procedure(s) (LRB): TRANSHIATAL  TOTAL ESOPHAGECTOMY (N/A) PYLOROMYOTOMY (N/A) FEEDING JEJUNOSTOMY (N/A) CERVICOESOPHAGO GASTROSTOMY (Right) Flexible Bronchoscopy LEFT CHEST TUBE INSERTION (Left) Repeat thoracentesis done today, 1.5 ml removed , triglycerides pending on fluid I have seen and examined Perlie Mayo and agree with the above assessment  and plan.  [name] MD Beeper [phone] Office [phone] 06/16/2018 5:10 PM       [name] 06/16/2018 5:07 PM

## 2018-06-16 NOTE — Care Management Note (Addendum)
Case Management Note Previous Note created by Natalie Gay  Patient Details  Name: Aaron Wang MRN: [id number] Date of Birth: 05/22/1955  Subjective/Objective: 62 yo male presented for a total esophagectomy, jejunostomy, pyloromyotomy on 05/29/18.              Action/Plan: CM met with patient to discuss dispositional needs. PCP verified as: Dr. Edwin Avbuere. Patient states being independent PTA and had been living in a motel for 3 years; patients significant other passed away December, 2019 and he has since been living at home alone and verbalized he has no family living locally to assist with his care post transition. Patient is concerned about his living situation, increased weakness and possibly his inability to safety manage all of his needs. CSW consulted to assist with LT placement needs. Patient has a gastrograffin swallow scheduled on 2/24, with CM team to continue to follow.   Expected Discharge Date:                  Expected Discharge Plan:  Skilled Nursing Facility  In-House Referral:  Clinical Social Work  Discharge planning Services  CM Consult  Post Acute Care Choice:  NA Choice offered to:  NA  DME Arranged:  N/A DME Agency:  NA  HH Arranged:  NA HH Agency:  NA  Status of Service:  In process, will continue to follow  If discussed at Long Length of Stay Meetings, dates discussed:    Additional Comments: 06/16/18 Pt transferred from 2H overnight.  Pt is now s/p thoracentesis, has feeding tube and not yet taking in adequate nutrition.  Pt continues to  communicate he is not comfortable with discharging back to motel - and now he has more complex needs.  CSW working on appropriate placement as pt states he can not return to hotel.  06/13/18 @ 1515-Natalie Gay RNCM-CM continuing to follow as the CSW addresses dispositional options. The CSW was consulted to address patients concerns about his living situation and being able to safety manage all of his needs post discharge.  Will continue to follow progress.  Natalie Gay RN, BSN, NCM-BC, ACM-RN [phone] 06/16/2018, 4:38 PM  

## 2018-06-16 NOTE — NC FL2 (Signed)
Aaron Wang LEVEL OF CARE SCREENING TOOL     IDENTIFICATION  Patient Name: Aaron Wang Birthdate: June 30, 1955 Sex: male Admission Date (Current Location): 05/29/2018  [facility] and [place] Number:  Herbalist and Address:  [facility] [place] [place] [address], [place], [place] [address]      Provider Number: [id number]  Attending Physician Name and Address:  [name], MD  Relative Name and Phone Number:  [name]     Current Level of Care: Hospital Recommended Level of Care: Skilled Nursing Facility(SNF for long term care ) Prior Approval Number:    Date Approved/Denied:   PASRR Number: [id number]  Discharge Plan: SNF    Current Diagnoses: Patient Active Problem List   Diagnosis Date Noted  . Protein-calorie malnutrition, severe 06/09/2018  . Acute respiratory failure with hypoxia ([place])   . Aspiration pneumonia ([place])   . Acute dyspnea   . Cardiac arrest, cause unspecified ([place])   . Malnutrition of moderate degree 05/31/2018  . Esophageal cancer ([place]) 05/29/2018  . Coronary artery calcification 05/05/2018  . Primary cancer of middle third of esophagus ([place]) 03/23/2018  . Knee meniscus pain 02/09/2013  . Back pain 02/09/2013  . Carpal tunnel syndrome 02/09/2013  . Current tear of medial cartilage or meniscus of knee 08/26/2011  . Tennis elbow 08/26/2011    Orientation RESPIRATION BLADDER Height & Weight     Self, Time, Situation, Place  O2(nasal cannula 2 (L/min)) Continent Weight: 136 lb 11 oz (62 kg) Height:  _0  (170.2 cm)  BEHAVIORAL SYMPTOMS/MOOD NEUROLOGICAL BOWEL NUTRITION STATUS      Continent Diet(bariatric full liquid diet)  AMBULATORY STATUS COMMUNICATION OF NEEDS Skin   Independent Verbally Surgical wounds(incision(closed) abdomen, incision(closed) neck, left, )                       Personal Care Assistance Level of Assistance  Bathing, Feeding, Dressing Bathing Assistance: Limited  assistance Feeding assistance: Independent(jejunostomy, LLQ) Dressing Assistance: Limited assistance     Functional Limitations Info  Sight, Hearing, Speech Sight Info: Adequate Hearing Info: Adequate Speech Info: Adequate    SPECIAL CARE FACTORS FREQUENCY  PT (By licensed PT), OT (By licensed OT)     PT Frequency: 3x per wek  OT Frequency: ot 3x per week             Contractures Contractures Info: Not present    Additional Factors Info  Code Status, Allergies Code Status Info: FULL Allergies Info: NKA           Current Medications (06/16/2018):  This is the current hospital active medication list Current Facility-Administered Medications  Medication Dose Route Frequency Provider Last Rate Last Dose  . oxyCODONE (ROXICODONE) 5 MG/5ML solution 5 mg  5 mg Per Tube Q4H PRN [name], MD   5 mg at 06/16/18 1742   And  . acetaminophen (TYLENOL) solution 325 mg  325 mg Per Tube Q4H PRN [name], MD   325 mg at 06/13/18 0811  . amLODipine (NORVASC) tablet 10 mg  10 mg Oral Daily [name], MD   10 mg at 06/16/18 5621  . carvedilol (COREG) tablet 12.5 mg  12.5 mg Oral q morning - 10a Leonie Man, MD   12.5 mg at 06/16/18 3086  . carvedilol (COREG) tablet 6.25 mg  6.25 mg Oral QPM Leonie Man, MD   6.25 mg at 06/16/18 1737  . chlorhexidine gluconate (  MEDLINE KIT) (PERIDEX) 0.12 % solution 15 mL  15 mL Mouth Rinse BID [name], MD   15 mL at 06/15/18 2018  . Chlorhexidine Gluconate Cloth 2 % PADS 6 each  6 each Topical Daily [name], MD   6 each at 06/15/18 1832  . cloNIDine (CATAPRES - Dosed in mg/24 hr) patch 0.1 mg  0.1 mg Transdermal Weekly [name], MD   0.1 mg at 06/16/18 1331  . diphenoxylate-atropine (LOMOTIL) 2.5-0.025 MG/5ML liquid 5 mL  5 mL Per Tube Q4H PRN [name], MD   5 mL at 06/13/18 0811  . famotidine (PEPCID) tablet 20 mg  20 mg Oral Daily [name], MD   20 mg at 06/16/18 1330   . feeding supplement (ENSURE ENLIVE) (ENSURE ENLIVE) liquid 237 mL  237 mL Oral TID BM [name], MD   237 mL at 06/14/18 1830  . feeding supplement (OSMOLITE 1.2 CAL) liquid 1,000 mL  1,000 mL Per Tube Continuous [name], MD 65 mL/hr at 06/16/18 1100    . free water 100 mL  100 mL Per Tube Q8H [name], MD   100 mL at 06/16/18 1400  . guaiFENesin (MUCINEX) 12 hr tablet 600 mg  600 mg Oral BID [name], MD   600 mg at 06/16/18 1330  . labetalol (NORMODYNE,TRANDATE) injection 20 mg  20 mg Intravenous Q2H PRN Leonie Man, MD   20 mg at 06/16/18 1742  . lidocaine (XYLOCAINE) 1 % (with pres) injection           . lidocaine (XYLOCAINE) 1 % (with pres) injection   Infiltration PRN Ascencion Dike, PA-C   10 mL at 06/16/18 1146  . ondansetron (ZOFRAN) injection 4 mg  4 mg Intravenous Q4H PRN [name], MD   4 mg at 06/02/18 2350  . pantoprazole (PROTONIX) injection 40 mg  40 mg Intravenous Q12H [name], MD   40 mg at 06/16/18 1332  . potassium chloride SA (K-DUR,KLOR-CON) CR tablet 20 mEq  20 mEq Oral Q4H PRN [name], MD   20 mEq at 06/14/18 0620  . sodium chloride flush (NS) 0.9 % injection 10-40 mL  10-40 mL Intracatheter Q12H [name], MD   10 mL at 06/16/18 1333  . sodium chloride flush (NS) 0.9 % injection 10-40 mL  10-40 mL Intracatheter PRN [name], MD         Discharge Medications: Please see discharge summary for a list of discharge medications.  Relevant Imaging Results:  Relevant Lab Results:   Additional Information SSN [id number]  Vinie Sill, LCSWA

## 2018-06-16 NOTE — Progress Notes (Signed)
Progress Note  Patient Name: Aaron Wang Date of Encounter: 06/16/2018  Primary Cardiologist: No primary care provider on file.  Initial consult by Dr. Irish Lack  Subjective   Frustrated.  Happy to be out of ICU, but would like to go home Seen just after thoracentesis.  Inpatient Medications    Scheduled Meds: . amLODipine  10 mg Oral Daily  . carvedilol  12.5 mg Oral q morning - 10a  . carvedilol  6.25 mg Oral QPM  . chlorhexidine gluconate (MEDLINE KIT)  15 mL Mouth Rinse BID  . Chlorhexidine Gluconate Cloth  6 each Topical Daily  . cloNIDine  0.1 mg Transdermal Weekly  . famotidine  20 mg Oral Daily  . feeding supplement (ENSURE ENLIVE)  237 mL Oral TID BM  . free water  100 mL Per Tube Q8H  . guaiFENesin  600 mg Oral BID  . lidocaine      . pantoprazole (PROTONIX) IV  40 mg Intravenous Q12H  . sodium chloride flush  10-40 mL Intracatheter Q12H   Continuous Infusions: . feeding supplement (OSMOLITE 1.2 CAL) 65 mL/hr at 06/16/18 1100   PRN Meds: oxyCODONE **AND** acetaminophen, diphenoxylate-atropine, labetalol, lidocaine, ondansetron (ZOFRAN) IV, potassium chloride, sodium chloride flush   Vital Signs    Vitals:   06/16/18 0800 06/16/18 0900 06/16/18 1000 06/16/18 1100  BP: (!) 177/92 (!) 172/99 (!) 172/102 (!) 158/87  Pulse: (!) 103 (!) 103 (!) 101 100  Resp:    17  Temp:    98.2 F (36.8 C)  TempSrc:    Oral  SpO2:    96%  Weight:      Height:        Intake/Output Summary (Last 24 hours) at 06/16/2018 1401 Last data filed at 06/16/2018 1100 Gross per 24 hour  Intake 1649.42 ml  Output 200 ml  Net 1449.42 ml   Filed Weights   06/05/18 0515 06/08/18 0615 06/16/18 0548  Weight: 59.8 kg 61.7 kg 62 kg   Telemetry    This AM -he had 1 brief episode where he went from a tachycardia up to a slow bradycardia in the in the high 30s low 40s and then went right back in a fast heart rate.  For the most part sinus rhythm in the 80s to 110s depending what he is  doing.. Now NSR 90s.  Personally Reviewed  ECG    No new EKG  Physical Exam   Physical Exam  Constitutional: He is oriented to person, place, and time. He appears well-developed and well-nourished. No distress.  Resting comfortably in bed.   HENT:  Head: Normocephalic and atraumatic.  Cardiovascular: Normal rate, regular rhythm and normal heart sounds.  No extrasystoles are present. PMI is not displaced. Exam reveals decreased pulses (Mildly decreased pedal pulses). Exam reveals no gallop and no friction rub.  No murmur heard. Pulmonary/Chest: Effort normal. No respiratory distress. He has no wheezes. He has no rales. He exhibits tenderness (Mild postop tenderness).  Diminished breath sounds in bilateral lung bases.  Left worse than right  Abdominal: Soft. Bowel sounds are normal. He exhibits no distension. There is abdominal tenderness (Appropriate tenderness).  Musculoskeletal: Normal range of motion.        General: No edema (Trivial).  Neurological: He is alert and oriented to person, place, and time.  Psychiatric: He has a normal mood and affect. His behavior is normal. Judgment and thought content normal.  Vitals reviewed.   Labs    Chemistry Recent Labs  Lab 06/10/18 0245 06/11/18 0305  06/14/18 0359 06/15/18 0416 06/16/18 0917  NA 150* 149*   < > 146* 144 145  K 3.2* 3.8   < > 3.6 3.9 4.0  CL 117* 118*   < > 111 108 111  CO2 25 22   < > '27 27 27  '$ GLUCOSE 105* 139*   < > 128* 129* 112*  BUN 36* 39*   < > 31* 27* 24*  CREATININE 2.37* 2.42*   < > 2.12* 1.91* 1.77*  CALCIUM 7.5* 7.6*   < > 8.2* 8.3* 8.4*  PROT 5.0* 5.3*  --   --   --   --   ALBUMIN  --  2.4*  --   --   --   --   AST  --  20  --   --   --   --   ALT  --  18  --   --   --   --   ALKPHOS  --  56  --   --   --   --   BILITOT  --  0.2*  --   --   --   --   GFRNONAA 29* 28*   < > 33* 37* 41*  GFRAA 33* 32*   < > 38* 43* 47*  ANIONGAP 8 9   < > '8 9 7   '$ < > = values in this interval not displayed.      Hematology Recent Labs  Lab 06/12/18 0358 06/13/18 0411 06/14/18 0359  WBC 10.9* 10.0 9.3  RBC 3.15* 3.08* 3.47*  HGB 9.3* 9.3* 10.3*  HCT 29.5* 28.9* 32.3*  MCV 93.7 93.8 93.1  MCH 29.5 30.2 29.7  MCHC 31.5 32.2 31.9  RDW 14.7 14.5 14.2  PLT 393 413* 440*   Cardiac Enzymes No results for input(s): TROPONINI in the last 168 hours. No results for input(s): TROPIPOC in the last 168 hours.   BNP No results for input(s): BNP, PROBNP in the last 168 hours.  DDimer No results for input(s): DDIMER in the last 168 hours.   Radiology    Dg Chest 1 View  Result Date: 06/16/2018 CLINICAL DATA:  Post thora today ,,sore chest EXAM: CHEST  1 VIEW COMPARISON:  Chest radiograph 06/16/2018 FINDINGS: Interval decrease in volume of LEFT pleural effusion. No pneumothorax. Small RIGHT effusion remains. RIGHT lower lobe airspace disease versus atelectasis. IMPRESSION: 1. Reduction in LEFT pleural fluid following thoracentesis. No pneumothorax. 2. Persistent RIGHT effusion and RIGHT lower lobe atelectasis versus infiltrate. Electronically Signed   By: Suzy Bouchard M.D.   On: 06/16/2018 12:31   Dg Chest 2 View  Result Date: 06/16/2018 CLINICAL DATA:  Postop EXAM: CHEST - 2 VIEW COMPARISON:  06/13/2018 FINDINGS: Right PICC line remains in place, unchanged. Small to moderate bilateral pleural effusions with bibasilar atelectasis. Heart is upper limits normal in size. No acute bony abnormality. IMPRESSION: Stable small to moderate bilateral effusions and bibasilar opacities, likely atelectasis. Electronically Signed   By: Rolm Baptise M.D.   On: 06/16/2018 07:50   Ir Thoracentesis Asp Pleural Space W/img Guide  Result Date: 06/16/2018 INDICATION: History of recent esophagectomy for esophageal cancer. Recurrent left-sided pleural effusion. Request diagnostic and therapeutic thoracentesis. EXAM: ULTRASOUND GUIDED LEFT THORACENTESIS MEDICATIONS: None. COMPLICATIONS: None immediate. PROCEDURE: An ultrasound  guided thoracentesis was thoroughly discussed with the patient and questions answered. The benefits, risks, alternatives and complications were also discussed. The patient understands and wishes to proceed with the  procedure. Written consent was obtained. Ultrasound was performed to localize and mark an adequate pocket of fluid in the left chest. The area was then prepped and draped in the normal sterile fashion. 1% Lidocaine was used for local anesthesia. Under ultrasound guidance a 6 Fr Safe-T-Centesis catheter was introduced. Thoracentesis was performed. The catheter was removed and a dressing applied. FINDINGS: A total of approximately 1.5 L of chylous yellow fluid was removed. Samples were sent to the laboratory as requested by the clinical team. IMPRESSION: Successful ultrasound guided left thoracentesis yielding 1.5 L of pleural fluid. Read by: Ascencion Dike PA-C Electronically Signed   By: Jerilynn Mages.  Shick M.D.   On: 06/16/2018 12:32   Cardiac Studies   TTE 06/03/2018 1. The left ventricle has normal systolic function with an ejection fraction of 60-65%. The cavity size was normal. There is mildly increased left ventricular wall thickness. Left ventricular diastolic Doppler parameters are consistent with impaired  relaxation No evidence of left ventricular regional wall motion abnormalities. 2. The right ventricle has normal systolic function. The cavity was normal. There is no increase in right ventricular wall thickness. Estimated RVSP 36.4 mmHg. 3. The pericardial effusion is posterior to the left ventricle. 4. Trivial pericardial effusion is present. 5. The aortic valve is tricuspid. Mild calcification of the aortic valve.. Mild to moderate aortic annular calcification noted. 6. The mitral valve is normal in structure. 7. The tricuspid valve is normal in structure. Tricuspid valve regurgitation is mild-moderate. 8. The aortic root is normal in size and structure. 9. Moderate pleural  effusion in the left lateral region.  Status post left-sided thoracentesis on (Friday, February 28)  Patient Profile   WAYLAND BAIK is a 64 y.o. male with a hx of HTN and esophageal cancer who is being seen for the evaluation of bradycardia.  Assessment & Plan    Sinus Bradycardia / Tachycardia  No longer really major issue.  As such, I would like to try to use labetalol as is PRN for hypertension which would allow [place] to see how much beta-blocker is able to tolerate and then potentially titrate up his carvedilol.   Accelerated hypertension (no further hypotension)Renal artery Dopplers pending -preliminary read suggests no stenosis.  Convert to PRN labetalol for blood pressure greater than 160 bpm.  With this information we can potentially titrate carvedilol tomorrow.  Now on 0.2 mg clonidine patch, Norvasc at 10  Increasing carvedilol to 6.2 5 in the morning and 12 point 5 in the evening..  Creatinine slowly improving, therefore may potentially be add back his ACE inhibitor tomorrow.  Acute renal failure  Creatinine seems improving, still not back to baseline.  Holding ACE inhibitor for now, anticipate maybe starting tomorrow..  Overall, his blood pressure continues to be very difficult to manage.  I think that once we get him back on his his ACE inhibitor, we are pretty much tapped out other than may be potentially adding spironolactone. I suspect as he becomes more comfortable his pressure may normalize out, but currently his pressures are in a stable enough range that it should not keep him in the hospital.   For questions or updates, please contact [place] Please consult [email or web address] for contact info under Cardiology/STEMI.   Signed, Glenetta Hew, MD  06/16/2018, 2:01 PM

## 2018-06-16 NOTE — Progress Notes (Signed)
Nutrition Follow-up  DOCUMENTATION CODES:   Severe malnutrition in context of acute illness/injury  INTERVENTION:   -Continue Osmolite 1.2 @ 65 ml/hr via j-tube   Continue 100 ml free water every 8 hours  Tube feeding regimen provides 1872 kcal (100% of needs), 87 grams of protein, and 1579 ml of H2O.   -Continue Ensure Enlive po TID, each supplement provides 350 kcal and 20 grams of protein -Continue Magic cup TID with meals, each supplement provides 290 kcal and 9 grams of protein -Continue to encourage smaller, frequent meals/snacks  NUTRITION DIAGNOSIS:   Severe Malnutrition related to acute illness(s/p esophagectomy (esophageal cancer)) as evidenced by moderate fat depletion, severe fat depletion, moderate muscle depletion, mild muscle depletion.  Ongoing  GOAL:   Patient will meet greater than or equal to 90% of their needs  Met with TF  MONITOR:   TF tolerance, Diet advancement, I & O's, Labs  REASON FOR ASSESSMENT:   Consult Enteral/tube feeding initiation and management, Assessment of nutrition requirement/status, Diet education  ASSESSMENT:   63 year old male w/ PMH of HTN, GERD, and esophageal cancer. On 2/17 he underwent a thoracotomy, pyloroplasty, complete esophagectomy, and had a feeding jejunostomy placed.   3/6- s/p lt thoracentesis (1.5 L chylous yellow fluid removed)  Reviewed I/O's: +1.5 L x 24 hours and +10 L since 06/02/18  Pt being wheeled out of room to radiology at time of visit.   Pt is on a soft diet, however, pt remains with minimal intake. Noted meal completion 0-20%. Pt has been refusing Ensure supplements due to not feeling well today.   Pt remains on TF, which he is tolerating well. Osmolite 1.2 infusing at goal rate of 65 ml/hr via j-tube, with 100 ml free water flush every 8 hours. Complete regimen providing 1872 kcal, 87 grams of protein, and 1579 ml of H2O, meeting 100% of estimated kcal and protein needs.   Per CSW notes, plan  to d/c to SNF once medically stable.   Labs reviewed: CBGS: 117-133.   Diet Order:   Diet Order            DIET SOFT Room service appropriate? Yes; Fluid consistency: Thin  Diet effective now              EDUCATION NEEDS:   Education needs have been addressed  Skin:  Skin Assessment: Skin Integrity Issues: Skin Integrity Issues:: Incisions Incisions: abdomen, neck  Last BM:  06/14/18  Height:   Ht Readings from Last 1 Encounters:  05/29/18 '5\' 7"'$  (1.702 m)    Weight:   Wt Readings from Last 1 Encounters:  06/16/18 62 kg    Ideal Body Weight:  67.27 kg  BMI:  Body mass index is 21.41 kg/m.  Estimated Nutritional Needs:   Kcal:  1800-2000 kcal  Protein:  85-95 g  Fluid:  >/= 1.8L    Kynley Metzger A. Jimmye Norman, RD, LDN, [place] Registered Dietitian II Certified Diabetes Care and Education Specialist Pager: [phone] After hours Pager: [phone]

## 2018-06-17 LAB — BASIC METABOLIC PANEL
Anion gap: 10 (ref 5–15)
BUN: 21 mg/dL — ABNORMAL HIGH (ref 6–20)
CO2: 26 mmol/L (ref 22–32)
CREATININE: 1.55 mg/dL — AB (ref 0.61–1.24)
Calcium: 8.2 mg/dL — ABNORMAL LOW (ref 8.9–10.3)
Chloride: 106 mmol/L (ref 98–111)
GFR calc Af Amer: 56 mL/min — ABNORMAL LOW (ref >60)
GFR calc non Af Amer: 48 mL/min — ABNORMAL LOW (ref >60)
Glucose, Bld: 130 mg/dL — ABNORMAL HIGH (ref 70–99)
Potassium: 3.6 mmol/L (ref 3.5–5.1)
Sodium: 142 mmol/L (ref 135–145)

## 2018-06-17 LAB — TRIGLYCERIDES, BODY FLUIDS: Triglycerides, Fluid: 693 mg/dL

## 2018-06-17 MED ORDER — CHLORHEXIDINE GLUCONATE CLOTH 2 % EX PADS
6.0000 | MEDICATED_PAD | Freq: Every day | CUTANEOUS | Status: DC
  Administered 2018-06-18 – 2018-07-03 (×11): 6 via TOPICAL

## 2018-06-17 MED ORDER — CHLORHEXIDINE GLUCONATE 0.12 % MT SOLN
15.0000 mL | Freq: Two times a day (BID) | OROMUCOSAL | Status: DC
  Administered 2018-06-19 – 2018-07-03 (×16): 15 mL via OROMUCOSAL
  Filled 2018-06-17 (×18): qty 15

## 2018-06-17 MED ORDER — PANTOPRAZOLE SODIUM 40 MG PO TBEC
40.0000 mg | DELAYED_RELEASE_TABLET | Freq: Two times a day (BID) | ORAL | Status: DC
  Administered 2018-06-17 – 2018-07-04 (×33): 40 mg via ORAL
  Filled 2018-06-17 (×33): qty 1

## 2018-06-17 NOTE — Evaluation (Signed)
Physical Therapy Evaluation Patient Details Name: Aaron Wang MRN: [id number] DOB: Jan 15, 1956 Today's Date: 06/17/2018   History of Present Illness  Pt is a 63 y.o. male admitted 05/29/18 with h/o esophageal cancer and increased difficulty swallowing. S/p transhiatal total esophagectomy, pyloromyotomy, J-tube insertion; extubated same day. Reintubated 2/22-2/23; and again 2/23-2/25, also with asystolic arrest requiring CPR 2/23. Pt with pleural effusion s/p thoracentesis 2/28 and 3/6.    Clinical Impression  Pt presents with an overall decrease in functional mobility secondary to above. PTA, pt indep and lives at hotel. Today, pt requiring min guard for safety with mobility; limited by fatigue and decreased activity tolerance. DOE with minimal activity; SpO2 96% on 2L O2 [place]. At high risk for falls. Pt would benefit from continued acute PT services to maximize functional mobility and independence prior to d/c with SNF-level therapies.     Follow Up Recommendations SNF    Equipment Recommendations  (TBD)    Recommendations for Other Services       Precautions / Restrictions Precautions Precautions: Fall Precaution Comments: J tube Restrictions Weight Bearing Restrictions: No      Mobility  Bed Mobility               General bed mobility comments: Received sitting in recliner  Transfers Overall transfer level: Needs assistance Equipment used: None Transfers: Sit to/from Stand Sit to Stand: Supervision            Ambulation/Gait Ambulation/Gait assistance: Min guard Gait Distance (Feet): 80 Feet Assistive device: None Gait Pattern/deviations: Step-through pattern;Decreased stride length Gait velocity: Decreased Gait velocity interpretation: <1.8 ft/sec, indicate of risk for recurrent falls General Gait Details: Slow, labored gait with intermittent min guard for balance; pt requiring multiple standing rest breaks secondary to fatigue and DOE. SpO2 96% on 2L O2  [place]  Stairs            Wheelchair Mobility    Modified Rankin (Stroke Patients Only)       Balance Overall balance assessment: Needs assistance   Sitting balance-Leahy Scale: Good       Standing balance-Leahy Scale: Fair Standing balance comment: Decreased balance reactions/postural strategies                             Pertinent Vitals/Pain Pain Assessment: No/denies pain    Home Living Family/patient expects to be discharged to:: Private residence([employer]) Living Arrangements: Alone Available Help at Discharge: Family;Available PRN/intermittently Type of Home: Other(Comment)(motel) Home Access: Level entry     Home Layout: One level Home Equipment: Cane - single point      Prior Function Level of Independence: Independent               Hand Dominance        Extremity/Trunk Assessment   Upper Extremity Assessment Upper Extremity Assessment: Overall WFL for tasks assessed    Lower Extremity Assessment Lower Extremity Assessment: Generalized weakness       Communication   Communication: No difficulties  Cognition Arousal/Alertness: Awake/alert Behavior During Therapy: WFL for tasks assessed/performed Overall Cognitive Status: Within Functional Limits for tasks assessed                                        General Comments      Exercises     Assessment/Plan    PT Assessment Patient needs  continued PT services  PT Problem List Decreased strength;Decreased activity tolerance;Decreased balance;Decreased mobility;Cardiopulmonary status limiting activity       PT Treatment Interventions DME instruction;Gait training;Stair training;Functional mobility training;Therapeutic activities;Therapeutic exercise;Balance training;Patient/family education    PT Goals (Current goals can be found in the Care Plan section)  Acute Rehab PT Goals Patient Stated Goal: Hopeful for SNF to regain strength and  independence PT Goal Formulation: With patient Time For Goal Achievement: 07/01/18 Potential to Achieve Goals: Good    Frequency Min 2X/week   Barriers to discharge Decreased caregiver support      Co-evaluation               AM-PAC PT "6 Clicks" Mobility  Outcome Measure Help needed turning from your back to your side while in a flat bed without using bedrails?: None Help needed moving from lying on your back to sitting on the side of a flat bed without using bedrails?: None Help needed moving to and from a bed to a chair (including a wheelchair)?: A Little Help needed standing up from a chair using your arms (e.g., wheelchair or bedside chair)?: A Little Help needed to walk in hospital room?: A Little Help needed climbing 3-5 steps with a railing? : A Lot 6 Click Score: 19    End of Session   Activity Tolerance: Patient tolerated treatment well Patient left: in chair;with call bell/phone within reach Nurse Communication: Mobility status PT Visit Diagnosis: Other abnormalities of gait and mobility (R26.89)    Time: 1501-1520 PT Time Calculation (min) (ACUTE ONLY): 19 min   Charges:   PT Evaluation $PT Eval Moderate Complexity: [place], PT, DPT Acute Rehabilitation Services  Pager [phone] Office [place] 06/17/2018, 4:06 PM

## 2018-06-17 NOTE — Progress Notes (Addendum)
19 Days Post-Op Procedure(s) (LRB): TRANSHIATAL TOTAL ESOPHAGECTOMY (N/A) PYLOROMYOTOMY (N/A) FEEDING JEJUNOSTOMY (N/A) CERVICOESOPHAGO GASTROSTOMY (Right) Flexible Bronchoscopy LEFT CHEST TUBE INSERTION (Left) Subjective: Aaron Wang was transferred from the ICU yesterday.  He is awake and alert.  He is resting quietly in bed.  He has no complaints.  He says he is comfortable.  He has been started on a soft diet and consumed 360 mL's p.o. yesterday which he said he tolerated well.  Feedings via J-tube continue.  Objective: Vital signs in last 24 hours: Temp:  [97.9 F (36.6 C)-98.5 F (36.9 C)] 98 F (36.7 C) (03/07 0800) Pulse Rate:  [83-100] 83 (03/07 0800) Cardiac Rhythm: Normal sinus rhythm (03/07 0700) Resp:  [16-19] 16 (03/07 0800) BP: (155-179)/(87-102) 155/99 (03/07 0800) SpO2:  [95 %-98 %] 97 % (03/07 0800) Weight:  [61.6 kg] 61.6 kg (03/07 0646)  Intake/Output from previous day: 03/06 0701 - 03/07 0700 In: 2760 [P.O.:360; I.V.:20; NG/GT:2380] Out: 1550 [Urine:1550] Intake/Output this shift: No intake/output data recorded.  General appearance: alert, cooperative and no distress Neurologic: intact Heart: regular rate and rhythm Lungs: clear to auscultation bilaterally Abdomen: Soft and nontender.  Midline incision is healing with no sign of complication.  The J-tube exits the left upper quadrant and is well secured. Extremities: No edema.  No tenderness. Wound: Incision is intact and dry.  Lab Results: No results for input(s): WBC, HGB, HCT, PLT in the last 72 hours. BMET:  Recent Labs    06/16/18 0917 06/17/18 0659  NA 145 142  K 4.0 3.6  CL 111 106  CO2 27 26  GLUCOSE 112* 130*  BUN 24* 21*  CREATININE 1.77* 1.55*  CALCIUM 8.4* 8.2*    PT/INR: No results for input(s): LABPROT, INR in the last 72 hours. ABG    Component Value Date/Time   PHART 7.479 (H) 06/07/2018 0733   HCO3 24.8 06/07/2018 0733   TCO2 26 06/07/2018 0733   ACIDBASEDEF 3.0 (H)  05/30/2018 0400   O2SAT 66.6 06/10/2018 0518   CBG (last 3)  Recent Labs    06/14/18 1638  GLUCAP 133*    Assessment/Plan: S/P Procedure(s) (LRB): TRANSHIATAL TOTAL ESOPHAGECTOMY (N/A) PYLOROMYOTOMY (N/A) FEEDING JEJUNOSTOMY (N/A) CERVICOESOPHAGO GASTROSTOMY (Right) Flexible Bronchoscopy LEFT CHEST TUBE INSERTION (Left)   -Aaron Wang is 19 days post transhiatal esophagectomy with cervical esophago-gastrostomy for esophageal cancer.  He is making a progressive recovery.  He was permitted to begin oral intake yesterday and seems to be tolerating this well so far.  Oral intake is not yet adequate to modify the tube feeding so this will continue.  He is making progress with mobility. -Pleural effusion-status post thoracentesis by interventional radiology yesterday yielding 1.5 L of chylous fluid.  Fluid sample was sent to the lab for chemistries but results are unavailable due to "interfering substance".  Follow-up chest x-ray shows small residual fluid collection and atelectasis on the left.  There is a small right pleural effusion.  Will obtain follow-up chest x-ray tomorrow -Hypertension blood pressure control is not optimal but is adequate on current multidrug therapy.  No changes to current regimen at this time. -Malnutrition-continue nutritional support  by way of the existing J-tube.  Continue soft diet for now. -Acute postoperative renal insufficiency-urine output is excellent, creatinine continues to trend downward.  Monitor. -Currently on IV Protonix and oral Pepcid.  Will discontinue pepcid and continue Protonix for now. -DVT prophylaxis-enoxaparin   LOS: 19 days    Antony Odea, PA-C [phone] 06/17/2018   I  have seen and examined the patient and agree with the assessment and plan as outlined.  Reports breathing improved since thoracentesis.  Rexene Alberts, MD 06/17/2018 10:43 AM

## 2018-06-18 ENCOUNTER — Inpatient Hospital Stay (HOSPITAL_COMMUNITY): Payer: Medicaid Other

## 2018-06-18 LAB — COMPREHENSIVE METABOLIC PANEL
ALT: 26 U/L (ref 0–44)
AST: 23 U/L (ref 15–41)
Albumin: 2.5 g/dL — ABNORMAL LOW (ref 3.5–5.0)
Alkaline Phosphatase: 65 U/L (ref 38–126)
Anion gap: 8 (ref 5–15)
BILIRUBIN TOTAL: 0.4 mg/dL (ref 0.3–1.2)
BUN: 19 mg/dL (ref 6–20)
CO2: 27 mmol/L (ref 22–32)
CREATININE: 1.49 mg/dL — AB (ref 0.61–1.24)
Calcium: 8.4 mg/dL — ABNORMAL LOW (ref 8.9–10.3)
Chloride: 107 mmol/L (ref 98–111)
GFR calc Af Amer: 58 mL/min — ABNORMAL LOW (ref >60)
GFR calc non Af Amer: 50 mL/min — ABNORMAL LOW (ref >60)
Glucose, Bld: 105 mg/dL — ABNORMAL HIGH (ref 70–99)
Potassium: 4 mmol/L (ref 3.5–5.1)
Sodium: 142 mmol/L (ref 135–145)
Total Protein: 5.4 g/dL — ABNORMAL LOW (ref 6.5–8.1)

## 2018-06-18 LAB — CBC
HEMATOCRIT: 29.9 % — AB (ref 39.0–52.0)
HEMOGLOBIN: 9.8 g/dL — AB (ref 13.0–17.0)
MCH: 30.5 pg (ref 26.0–34.0)
MCHC: 32.8 g/dL (ref 30.0–36.0)
MCV: 93.1 fL (ref 80.0–100.0)
Platelets: 416 10*3/uL — ABNORMAL HIGH (ref 150–400)
RBC: 3.21 MIL/uL — AB (ref 4.22–5.81)
RDW: 14.3 % (ref 11.5–15.5)
WBC: 12 10*3/uL — ABNORMAL HIGH (ref 4.0–10.5)
nRBC: 0 % (ref 0.0–0.2)

## 2018-06-18 LAB — LIPASE, BLOOD: Lipase: 74 U/L — ABNORMAL HIGH (ref 11–51)

## 2018-06-18 MED ORDER — CLONIDINE HCL 0.2 MG/24HR TD PTWK
0.2000 mg | MEDICATED_PATCH | TRANSDERMAL | Status: DC
  Administered 2018-06-23 – 2018-06-30 (×2): 0.2 mg via TRANSDERMAL
  Filled 2018-06-18 (×2): qty 1

## 2018-06-18 MED ORDER — CARVEDILOL 12.5 MG PO TABS: 12.5000 mg | ORAL_TABLET | Freq: Every evening | ORAL | Status: DC

## 2018-06-18 MED ORDER — ENOXAPARIN SODIUM 40 MG/0.4ML ~~LOC~~ SOLN
40.0000 mg | SUBCUTANEOUS | Status: DC
  Administered 2018-06-18 – 2018-06-20 (×3): 40 mg via SUBCUTANEOUS
  Filled 2018-06-18 (×3): qty 0.4

## 2018-06-18 MED ORDER — CARVEDILOL 12.5 MG PO TABS
12.5000 mg | ORAL_TABLET | Freq: Two times a day (BID) | ORAL | Status: DC
  Administered 2018-06-18 – 2018-07-04 (×31): 12.5 mg via ORAL
  Filled 2018-06-18 (×31): qty 1

## 2018-06-18 NOTE — Progress Notes (Signed)
Pt rested throughout day. Tube feeding on hold due to abdominal discomfort. 16:00 Pt awake, watching TV and able to enjoy a light meal. Will resume tube feeding @ 19:00 and monitor for signs of discomfort.

## 2018-06-18 NOTE — Progress Notes (Addendum)
20 Days Post-Op Procedure(s) (LRB): TRANSHIATAL TOTAL ESOPHAGECTOMY (N/A) PYLOROMYOTOMY (N/A) FEEDING JEJUNOSTOMY (N/A) CERVICOESOPHAGO GASTROSTOMY (Right) Flexible Bronchoscopy LEFT CHEST TUBE INSERTION (Left) Subjective: Says he does not feel as well today.  After eating small amounts yesterday this is about 15% of each meal), he developed nausea and abdominal pain and has been uncomfortable most of the night.  He relates pain to a coughing episode that stressed his abdominal incision.  He had a bowel movement 2 days ago and a more formed stool yesterday.  He has not had any vomiting.  His tube feeds were placed on hold earlier this morning.  Obje ctive: Vital signs in last 24 hours: Temp:  [97.7 F (36.5 C)-98.3 F (36.8 C)] 98.3 F (36.8 C) (03/08 0743) Pulse Rate:  [86-97] 97 (03/08 0743) Cardiac Rhythm: Normal sinus rhythm (03/08 0700) Resp:  [14-22] 16 (03/08 0743) BP: (160-187)/(94-108) 171/95 (03/08 0743) SpO2:  [95 %-98 %] 97 % (03/08 0743)     Intake/Output from previous day: 03/07 0701 - 03/08 0700 In: 2205 [P.O.:840; I.V.:30; NG/GT:1335] Out: 1025 [Urine:1025] Intake/Output this shift: No intake/output data recorded.  General appearance: alert, cooperative and moderate distress Neurologic: intact Heart: regular rate and rhythm Lungs: Breath sounds are clear, diminished in the bases. Abdomen: He has very few bowel sounds this morning.  His abdomen is mildly distended compared to yesterday.  There is tenderness primarily over the central abdomen just below the epigastric region.  The incision and G-tube insertion site are unchanged Extremities: All are well perfused, no edema. Wound: The chest incision and abdominal incision are dry, intact, and well approximated.  Lab Results: No results for input(s): WBC, HGB, HCT, PLT in the last 72 hours. BMET:  Recent Labs    06/16/18 0917 06/17/18 0659  NA 145 142  K 4.0 3.6  CL 111 106  CO2 27 26  GLUCOSE 112* 130*   BUN 24* 21*  CREATININE 1.77* 1.55*  CALCIUM 8.4* 8.2*    PT/INR: No results for input(s): LABPROT, INR in the last 72 hours. ABG    Component Value Date/Time   PHART 7.479 (H) 06/07/2018 0733   HCO3 24.8 06/07/2018 0733   TCO2 26 06/07/2018 0733   ACIDBASEDEF 3.0 (H) 05/30/2018 0400   O2SAT 66.6 06/10/2018 0518   CBG (last 3)  No results for input(s): GLUCAP in the last 72 hours.  Assessment/Plan: S/P Procedure(s) (LRB): TRANSHIATAL TOTAL ESOPHAGECTOMY (N/A) PYLOROMYOTOMY (N/A) FEEDING JEJUNOSTOMY (N/A) CERVICOESOPHAGO GASTROSTOMY (Right) Flexible Bronchoscopy LEFT CHEST TUBE INSERTION (Left)  -Aaron Wang is 20 days post transhiatal esophagectomy with cervical esophago-gastrostomy for esophageal cancer.  He started a soft diet 2 days ago and has been having appropriate bowel function through yesterday.  Today, he is having some nausea and abdominal discomfort.  He has some midline abdominal tenderness that was not present yesterday we will hold his tube feedings for now while we evaluate with a KUB, CBC, CMP, and lipase. -Pleural effusion-status post thoracentesis by interventional radiology 06/16/18 yielding 1.5 L of chylous fluid.  Fluid sample was sent to the lab for chemistries but results are unavailable due to "interfering substance".  Follow-up chest x-ray shows small residual fluid collection and atelectasis on the left and is essentially stable this morning.  There is a small right pleural effusion.    Will continue to follow with periodic chest x-rays. -Hypertension-he continues to be hypertensive on multidrug therapy.  I will increase his Catapres patch to TTS-2 and increase his carvedilol to 12.5 mg p.o.  twice daily -Malnutrition-we will hold tube feeding this morning and keep n.p.o. while we are evaluating his abdominal discomfort -Acute postoperative renal insufficiency-urine output is excellent, creatinine continues to trend downward.  Monitor. -Currently on IV Protonix  and oral Pepcid.  Will discontinue pepcid and continue Protonix for now. -DVT prophylaxis-enoxaparin   LOS: 20 days    Aaron Odea, Aaron Wang [phone] 06/18/2018  I have seen and examined the patient and agree with the assessment and plan as outlined.  KUB with benign gas pattern and no signs of obstruction or ileus. Some increased bibasilar ATX +/- effusions on CXR.  Check f/u CXR tomorrow to assess whether or not effusions are re accumulating.   Aaron Alberts, MD 06/18/2018 11:22 AM

## 2018-06-19 ENCOUNTER — Inpatient Hospital Stay (HOSPITAL_COMMUNITY): Payer: Medicaid Other | Admitting: Anesthesiology

## 2018-06-19 ENCOUNTER — Encounter (HOSPITAL_COMMUNITY): Payer: Self-pay | Admitting: Surgery

## 2018-06-19 ENCOUNTER — Encounter (HOSPITAL_COMMUNITY)
Admission: RE | Disposition: A | Discharge status: Home / Self Care | Payer: Self-pay | Source: Home / Self Care | Attending: Cardiothoracic Surgery

## 2018-06-19 ENCOUNTER — Inpatient Hospital Stay (HOSPITAL_COMMUNITY): Payer: Medicaid Other

## 2018-06-19 DIAGNOSIS — J90 Pleural effusion, not elsewhere classified: Secondary | ICD-10-CM

## 2018-06-19 HISTORY — PX: CHEST TUBE INSERTION: SHX231

## 2018-06-19 LAB — CBC
HCT: 28.7 % — ABNORMAL LOW (ref 39.0–52.0)
Hemoglobin: 9.1 g/dL — ABNORMAL LOW (ref 13.0–17.0)
MCH: 29.4 pg (ref 26.0–34.0)
MCHC: 31.7 g/dL (ref 30.0–36.0)
MCV: 92.6 fL (ref 80.0–100.0)
Platelets: 361 10*3/uL (ref 150–400)
RBC: 3.1 MIL/uL — ABNORMAL LOW (ref 4.22–5.81)
RDW: 14.4 % (ref 11.5–15.5)
WBC: 14.7 10*3/uL — ABNORMAL HIGH (ref 4.0–10.5)
nRBC: 0 % (ref 0.0–0.2)

## 2018-06-19 LAB — COMPREHENSIVE METABOLIC PANEL
ALT: 25 U/L (ref 0–44)
AST: 22 U/L (ref 15–41)
Albumin: 2.2 g/dL — ABNORMAL LOW (ref 3.5–5.0)
Alkaline Phosphatase: 66 U/L (ref 38–126)
Anion gap: 5 (ref 5–15)
BUN: 18 mg/dL (ref 6–20)
CO2: 28 mmol/L (ref 22–32)
Calcium: 8.2 mg/dL — ABNORMAL LOW (ref 8.9–10.3)
Chloride: 107 mmol/L (ref 98–111)
Creatinine, Ser: 1.52 mg/dL — ABNORMAL HIGH (ref 0.61–1.24)
GFR calc Af Amer: 57 mL/min — ABNORMAL LOW (ref >60)
GFR calc non Af Amer: 49 mL/min — ABNORMAL LOW (ref >60)
Glucose, Bld: 121 mg/dL — ABNORMAL HIGH (ref 70–99)
Potassium: 4.1 mmol/L (ref 3.5–5.1)
Sodium: 140 mmol/L (ref 135–145)
Total Bilirubin: 0.4 mg/dL (ref 0.3–1.2)
Total Protein: 5.1 g/dL — ABNORMAL LOW (ref 6.5–8.1)

## 2018-06-19 LAB — APTT: aPTT: 25 seconds (ref 24–36)

## 2018-06-19 LAB — PROTIME-INR
INR: 1.2 (ref 0.8–1.2)
Prothrombin Time: 14.6 seconds (ref 11.4–15.2)

## 2018-06-19 SURGERY — INSERTION, PLEURAL DRAINAGE CATHETER
Anesthesia: Monitor Anesthesia Care | Laterality: Left

## 2018-06-19 MED ORDER — DEXMEDETOMIDINE HCL IN NACL 200 MCG/50ML IV SOLN
INTRAVENOUS | Status: DC | PRN
  Administered 2018-06-19: 4 ug via INTRAVENOUS

## 2018-06-19 MED ORDER — JEVITY 1.2 CAL PO LIQD: 1000.0000 mL | ORAL | Status: DC

## 2018-06-19 MED ORDER — FENTANYL CITRATE (PF) 100 MCG/2ML IJ SOLN
INTRAMUSCULAR | Status: AC
  Administered 2018-06-19: 25 ug via INTRAVENOUS
  Filled 2018-06-19: qty 2

## 2018-06-19 MED ORDER — MIDAZOLAM HCL 5 MG/5ML IJ SOLN
INTRAMUSCULAR | Status: DC | PRN
  Administered 2018-06-19 (×2): .5 mg via INTRAVENOUS

## 2018-06-19 MED ORDER — PRO-STAT SUGAR FREE PO LIQD
30.0000 mL | Freq: Every day | ORAL | Status: DC
  Administered 2018-06-20 – 2018-06-26 (×5): 30 mL
  Filled 2018-06-19 (×7): qty 30

## 2018-06-19 MED ORDER — VITAL 1.5 CAL PO LIQD
1000.0000 mL | ORAL | Status: DC
  Administered 2018-06-19 – 2018-06-29 (×7): 1000 mL
  Filled 2018-06-19 (×12): qty 1000

## 2018-06-19 MED ORDER — CEFAZOLIN SODIUM-DEXTROSE 2-4 GM/100ML-% IV SOLN
2.0000 g | INTRAVENOUS | Status: AC
  Administered 2018-06-19: 2 g via INTRAVENOUS

## 2018-06-19 MED ORDER — PROPOFOL 10 MG/ML IV BOLUS
INTRAVENOUS | Status: AC
  Filled 2018-06-19: qty 20

## 2018-06-19 MED ORDER — CEFAZOLIN SODIUM-DEXTROSE 2-4 GM/100ML-% IV SOLN
INTRAVENOUS | Status: AC
  Filled 2018-06-19: qty 100

## 2018-06-19 MED ORDER — LABETALOL HCL 5 MG/ML IV SOLN
INTRAVENOUS | Status: AC
  Filled 2018-06-19: qty 4

## 2018-06-19 MED ORDER — LACTATED RINGERS IV SOLN
INTRAVENOUS | Status: DC
  Administered 2018-06-19: 10:00:00 via INTRAVENOUS

## 2018-06-19 MED ORDER — FENTANYL CITRATE (PF) 100 MCG/2ML IJ SOLN
25.0000 ug | INTRAMUSCULAR | Status: DC | PRN
  Administered 2018-06-19 (×2): 25 ug via INTRAVENOUS

## 2018-06-19 MED ORDER — SUCCINYLCHOLINE CHLORIDE 200 MG/10ML IV SOSY
PREFILLED_SYRINGE | INTRAVENOUS | Status: AC
  Filled 2018-06-19: qty 10

## 2018-06-19 MED ORDER — LIDOCAINE 1 % OPTIME INJ - NO CHARGE
INTRAMUSCULAR | Status: DC | PRN
  Administered 2018-06-19: 9 mL

## 2018-06-19 MED ORDER — FENTANYL CITRATE (PF) 250 MCG/5ML IJ SOLN
INTRAMUSCULAR | Status: AC
  Filled 2018-06-19: qty 5

## 2018-06-19 MED ORDER — PROMETHAZINE HCL 25 MG/ML IJ SOLN: 6.2500 mg | INTRAMUSCULAR | Status: DC | PRN

## 2018-06-19 MED ORDER — ONDANSETRON HCL 4 MG/2ML IJ SOLN
INTRAMUSCULAR | Status: DC | PRN
  Administered 2018-06-19: 4 mg via INTRAVENOUS

## 2018-06-19 MED ORDER — ONDANSETRON HCL 4 MG/2ML IJ SOLN
INTRAMUSCULAR | Status: AC
  Filled 2018-06-19: qty 2

## 2018-06-19 MED ORDER — MIDAZOLAM HCL 2 MG/2ML IJ SOLN
INTRAMUSCULAR | Status: AC
  Filled 2018-06-19: qty 2

## 2018-06-19 MED ORDER — LIDOCAINE 2% (20 MG/ML) 5 ML SYRINGE
INTRAMUSCULAR | Status: AC
  Filled 2018-06-19: qty 5

## 2018-06-19 SURGICAL SUPPLY — 27 items
BRUSH SCRUB EZ PLAIN DRY (MISCELLANEOUS) ×4 IMPLANT
CANISTER SUCT 3000ML PPV (MISCELLANEOUS) ×2 IMPLANT
COVER SURGICAL LIGHT HANDLE (MISCELLANEOUS) ×2 IMPLANT
COVER TRANSDUCER ULTRASND GEL (DRAPE) ×2 IMPLANT
COVER WAND RF STERILE (DRAPES) ×2 IMPLANT
DERMABOND ADVANCED (GAUZE/BANDAGES/DRESSINGS) ×1
DERMABOND ADVANCED .7 DNX12 (GAUZE/BANDAGES/DRESSINGS) ×1 IMPLANT
DRAPE C-ARM 42X72 X-RAY (DRAPES) ×2 IMPLANT
DRAPE LAPAROSCOPIC ABDOMINAL (DRAPES) ×2 IMPLANT
GLOVE BIO SURGEON STRL SZ 6.5 (GLOVE) ×4 IMPLANT
GOWN STRL REUS W/ TWL LRG LVL3 (GOWN DISPOSABLE) ×2 IMPLANT
GOWN STRL REUS W/TWL LRG LVL3 (GOWN DISPOSABLE) ×2
KIT BASIN OR (CUSTOM PROCEDURE TRAY) ×2 IMPLANT
KIT PLEURX DRAIN CATH 1000ML (MISCELLANEOUS) ×2 IMPLANT
KIT PLEURX DRAIN CATH 15.5FR (DRAIN) ×2 IMPLANT
KIT TURNOVER KIT B (KITS) ×2 IMPLANT
NS IRRIG 1000ML POUR BTL (IV SOLUTION) ×2 IMPLANT
PACK GENERAL/GYN (CUSTOM PROCEDURE TRAY) ×2 IMPLANT
PAD ARMBOARD 7.5X6 YLW CONV (MISCELLANEOUS) ×4 IMPLANT
SET DRAINAGE LINE (MISCELLANEOUS) IMPLANT
SUT ETHILON 3 0 FSL (SUTURE) ×2 IMPLANT
SUT VIC AB 3-0 X1 27 (SUTURE) ×2 IMPLANT
SYR CONTROL 10ML LL (SYRINGE) ×2 IMPLANT
TOWEL GREEN STERILE (TOWEL DISPOSABLE) ×2 IMPLANT
TOWEL GREEN STERILE FF (TOWEL DISPOSABLE) ×2 IMPLANT
VALVE REPLACEMENT CAP (MISCELLANEOUS) IMPLANT
WATER STERILE IRR 1000ML POUR (IV SOLUTION) ×2 IMPLANT

## 2018-06-19 NOTE — Progress Notes (Signed)
Patient interviewed in the preop area. Patient able to confirm name, DOB, procedure, NDKA, no metal in body, npo status and 8/10 abdominal pain.  Leatha Gilding, RN

## 2018-06-19 NOTE — Transfer of Care (Signed)
Immediate Anesthesia Transfer of Care Note  Patient: Aaron Wang  Procedure(s) Performed: INSERTION PLEURAL DRAINAGE CATHETER (Left )  Patient Location: PACU  Anesthesia Type:MAC  Level of Consciousness: awake, alert  and patient cooperative  Airway & Oxygen Therapy: Patient Spontanous Breathing and Patient connected to nasal cannula oxygen  Post-op Assessment: Report given to RN and Post -op Vital signs reviewed and stable  Post vital signs: Reviewed and stable  Last Vitals:  Vitals Value Taken Time  BP 157/93 06/19/2018 10:57 AM  Temp    Pulse 122 06/19/2018 10:59 AM  Resp 27 06/19/2018 10:59 AM  SpO2 100 % 06/19/2018 10:59 AM  Vitals shown include unvalidated device data.  Last Pain:  Vitals:   06/19/18 0800  TempSrc:   PainSc: 4       Patients Stated Pain Goal: 0 (80/99/83 3825)  Complications: No apparent anesthesia complications

## 2018-06-19 NOTE — Anesthesia Preprocedure Evaluation (Signed)
Anesthesia Evaluation  Patient identified by MRN, date of birth, ID band Patient awake    Reviewed: Allergy & Precautions, NPO status , Patient's Chart, lab work & pertinent test results  Airway Mallampati: I  TM Distance: >3 FB Neck ROM: Full    Dental  (+) Teeth Intact, Dental Advisory Given   Pulmonary Current Smoker,    breath sounds clear to auscultation       Cardiovascular hypertension, Pt. on medications + CAD   Rhythm:Regular Rate:Normal     Neuro/Psych negative neurological ROS  negative psych ROS   GI/Hepatic Neg liver ROS, GERD  ,Esophageal CA s/p esophagectomy with jejunostomy tube.   Endo/Other  negative endocrine ROS  Renal/GU negative Renal ROS     Musculoskeletal  (+) Arthritis , Osteoarthritis,    Abdominal Normal abdominal exam  (+)   Peds  Hematology negative hematology ROS (+)   Anesthesia Other Findings   Reproductive/Obstetrics                             Lab Results  Component Value Date   WBC 12.0 (H) 06/18/2018   HGB 9.8 (L) 06/18/2018   HCT 29.9 (L) 06/18/2018   MCV 93.1 06/18/2018   PLT 416 (H) 06/18/2018   Lab Results  Component Value Date   CREATININE 1.49 (H) 06/18/2018   BUN 19 06/18/2018   NA 142 06/18/2018   K 4.0 06/18/2018   CL 107 06/18/2018   CO2 27 06/18/2018   Lab Results  Component Value Date   INR 1.10 05/25/2018   EKG: NSR  Stress Test:  Blood pressure demonstrated a hypertensive response to exercise.  There was no ST segment deviation noted during stress.  No T wave inversion was noted during stress.  No evidence of ischemia.  No symptoms of ischemia with exertion.  Anesthesia Physical  Anesthesia Plan  ASA: III  Anesthesia Plan: MAC   Post-op Pain Management:    Induction: Intravenous  PONV Risk Score and Plan: 2 and Ondansetron, Dexamethasone and Midazolam  Airway Management Planned: Simple Face Mask and  Natural Airway  Additional Equipment:   Intra-op Plan:   Post-operative Plan:   Informed Consent: I have reviewed the patients History and Physical, chart, labs and discussed the procedure including the risks, benefits and alternatives for the proposed anesthesia with the patient or authorized representative who has indicated his/her understanding and acceptance.     Dental advisory given  Plan Discussed with: CRNA  Anesthesia Plan Comments:         Anesthesia Quick Evaluation

## 2018-06-19 NOTE — Plan of Care (Signed)
  Problem: Education: Goal: Knowledge of the prescribed therapeutic regimen will improve Outcome: Progressing   Problem: Bowel/Gastric: Goal: Gastrointestinal status for postoperative course will improve Outcome: Progressing   Problem: Nutritional: Goal: Ability to achieve adequate nutritional intake will improve Outcome: Progressing   Problem: Clinical Measurements: Goal: Postoperative complications will be avoided or minimized Outcome: Progressing   Problem: Respiratory: Goal: Ability to maintain a clear airway will improve Outcome: Progressing   Problem: Skin Integrity: Goal: Demonstration of wound healing without infection will improve Outcome: Progressing   Problem: Education: Goal: Knowledge of General Education information will improve Description: Including pain rating scale, medication(s)/side effects and non-pharmacologic comfort measures Outcome: Progressing   Problem: Health Behavior/Discharge Planning: Goal: Ability to manage health-related needs will improve Outcome: Progressing   Problem: Clinical Measurements: Goal: Ability to maintain clinical measurements within normal limits will improve Outcome: Progressing Goal: Will remain free from infection Outcome: Progressing Goal: Diagnostic test results will improve Outcome: Progressing Goal: Respiratory complications will improve Outcome: Progressing Goal: Cardiovascular complication will be avoided Outcome: Progressing   Problem: Activity: Goal: Risk for activity intolerance will decrease Outcome: Progressing   Problem: Nutrition: Goal: Adequate nutrition will be maintained Outcome: Progressing   Problem: Coping: Goal: Level of anxiety will decrease Outcome: Progressing   Problem: Elimination: Goal: Will not experience complications related to bowel motility Outcome: Progressing Goal: Will not experience complications related to urinary retention Outcome: Progressing   Problem: Pain  Managment: Goal: General experience of comfort will improve Outcome: Progressing   Problem: Safety: Goal: Ability to remain free from injury will improve Outcome: Progressing   Problem: Skin Integrity: Goal: Risk for impaired skin integrity will decrease Outcome: Progressing   

## 2018-06-19 NOTE — Op Note (Signed)
NAME: Aaron Wang, Aaron Wang MEDICAL RECORD [id number] ACCOUNT [id number] DATE OF BIRTH:1955/05/19 FACILITY: [facility] LOCATION: [place], [place]  OPERATIVE REPORT  DATE OF PROCEDURE:  06/19/2018  PREOPERATIVE DIAGNOSIS:  Recurrent left pleural effusion, chylothorax.  POSTOPERATIVE DIAGNOSIS:  Recurrent left pleural effusion, chylothorax.  SURGICAL PROCEDURE:  Placement of left PleurX catheter with fluoroscopic and ultrasound guidance and drainage of left pleural effusion.  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  Postoperatively, the patient had increasing difficulty with blood pressure control, renal insufficiency and left auditory issues.  This gradually improved.  The patient had a Gastrografin swallow with no evidence of leak, was being fed both with a p.o.  diet and tube feedings.  He began developing an increasing left pleural effusion.  This had been tapped on 2 previous occasions with an elevated triglyceride and normal cholesterol.  On followup chest x-ray today, the patient had increasing left  effusion.  Again, we recommended proceeding with placement of PleurX catheter and also transitioned the patient's diet to a no fat, medium chain triglycerides.  This was discussed with the patient who was agreeable and signed informed consent.  DESCRIPTION OF PROCEDURE:  The patient under light intravenous sedation had the left chest prepped with Betadine.  The left chest and preoperatively been marked.  Sterile drapes were applied.  Timeout was performed confirming laterality and patient  identification.  We then used a SonoSite ultrasound to confirm an area of pleural fluid.  Lidocaine 1%, a total of 8-10 mL was infiltrated in the intercostal space and also subcutaneously for tunneling of the PleurX.  A counter  incision was made slightly  more anterior to the site of entrance to the chest and a PleurX catheter was tunneled in position with the felt cuff in the tunnel.  We then used an 18 gauge needle and introduced into the left chest.  There was free return of chylous fluid.  A  guidewire was used the fluoroscopically.  Over the guidewires series of dilators and then a peel-away sheath was placed into the left chest.  Over the guidewire, the PleurX and the orange catheter were used to position the PleurX in appropriate position.   The wire and catheter were then removed.  Peel-away sheath was removed and the PleurX catheter appeared fluoroscopically to be in good position.  We then drained approximately 1600-1700 mL of chylous fluid.  A small insertion site incision was closed  with interrupted 4-0 Vicryl and Dermabond.  A nylon suture was used to secure the catheter in place.  Dry dressings were applied.  Sponge and needle count was reported as correct.   The patient tolerated the procedure without obvious complication and was transferred to the recovery room for postoperative observation.  AN/NUANCE  D:06/19/2018 T:06/19/2018 JOB:[id number]

## 2018-06-19 NOTE — Anesthesia Postprocedure Evaluation (Signed)
Anesthesia Post Note  Patient: Aaron Wang  Procedure(s) Performed: INSERTION PLEURAL DRAINAGE CATHETER (Left )     Patient location during evaluation: PACU Anesthesia Type: MAC Level of consciousness: awake and alert Pain management: pain level controlled Vital Signs Assessment: post-procedure vital signs reviewed and stable Respiratory status: spontaneous breathing, nonlabored ventilation, respiratory function stable and patient connected to nasal cannula oxygen Cardiovascular status: stable and blood pressure returned to baseline Postop Assessment: no apparent nausea or vomiting Anesthetic complications: no    Last Vitals:  Vitals:   06/19/18 1130 06/19/18 1156  BP: (!) 142/91 (!) 155/96  Pulse: (!) 120 (!) 120  Resp: (!) 22   Temp: 37.8 C (!) 36.3 C  SpO2: 98% 97%    Last Pain:  Vitals:   06/19/18 1300  TempSrc:   PainSc: Tyler Deis

## 2018-06-19 NOTE — Progress Notes (Addendum)
      [address]       [place] [address]             [phone]       21 Days Post-Op Procedure(s) (LRB): TRANSHIATAL TOTAL ESOPHAGECTOMY (N/A) PYLOROMYOTOMY (N/A) FEEDING JEJUNOSTOMY (N/A) CERVICOESOPHAGO GASTROSTOMY (Right) Flexible Bronchoscopy LEFT CHEST TUBE INSERTION (Left)  Subjective: Patient states has belly pain after coughing spells. He denies nausea or vomiting this am.  Objective: Vital signs in last 24 hours: Temp:  [98.1 F (36.7 C)-98.3 F (36.8 C)] 98.1 F (36.7 C) (03/09 0530) Pulse Rate:  [92-106] 106 (03/09 0530) Cardiac Rhythm: Normal sinus rhythm (03/09 0343) Resp:  [16-23] 23 (03/09 0530) BP: (166-197)/(91-103) 197/103 (03/09 0530) SpO2:  [96 %-100 %] 100 % (03/09 0530)     Intake/Output from previous day: 03/08 0701 - 03/09 0700 In: 6 [P.O.:360; NG/GT:130] Out: 1325 [Urine:1325]   Physical Exam:  Cardiovascular: Slightly tachycardic Pulmonary: Clear to auscultation on the right and diminished left base Abdomen: Soft, some diffuse tenderness, bowel sounds present. Extremities: Mild bilateral lower extremity edema. Wounds: Clean and dry.  No erythema or signs of infection.   Lab Results: CBC: Recent Labs    06/18/18 1321  WBC 12.0*  HGB 9.8*  HCT 29.9*  PLT 416*   BMET:  Recent Labs    06/17/18 0659 06/18/18 1321  NA 142 142  K 3.6 4.0  CL 106 107  CO2 26 27  GLUCOSE 130* 105*  BUN 21* 19  CREATININE 1.55* 1.49*  CALCIUM 8.2* 8.4*    PT/INR: No results for input(s): LABPROT, INR in the last 72 hours. ABG:  INR: Will add last result for INR, ABG once components are confirmed Will add last 4 CBG results once components are confirmed  Assessment/Plan: 1. CV-Slightly tachycardic. Remains hypertensive. On Amlodipine 10 mg daily, Carvedilol 12.5 mg bid, and Catapres patch 0.2 mg. Could increase Carvedilol-cardiology following. 2. Pulmonary-had repeat left thoracentesis Friday. On 2 liters of oxygen  via [place]. CXR this am appears to show left pleural effusion and atelectasis. Encourage incentive spirometer. 3. GI-tolerating soft diet. TFs via J tube. KUB done yeterday showed no obstruction or pneumoperitoneum. Will discuss further management with Dr. Servando Snare. 4. Acute renal insufficiency-creatinine yesterday down to 1.49. Will re check in am    Aaron Wang [facility] 06/19/2018,7:14 AM [phone]   Follow up chest xray  Shows increasing left effusion , left effusion has had low cholesterol but elevated triglycerides consistent with chylothorax Plan placement of left pleurix today and adjust diet to low fat and change tube feeding The goals risks and alternatives of the planned surgical procedure Left pleurix placement under mac  have been discussed with the patient in detail. The risks of the procedure including death, infection, stroke, myocardial infarction, bleeding, blood transfusion have all been discussed specifically.  I have quoted Aaron Wang a 1 % of perioperative mortality and a complication rate as high as 10 %. The patient's questions have been answered.Aaron Wang is willing  to proceed with the planned procedure. I have seen and examined Aaron Wang and agree with the above assessment  and plan.  [name] MD Beeper [phone] Office [phone] 06/19/2018 9:24 AM

## 2018-06-19 NOTE — Brief Op Note (Signed)
      [address]       [place] [address]             [phone]      06/19/2018  11:03 AM  PATIENT:  Aaron Wang  63 y.o. male  PRE-OPERATIVE DIAGNOSIS:  Left pleural effusion- chylous thorax   POST-OPERATIVE DIAGNOSIS: same   PROCEDURE:  Procedure(s): INSERTION PLEURAL DRAINAGE CATHETER (Left) with fluro and [place]   SURGEON:  Surgeon(s) and Role:    * [name], MD - Primary   ANESTHESIA:   MAC  EBL:  10 mL   BLOOD ADMINISTERED:none  DRAINS: left pleurix    LOCAL MEDICATIONS USED:  LIDOCAINE  and Amount: 8 ml    DISPOSITION OF SPECIMEN:  micro and chemistry  COUNTS:  YES   DICTATION: .Dragon Dictation  PLAN OF CARE: in patient   PATIENT DISPOSITION:  PACU - hemodynamically stable.   Delay start of Pharmacological VTE agent (>24hrs) due to surgical blood loss or risk of bleeding: yes

## 2018-06-19 NOTE — Progress Notes (Addendum)
Nutrition Follow-up  DOCUMENTATION CODES:   Severe malnutrition in context of acute illness/injury  INTERVENTION:   -Initiate Vital 1.5 @ 20 ml/hr via j-tube and increase by 10 ml every 4 hours to goal rate of 50 ml/hr.   30 ml Prostat daily.    Tube feeding regimen provides 1950 kcal (100% of needs), 96 grams of protein, and 917 ml of H2O. Provides 40 grams of fat daily.   NUTRITION DIAGNOSIS:   Severe Malnutrition related to acute illness(s/p esophagectomy (esophageal cancer)) as evidenced by moderate fat depletion, severe fat depletion, moderate muscle depletion, mild muscle depletion.  Ongoing  GOAL:   Patient will meet greater than or equal to 90% of their needs  Progressing  MONITOR:   TF tolerance, Diet advancement, I & O's, Labs  REASON FOR ASSESSMENT:   Consult Enteral/tube feeding initiation and management  ASSESSMENT:   63 year old male w/ PMH of HTN, GERD, and esophageal cancer. On 2/17 he underwent a thoracotomy, pyloroplasty, complete esophagectomy, and had a feeding jejunostomy placed.   3/6- s/p lt thoracentesis (1.5 L chylous yellow fluid removed) 3/9- s/p PROCEDURE:  Procedure(s): INSERTION PLEURAL DRAINAGE CATHETER (Left) with fluro and [place]   Case discussed with RN, who confirmed diagnosis of chyle leak. She reports that pt is very overwhelmed with new diagnoses. TF was held yesterday secondary to discomfort, but resumed for a brief period last night (TF held again today secondary to pleural drainage catheter placement). TF is now off and awaiting adjustments to regimen for low fat TF.   Pt is currently NPO. Previously pt was on a soft diet, however, intake was minimal (consuming a few bites of pears, jello, and water).    Given chylothorax and need for low fat formula, will select Vital 1.5, which is hydrolyzed peptide-based protein system, MCT/canola oil structured lipid, scFOD to promote tolerance.   Labs reviewed.   Diet Order:   Diet Order             Diet NPO time specified  Diet effective now              EDUCATION NEEDS:   Education needs have been addressed  Skin:  Skin Assessment: Skin Integrity Issues: Skin Integrity Issues:: Incisions Incisions: abdomen, neck  Last BM:  06/18/17  Height:   Ht Readings from Last 1 Encounters:  05/29/18 5\' 7"  (1.702 m)    Weight:   Wt Readings from Last 1 Encounters:  06/17/18 61.6 kg    Ideal Body Weight:  67.27 kg  BMI:  Body mass index is 21.27 kg/m.  Estimated Nutritional Needs:   Kcal:  1850-2050  Protein:  95-110 grams  Fluid:  > 1.8 L    Ozie Dimaria A. Jimmye Norman, RD, LDN, [place] Registered Dietitian II Certified Diabetes Care and Education Specialist Pager: [phone] After hours Pager: [phone]

## 2018-06-20 ENCOUNTER — Encounter (HOSPITAL_COMMUNITY): Payer: Self-pay | Admitting: Cardiothoracic Surgery

## 2018-06-20 ENCOUNTER — Inpatient Hospital Stay (HOSPITAL_COMMUNITY): Payer: Medicaid Other

## 2018-06-20 LAB — COMPREHENSIVE METABOLIC PANEL
ALT: 19 U/L (ref 0–44)
AST: 18 U/L (ref 15–41)
Albumin: 2.2 g/dL — ABNORMAL LOW (ref 3.5–5.0)
Alkaline Phosphatase: 68 U/L (ref 38–126)
Anion gap: 9 (ref 5–15)
BUN: 18 mg/dL (ref 6–20)
CO2: 27 mmol/L (ref 22–32)
Calcium: 8.2 mg/dL — ABNORMAL LOW (ref 8.9–10.3)
Chloride: 102 mmol/L (ref 98–111)
Creatinine, Ser: 1.35 mg/dL — ABNORMAL HIGH (ref 0.61–1.24)
GFR calc Af Amer: >60 mL/min (ref >60)
GFR calc non Af Amer: 57 mL/min — ABNORMAL LOW (ref >60)
Glucose, Bld: 118 mg/dL — ABNORMAL HIGH (ref 70–99)
Potassium: 4 mmol/L (ref 3.5–5.1)
Sodium: 138 mmol/L (ref 135–145)
Total Bilirubin: 0.4 mg/dL (ref 0.3–1.2)
Total Protein: 5 g/dL — ABNORMAL LOW (ref 6.5–8.1)

## 2018-06-20 LAB — ACID FAST SMEAR (AFB, MYCOBACTERIA): Acid Fast Smear: NEGATIVE

## 2018-06-20 LAB — CBC
HCT: 27.1 % — ABNORMAL LOW (ref 39.0–52.0)
Hemoglobin: 8.5 g/dL — ABNORMAL LOW (ref 13.0–17.0)
MCH: 29.1 pg (ref 26.0–34.0)
MCHC: 31.4 g/dL (ref 30.0–36.0)
MCV: 92.8 fL (ref 80.0–100.0)
Platelets: 324 10*3/uL (ref 150–400)
RBC: 2.92 MIL/uL — ABNORMAL LOW (ref 4.22–5.81)
RDW: 14.4 % (ref 11.5–15.5)
WBC: 9.6 10*3/uL (ref 4.0–10.5)
nRBC: 0 % (ref 0.0–0.2)

## 2018-06-20 LAB — GLUCOSE, CAPILLARY
Glucose-Capillary: 107 mg/dL — ABNORMAL HIGH (ref 70–99)
Glucose-Capillary: 107 mg/dL — ABNORMAL HIGH (ref 70–99)
Glucose-Capillary: 112 mg/dL — ABNORMAL HIGH (ref 70–99)
Glucose-Capillary: 118 mg/dL — ABNORMAL HIGH (ref 70–99)
Glucose-Capillary: 121 mg/dL — ABNORMAL HIGH (ref 70–99)
Glucose-Capillary: 122 mg/dL — ABNORMAL HIGH (ref 70–99)
Glucose-Capillary: 127 mg/dL — ABNORMAL HIGH (ref 70–99)

## 2018-06-20 LAB — TRIGLYCERIDES, BODY FLUIDS: Triglycerides, Fluid: 1371 mg/dL

## 2018-06-20 MED ORDER — BOOST / RESOURCE BREEZE PO LIQD CUSTOM
1.0000 | Freq: Three times a day (TID) | ORAL | Status: DC
  Administered 2018-06-21 – 2018-06-25 (×6): 1 via ORAL

## 2018-06-20 MED ORDER — LISINOPRIL 5 MG PO TABS
5.0000 mg | ORAL_TABLET | Freq: Every day | ORAL | Status: DC
  Administered 2018-06-20 – 2018-06-21 (×2): 5 mg via ORAL
  Filled 2018-06-20 (×2): qty 1

## 2018-06-20 NOTE — Progress Notes (Signed)
Nutrition Follow-up  DOCUMENTATION CODES:   Severe malnutrition in context of acute illness/injury  INTERVENTION:   -Boost Breeze po TID, each supplement provides 250 kcal and 9 grams of protein -Continue Vital 1.5 @ 50 ml/hr via j-tube  30 ml Prostat daily.    Continue 100 ml free water lfush every 8 hours  Tube feeding regimen provides 1950 kcal (100% of needs), 96 grams of protein, and 1217 ml of H2O. Provides 40 grams of fat daily.   NUTRITION DIAGNOSIS:   Severe Malnutrition related to acute illness(s/p esophagectomy (esophageal cancer)) as evidenced by moderate fat depletion, severe fat depletion, moderate muscle depletion, mild muscle depletion.  Ongoing  GOAL:   Patient will meet greater than or equal to 90% of their needs  Met with TF  MONITOR:   TF tolerance, Diet advancement, I & O's, Labs  REASON FOR ASSESSMENT:   Consult Enteral/tube feeding initiation and management  ASSESSMENT:   63 year old male w/ PMH of HTN, GERD, and esophageal cancer. On 2/17 he underwent a thoracotomy, pyloroplasty, complete esophagectomy, and had a feeding jejunostomy placed.   3/6- s/p lt thoracentesis (1.5 L chylous yellow fluid removed) 3/9- s/p PROCEDURE:Procedure(s): INSERTION PLEURAL DRAINAGE CATHETER (Left)with fluro and [place]  Pleural drain: 1.7 L x 24 hours  Reviewed I/O's: -2.3 L x 24 hours and +6.2 L since 06/06/18  Pt resting quietly at time of visit.   Case discussed with RN, who reports pt is currently tolerating TF well. She reports pt wants to eat, however, intake remains limited (pt consuming mainly bites of fruit, jello, and water).   Given chylothorax and need for low fat formula, will select Vital 1.5, which is hydrolyzed peptide-based protein system, MCT/canola oil structured lipid, scFOD to promote tolerance. TF of Vital 1.5 @ 50 ml/hr infusing via j-tube. Pt also receiving 30 ml Prostat daily and 100 ml free water flush every 8 hours. Tube feeding  regimen provides 1950 kcal (100% of needs), 96 grams of protein, and 1217 ml of H2O. Also provides 40 grams of fat daily.   Labs reviewed: CBGS: 107-121.   Diet Order:   Diet Order            DIET SOFT Room service appropriate? Yes with Assist; Fluid consistency: Thin  Diet effective now              EDUCATION NEEDS:   Education needs have been addressed  Skin:  Skin Assessment: Skin Integrity Issues: Skin Integrity Issues:: Other (Comment) Incisions: abdomen, neck Other: lt pleurex drain  Last BM:  06/18/17  Height:   Ht Readings from Last 1 Encounters:  05/29/18 '5\' 7"'$  (1.702 m)    Weight:   Wt Readings from Last 1 Encounters:  06/20/18 59.6 kg    Ideal Body Weight:  67.27 kg  BMI:  Body mass index is 20.58 kg/m.  Estimated Nutritional Needs:   Kcal:  1850-2050  Protein:  95-110 grams  Fluid:  > 1.8 L    Tonnette Zwiebel A. Jimmye Norman, RD, LDN, [place] Registered Dietitian II Certified Diabetes Care and Education Specialist Pager: [phone] After hours Pager: [phone]

## 2018-06-20 NOTE — Progress Notes (Signed)
Left PleurX catheter drained per protocol - 1040ml milky consistency and color.  Dressing to site changed.  Will continue w/daily draining per order.

## 2018-06-20 NOTE — Progress Notes (Signed)
PT Cancellation Note  Patient Details Name: Aaron Wang MRN: [id number] DOB: Jul 12, 1955   Cancelled Treatment:    Reason Eval/Treat Not Completed: Fatigue/lethargy limiting ability to participate. Patient has been in chair today for >3 hours. Requesting to hold PT due to increased fatigue at this time. Will coordinate with pain medication and mobility next attempt.    Duncan Dull 06/20/2018, 4:31 PM Alben Deeds, PT DPT  Board Certified Neurologic Specialist Acute Rehabilitation Services Pager [phone] Office [phone]

## 2018-06-20 NOTE — Progress Notes (Signed)
Progress Note  Patient Name: Aaron Wang Date of Encounter: 06/20/2018  Primary Cardiologist: Dr. Irish Lack  Subjective   Postop day #1 pleural tube insertion by Dr. Servando Snare.  The patient is otherwise asymptomatic but is still hypertensive.  Inpatient Medications    Scheduled Meds: . amLODipine  10 mg Oral Daily  . carvedilol  12.5 mg Oral BID WC  . chlorhexidine  15 mL Mouth/Throat BID  . Chlorhexidine Gluconate Cloth  6 each Topical Daily  . [START ON 06/23/2018] cloNIDine  0.2 mg Transdermal Weekly  . enoxaparin (LOVENOX) injection  40 mg Subcutaneous Q24H  . feeding supplement (PRO-STAT SUGAR FREE 64)  30 mL Per Tube Daily  . free water  100 mL Per Tube Q8H  . guaiFENesin  600 mg Oral BID  . pantoprazole  40 mg Oral BID  . sodium chloride flush  10-40 mL Intracatheter Q12H   Continuous Infusions: . feeding supplement (VITAL 1.5 CAL) 1,000 mL (06/19/18 2015)  . lactated ringers 10 mL/hr at 06/19/18 0939   PRN Meds: oxyCODONE **AND** acetaminophen, diphenoxylate-atropine, labetalol, lidocaine, ondansetron (ZOFRAN) IV, potassium chloride, sodium chloride flush   Vital Signs    Vitals:   06/19/18 2357 06/20/18 0402 06/20/18 0712 06/20/18 0751  BP:  (!) 162/94  (!) 173/93  Pulse:      Resp:      Temp: 98.6 F (37 C) 98.5 F (36.9 C)  98.4 F (36.9 C)  TempSrc: Oral Oral  Oral  SpO2:  98%    Weight:   59.6 kg   Height:        Intake/Output Summary (Last 24 hours) at 06/20/2018 0935 Last data filed at 06/20/2018 0402 Gross per 24 hour  Intake 560 ml  Output 2685 ml  Net -2125 ml   Last 3 Weights 06/20/2018 06/17/2018 06/16/2018  Weight (lbs) 131 lb 6.3 oz 135 lb 12.9 oz 136 lb 11 oz  Weight (kg) 59.6 kg 61.6 kg 62 kg      Telemetry    Sinus rhythm- Personally Reviewed  ECG    Not performed today- Personally Reviewed  Physical Exam   GEN: No acute distress.   Neck: No JVD Cardiac: RRR, no murmurs, rubs, or gallops.  Respiratory: Clear to  auscultation bilaterally. GI: Soft, nontender, non-distended  MS: No edema; No deformity. Neuro:  Nonfocal  Psych: Normal affect   Labs    Chemistry Recent Labs  Lab 06/18/18 1321 06/19/18 1415 06/20/18 0430  NA 142 140 138  K 4.0 4.1 4.0  CL 107 107 102  CO2 27 28 27   GLUCOSE 105* 121* 118*  BUN 19 18 18   CREATININE 1.49* 1.52* 1.35*  CALCIUM 8.4* 8.2* 8.2*  PROT 5.4* 5.1* 5.0*  ALBUMIN 2.5* 2.2* 2.2*  AST 23 22 18   ALT 26 25 19   ALKPHOS 65 66 68  BILITOT 0.4 0.4 0.4  GFRNONAA 50* 49* 57*  GFRAA 58* 57* >60  ANIONGAP 8 5 9      Hematology Recent Labs  Lab 06/18/18 1321 06/19/18 1415 06/20/18 0430  WBC 12.0* 14.7* 9.6  RBC 3.21* 3.10* 2.92*  HGB 9.8* 9.1* 8.5*  HCT 29.9* 28.7* 27.1*  MCV 93.1 92.6 92.8  MCH 30.5 29.4 29.1  MCHC 32.8 31.7 31.4  RDW 14.3 14.4 14.4  PLT 416* 361 324    Cardiac EnzymesNo results for input(s): TROPONINI in the last 168 hours. No results for input(s): TROPIPOC in the last 168 hours.   BNPNo results for input(s): BNP, PROBNP  in the last 168 hours.   DDimer No results for input(s): DDIMER in the last 168 hours.   Radiology    Dg Chest 2 View  Result Date: 06/19/2018 CLINICAL DATA:  63 year old male EXAM: CHEST - 2 VIEW COMPARISON:  Prior chest x-ray 06/18/2018 FINDINGS: The tip of the right upper extremity PICC remains in good position at the superior cavoatrial junction. The cardiopericardial silhouette is largely obscured by a left basilar process. There is a moderately large left layering pleural effusion with associated dense opacification of the left mid and lower lung. Trace right pleural effusion also persists and appears smaller compared to yesterday. Upper lung predominant emphysematous changes. Diffuse mild interstitial prominence remains present. No acute osseous abnormality. IMPRESSION: 1. Persistent moderate left-sided pleural effusion with associated dense left basilar opacification which may reflect atelectasis and/or  infiltrate. 2. Small right pleural effusion. 3. Mild interstitial edema superimposed on a background of emphysema/COPD. 4. Well-positioned right upper extremity PICC. Electronically Signed   By: Jacqulynn Cadet M.D.   On: 06/19/2018 08:34   Dg Abd 1 View  Result Date: 06/18/2018 CLINICAL DATA:  Abdominal pain EXAM: ABDOMEN - 1 VIEW COMPARISON:  06/03/2018 abdominal radiograph FINDINGS: Catheter overlies the left lower quadrant. Surgical clips overlie the left upper quadrant. No dilated small bowel loops. Mild stool and gas in the large bowel. No evidence of pneumatosis or pneumoperitoneum. Left greater than right pleural effusions at the lung bases. No radiopaque nephrolithiasis. IMPRESSION: 1. Nonobstructive bowel gas pattern. 2. Left greater than right pleural effusions at the lung bases. Electronically Signed   By: Ilona Sorrel M.D.   On: 06/18/2018 15:54   Dg Chest Port 1 View  Result Date: 06/20/2018 CLINICAL DATA:  Shortness of breath EXAM: PORTABLE CHEST 1 VIEW COMPARISON:  06/19/2018 FINDINGS: Right-sided PICC line is again seen and stable. PleurX catheter is now seen in satisfactory position. Significant decrease in left-sided pleural effusion is noted. Small right effusion is seen. Bibasilar infiltrative changes are noted increased on the right when compared with the prior study. IMPRESSION: Interval PleurX catheter placement with decrease in left-sided pleural effusion. Bibasilar infiltrates increased on the right when compared with the prior study. Small right effusion is noted as well. Electronically Signed   By: Inez Catalina M.D.   On: 06/20/2018 07:03   Dg C-arm 1-60 Min-no Report  Result Date: 06/19/2018 Fluoroscopy was utilized by the requesting physician.  No radiographic interpretation.    Cardiac Studies   2D echocardiogram (06/01/2018)  IMPRESSIONS    1. The left ventricle has normal systolic function with an ejection fraction of 60-65%. The cavity size was normal. There  is mildly increased left ventricular wall thickness. Left ventricular diastolic Doppler parameters are consistent with impaired  relaxation No evidence of left ventricular regional wall motion abnormalities.  2. The right ventricle has normal systolic function. The cavity was normal. There is no increase in right ventricular wall thickness. Estimated RVSP 36.4 mmHg.  3. The pericardial effusion is posterior to the left ventricle.  4. Trivial pericardial effusion is present.  5. The aortic valve is tricuspid. Mild calcification of the aortic valve.. Mild to moderate aortic annular calcification noted.  6. The mitral valve is normal in structure.  7. The tricuspid valve is normal in structure. Tricuspid valve regurgitation is mild-moderate.  8. The aortic root is normal in size and structure.  9. Moderate pleural effusion in the left lateral region  Patient Profile     63 y.o. male with a  history of hypertension and esophageal cancer status post pleural tube insertion yesterday by Dr. Servando Snare.  We have been managing his blood pressure.  He is on lisinopril/hydrochlorothiazide at home.  Assessment & Plan    1: Essential hypertension- blood pressure 160/90 today.  He is on carvedilol, amlodipine, and Catapres patch.  I am going to add back his lisinopril.  His creatinine is 1.3.  He was on this at home.  We will follow his renal function.  His heart rate is up in the 90s.  There is room to advance his carvedilol if necessary as well.      For questions or updates, please contact [place] Please consult [email or web address] for contact info under        Signed, Quay Burow, MD  06/20/2018, 9:35 AM

## 2018-06-20 NOTE — Progress Notes (Signed)
Patient has requested CSW explain the SNF placement process to his step- daughter. CSW spokw with this daughter by phone  and explained  the patient would have to turn over his monthly income to SNF facility if accepted by SNF. Patient is willing to go to SNF and his step-daughter agrees with discharge plan.  Aaron Wang, MSW, [place] Social Worker [phone]

## 2018-06-20 NOTE — Progress Notes (Addendum)
      [address]       [place] [address]             [phone]       1 Day Post-Op Procedure(s) (LRB): INSERTION PLEURAL DRAINAGE CATHETER (Left)  Subjective: Patient states has pain on left side (Pleur x catheter). Was able to expectorate a sputum earlier this am. Has belly pain when has "couging spells".  Objective: Vital signs in last 24 hours: Temp:  [97.4 F (36.3 C)-100 F (37.8 C)] 98.5 F (36.9 C) (03/10 0402) Pulse Rate:  [107-120] 107 (03/09 2026) Cardiac Rhythm: Normal sinus rhythm (03/10 0420) Resp:  [20-26] 20 (03/09 1600) BP: (141-172)/(85-96) 162/94 (03/10 0402) SpO2:  [92 %-100 %] 98 % (03/10 0402) FiO2 (%):  [2 %] 2 % (03/09 1600)     Intake/Output from previous day: 03/09 0701 - 03/10 0700 In: 560 [P.O.:300; I.V.:160; NG/GT:100] Out: 2885 [Urine:1175; Blood:10]   Physical Exam:  Cardiovascular: Slightly tachycardic Pulmonary: Clear to auscultation on the right and diminished left base Abdomen: Soft, some diffuse tenderness, bowel sounds present. Extremities: Mild bilateral lower extremity edema. Wounds: Clean and dry.  No erythema or signs of infection.   Lab Results: CBC: Recent Labs    06/19/18 1415 06/20/18 0430  WBC 14.7* 9.6  HGB 9.1* 8.5*  HCT 28.7* 27.1*  PLT 361 324   BMET:  Recent Labs    06/19/18 1415 06/20/18 0430  NA 140 138  K 4.1 4.0  CL 107 102  CO2 28 27  GLUCOSE 121* 118*  BUN 18 18  CREATININE 1.52* 1.35*  CALCIUM 8.2* 8.2*    PT/INR:  Recent Labs    06/19/18 1445  LABPROT 14.6  INR 1.2   ABG:  INR: Will add last result for INR, ABG once components are confirmed Will add last 4 CBG results once components are confirmed  Assessment/Plan: 1. CV-Slightly tachycardic. Remains hypertensive. On Amlodipine 10 mg daily, Carvedilol 12.5 mg bid, and Catapres patch 0.2 mg. Could increase Carvedilol but will discuss with Dr. Servando Snare 2. Pulmonary-S/p left Pleur X catheter yesterday for  recurrent pleural effusion and chylothorax. Catheter to be drained daily, starting today.  Await Triglycerides and total cholesterol results from left pleural fluid.  On 2-3 liters of oxygen via [place]. CXR this am appears to show patient rotated to the left, decrease in left pleural effusion and bibasilar atelectasis R>L. Encourage incentive spirometer and flutter valve 3. GI-Low fat TFs via J tube and low fat diet (as has chylothorax).  4. Acute renal insufficiency-creatinine decreased to 1.35.  5. Anemia-H and H 8.5 and 27.1 this am    Aaron M ZimmermanPA-C 06/20/2018,7:11 AM [phone]  Feels better today , renal function better Diet adjusted to low fat , mct  I have seen and examined Aaron Wang and agree with the above assessment  and plan.  [name] MD Beeper [phone] Office [phone] 06/20/2018 8:12 AM

## 2018-06-21 ENCOUNTER — Encounter: Payer: Self-pay | Admitting: Cardiology

## 2018-06-21 LAB — BASIC METABOLIC PANEL
Anion gap: 6 (ref 5–15)
BUN: 16 mg/dL (ref 6–20)
CO2: 30 mmol/L (ref 22–32)
Calcium: 8.4 mg/dL — ABNORMAL LOW (ref 8.9–10.3)
Chloride: 103 mmol/L (ref 98–111)
Creatinine, Ser: 1.09 mg/dL (ref 0.61–1.24)
GFR calc Af Amer: >60 mL/min (ref >60)
GFR calc non Af Amer: >60 mL/min (ref >60)
Glucose, Bld: 116 mg/dL — ABNORMAL HIGH (ref 70–99)
POTASSIUM: 3.7 mmol/L (ref 3.5–5.1)
Sodium: 139 mmol/L (ref 135–145)

## 2018-06-21 LAB — GLUCOSE, CAPILLARY
Glucose-Capillary: 100 mg/dL — ABNORMAL HIGH (ref 70–99)
Glucose-Capillary: 102 mg/dL — ABNORMAL HIGH (ref 70–99)
Glucose-Capillary: 106 mg/dL — ABNORMAL HIGH (ref 70–99)
Glucose-Capillary: 112 mg/dL — ABNORMAL HIGH (ref 70–99)
Glucose-Capillary: 113 mg/dL — ABNORMAL HIGH (ref 70–99)

## 2018-06-21 MED ORDER — LISINOPRIL 10 MG PO TABS
10.0000 mg | ORAL_TABLET | Freq: Every day | ORAL | Status: DC
  Administered 2018-06-22 – 2018-06-23 (×2): 10 mg via ORAL
  Filled 2018-06-21 (×2): qty 1

## 2018-06-21 MED ORDER — ENOXAPARIN SODIUM 40 MG/0.4ML ~~LOC~~ SOLN
40.0000 mg | SUBCUTANEOUS | Status: AC
  Administered 2018-06-22: 40 mg via SUBCUTANEOUS
  Filled 2018-06-21: qty 0.4

## 2018-06-21 MED ORDER — POTASSIUM CHLORIDE 20 MEQ/15ML (10%) PO SOLN
40.0000 meq | Freq: Every day | ORAL | Status: DC
  Administered 2018-06-21: 40 meq
  Filled 2018-06-21: qty 30

## 2018-06-21 NOTE — Progress Notes (Signed)
PT Cancellation Note  Patient Details Name: Aaron Wang MRN: [id number] DOB: 07/08/1955   Cancelled Treatment:    Reason Eval/Treat Not Completed: Other (comment)(RN requesting to hold). RN requesting to hold at this time as patient had recent drainage procedure and is currently resting. States she walked with patient earlier today. Will follow up as schedule allows.   Erick Blinks, SPT  Erick Blinks 06/21/2018, 4:50 PM

## 2018-06-21 NOTE — Progress Notes (Signed)
Progress Note  Patient Name: Aaron Wang Date of Encounter: 06/21/2018  Primary Cardiologist: Dr. Irish Lack  Subjective   Postop day #2 pleural tube insertion by Dr. Servando Snare.  The patient is otherwise asymptomatic but is still hypertensive.  Blood pressure still elevated at 160/80.  We had adjusted his carvedilol and added lisinopril to yesterday.  Inpatient Medications    Scheduled Meds: . amLODipine  10 mg Oral Daily  . carvedilol  12.5 mg Oral BID WC  . chlorhexidine  15 mL Mouth/Throat BID  . Chlorhexidine Gluconate Cloth  6 each Topical Daily  . [START ON 06/23/2018] cloNIDine  0.2 mg Transdermal Weekly  . enoxaparin (LOVENOX) injection  40 mg Subcutaneous Q24H  . feeding supplement  1 Container Oral TID BM  . feeding supplement (PRO-STAT SUGAR FREE 64)  30 mL Per Tube Daily  . free water  100 mL Per Tube Q8H  . guaiFENesin  600 mg Oral BID  . lisinopril  5 mg Oral Daily  . pantoprazole  40 mg Oral BID  . potassium chloride  40 mEq Per Tube Daily  . sodium chloride flush  10-40 mL Intracatheter Q12H   Continuous Infusions: . feeding supplement (VITAL 1.5 CAL) 1,000 mL (06/20/18 2002)  . lactated ringers 10 mL/hr at 06/19/18 0939   PRN Meds: oxyCODONE **AND** acetaminophen, diphenoxylate-atropine, labetalol, lidocaine, ondansetron (ZOFRAN) IV, potassium chloride, sodium chloride flush   Vital Signs    Vitals:   06/21/18 0000 06/21/18 0249 06/21/18 0400 06/21/18 0724  BP: 139/84  131/74 (!) 160/81  Pulse: 87  89 88  Resp: 15  12 18   Temp:  98.3 F (36.8 C)  97.9 F (36.6 C)  TempSrc:  Oral  Oral  SpO2: 100%  100% 96%  Weight:  59.5 kg    Height:        Intake/Output Summary (Last 24 hours) at 06/21/2018 0941 Last data filed at 06/21/2018 6160 Gross per 24 hour  Intake 2162.5 ml  Output 1700 ml  Net 462.5 ml   Last 3 Weights 06/21/2018 06/20/2018 06/17/2018  Weight (lbs) 131 lb 2.8 oz 131 lb 6.3 oz 135 lb 12.9 oz  Weight (kg) 59.5 kg 59.6 kg 61.6 kg      Telemetry    Sinus rhythm- Personally Reviewed  ECG    Not performed today- Personally Reviewed  Physical Exam   GEN: No acute distress.   Neck: No JVD Cardiac: RRR, no murmurs, rubs, or gallops.  Respiratory: Clear to auscultation bilaterally. GI: Soft, nontender, non-distended  MS: No edema; No deformity. Neuro:  Nonfocal  Psych: Normal affect   Labs    Chemistry Recent Labs  Lab 06/18/18 1321 06/19/18 1415 06/20/18 0430 06/21/18 0301  NA 142 140 138 139  K 4.0 4.1 4.0 3.7  CL 107 107 102 103  CO2 27 28 27 30   GLUCOSE 105* 121* 118* 116*  BUN 19 18 18 16   CREATININE 1.49* 1.52* 1.35* 1.09  CALCIUM 8.4* 8.2* 8.2* 8.4*  PROT 5.4* 5.1* 5.0*  --   ALBUMIN 2.5* 2.2* 2.2*  --   AST 23 22 18   --   ALT 26 25 19   --   ALKPHOS 65 66 68  --   BILITOT 0.4 0.4 0.4  --   GFRNONAA 50* 49* 57* >60  GFRAA 58* 57* >60 >60  ANIONGAP 8 5 9 6      Hematology Recent Labs  Lab 06/18/18 1321 06/19/18 1415 06/20/18 0430  WBC 12.0* 14.7* 9.6  RBC 3.21* 3.10* 2.92*  HGB 9.8* 9.1* 8.5*  HCT 29.9* 28.7* 27.1*  MCV 93.1 92.6 92.8  MCH 30.5 29.4 29.1  MCHC 32.8 31.7 31.4  RDW 14.3 14.4 14.4  PLT 416* 361 324    Cardiac EnzymesNo results for input(s): TROPONINI in the last 168 hours. No results for input(s): TROPIPOC in the last 168 hours.   BNPNo results for input(s): BNP, PROBNP in the last 168 hours.   DDimer No results for input(s): DDIMER in the last 168 hours.   Radiology    Dg Chest Port 1 View  Result Date: 06/20/2018 CLINICAL DATA:  Shortness of breath EXAM: PORTABLE CHEST 1 VIEW COMPARISON:  06/19/2018 FINDINGS: Right-sided PICC line is again seen and stable. PleurX catheter is now seen in satisfactory position. Significant decrease in left-sided pleural effusion is noted. Small right effusion is seen. Bibasilar infiltrative changes are noted increased on the right when compared with the prior study. IMPRESSION: Interval PleurX catheter placement with decrease in  left-sided pleural effusion. Bibasilar infiltrates increased on the right when compared with the prior study. Small right effusion is noted as well. Electronically Signed   By: Inez Catalina M.D.   On: 06/20/2018 07:03   Dg C-arm 1-60 Min-no Report  Result Date: 06/19/2018 Fluoroscopy was utilized by the requesting physician.  No radiographic interpretation.    Cardiac Studies   2D echocardiogram (06/01/2018)  IMPRESSIONS    1. The left ventricle has normal systolic function with an ejection fraction of 60-65%. The cavity size was normal. There is mildly increased left ventricular wall thickness. Left ventricular diastolic Doppler parameters are consistent with impaired  relaxation No evidence of left ventricular regional wall motion abnormalities.  2. The right ventricle has normal systolic function. The cavity was normal. There is no increase in right ventricular wall thickness. Estimated RVSP 36.4 mmHg.  3. The pericardial effusion is posterior to the left ventricle.  4. Trivial pericardial effusion is present.  5. The aortic valve is tricuspid. Mild calcification of the aortic valve.. Mild to moderate aortic annular calcification noted.  6. The mitral valve is normal in structure.  7. The tricuspid valve is normal in structure. Tricuspid valve regurgitation is mild-moderate.  8. The aortic root is normal in size and structure.  9. Moderate pleural effusion in the left lateral region  Patient Profile     63 y.o. male with a history of hypertension and esophageal cancer status post pleural tube insertion yesterday by Dr. Servando Snare.  We have been managing his blood pressure.  He is on lisinopril/hydrochlorothiazide at home.  Assessment & Plan    1: Essential hypertension- blood pressure 160/80 today.  He is on carvedilol, amlodipine, and Catapres patch.  I am going to add back his lisinopril.  His creatinine is 1.3.  He was on this at home.  We will follow his renal function.  His heart  rate is up in the 90s.  There is room to advance his carvedilol if necessary as well.  His renal function is stable.  We will advance his lisinopril from 5 to 10 mg a day and follow his blood pressure.      For questions or updates, please contact [place] Please consult [email or web address] for contact info under        Signed, Quay Burow, MD  06/21/2018, 9:41 AM

## 2018-06-21 NOTE — Progress Notes (Addendum)
      [address]       [place] [address]             [phone]       2 Days Post-Op Procedure(s) (LRB): INSERTION PLEURAL DRAINAGE CATHETER (Left)  23  Days Post-Op Procedure(s) (LRB): TRANSHIATAL TOTAL ESOPHAGECTOMY (N/A) PYLOROMYOTOMY (N/A) FEEDING JEJUNOSTOMY (N/A) CERVICOESOPHAGO GASTROSTOMY (Right) Flexible Bronchoscopy  Subjective: Patient states less abdominal pain. He is passing a lot of gas. He is eating breakfast this morning. He also states it hurts on left side when Pleur X drained.  Objective: Vital signs in last 24 hours: Temp:  [97.9 F (36.6 C)-98.6 F (37 C)] 97.9 F (36.6 C) (03/11 0724) Pulse Rate:  [87-108] 88 (03/11 0724) Cardiac Rhythm: Normal sinus rhythm (03/11 0700) Resp:  [12-23] 18 (03/11 0724) BP: (131-173)/(74-94) 160/81 (03/11 0724) SpO2:  [96 %-100 %] 96 % (03/11 0724) Weight:  [59.5 kg] 59.5 kg (03/11 0249)     Intake/Output from previous day: 03/10 0701 - 03/11 0700 In: 2162.5 [P.O.:240; NG/GT:1922.5] Out: 1700 [Urine:700; Chest Tube:1000]   Physical Exam:  Cardiovascular: Slightly tachycardic Pulmonary: Clear to auscultation on the right and diminished left base Abdomen: Soft, some diffuse tenderness, bowel sounds present. Extremities: Mild bilateral lower extremity edema. Wounds: Clean and dry.  No erythema or signs of infection.   Lab Results: CBC: Recent Labs    06/19/18 1415 06/20/18 0430  WBC 14.7* 9.6  HGB 9.1* 8.5*  HCT 28.7* 27.1*  PLT 361 324   BMET:  Recent Labs    06/20/18 0430 06/21/18 0301  NA 138 139  K 4.0 3.7  CL 102 103  CO2 27 30  GLUCOSE 118* 116*  BUN 18 16  CREATININE 1.35* 1.09  CALCIUM 8.2* 8.4*    PT/INR:  Recent Labs    06/19/18 1445  LABPROT 14.6  INR 1.2   ABG:  INR: Will add last result for INR, ABG once components are confirmed Will add last 4 CBG results once components are confirmed  Assessment/Plan: 1. CV- Remains hypertensive. On Amlodipine  10 mg daily, Carvedilol 12.5 mg bid, Lisinopril 5 mg daily, and Catapres patch 0.2 mg. Cardiology following 2. Pulmonary-S/p left Pleur X catheter 03/09  for recurrent pleural effusion and chylothorax. Catheter to be drained yesterday-1000 ml milky fluid.  Triglycerides were 1371 and total cholesterol result not available from left pleural fluid.  On 2-3 liters of oxygen via .  Encourage incentive spirometer and flutter valve 3. GI-Low fat TFs via J tube and low fat diet (as has chylothorax).  4. Acute renal insufficiency-creatinine decreased to 1.09.  5. Anemia-H and H 8.5 and 27.1 this am 6. Supplement potassium   Aaron M ZimmermanPA-C 06/21/2018,7:32 AM [phone]  Will attempt lymphangiogram to road map chylous leak and poss embolize  Feels better today On low fat, MCT diet  I have seen and examined Aaron Wang and agree with the above assessment  and plan.  Aaron Isaac MD Beeper [phone] Office [phone] 06/21/2018 8:25 AM

## 2018-06-22 ENCOUNTER — Encounter (HOSPITAL_COMMUNITY): Payer: Self-pay | Admitting: Physician Assistant

## 2018-06-22 DIAGNOSIS — I10 Essential (primary) hypertension: Secondary | ICD-10-CM

## 2018-06-22 LAB — CHOLESTEROL, BODY FLUID: Cholesterol, Fluid: 72 mg/dL

## 2018-06-22 LAB — CBC
HCT: 25.8 % — ABNORMAL LOW (ref 39.0–52.0)
Hemoglobin: 8.3 g/dL — ABNORMAL LOW (ref 13.0–17.0)
MCH: 29.6 pg (ref 26.0–34.0)
MCHC: 32.2 g/dL (ref 30.0–36.0)
MCV: 92.1 fL (ref 80.0–100.0)
Platelets: 300 10*3/uL (ref 150–400)
RBC: 2.8 MIL/uL — ABNORMAL LOW (ref 4.22–5.81)
RDW: 13.7 % (ref 11.5–15.5)
WBC: 6.4 10*3/uL (ref 4.0–10.5)
nRBC: 0 % (ref 0.0–0.2)

## 2018-06-22 LAB — COMPREHENSIVE METABOLIC PANEL
ALT: 16 U/L (ref 0–44)
AST: 16 U/L (ref 15–41)
Albumin: 2.1 g/dL — ABNORMAL LOW (ref 3.5–5.0)
Alkaline Phosphatase: 66 U/L (ref 38–126)
Anion gap: 5 (ref 5–15)
BUN: 13 mg/dL (ref 6–20)
CO2: 30 mmol/L (ref 22–32)
Calcium: 8.1 mg/dL — ABNORMAL LOW (ref 8.9–10.3)
Chloride: 103 mmol/L (ref 98–111)
Creatinine, Ser: 0.92 mg/dL (ref 0.61–1.24)
GFR calc Af Amer: >60 mL/min (ref >60)
GFR calc non Af Amer: >60 mL/min (ref >60)
Glucose, Bld: 113 mg/dL — ABNORMAL HIGH (ref 70–99)
Potassium: 3.7 mmol/L (ref 3.5–5.1)
Sodium: 138 mmol/L (ref 135–145)
Total Bilirubin: 0.3 mg/dL (ref 0.3–1.2)
Total Protein: 5 g/dL — ABNORMAL LOW (ref 6.5–8.1)

## 2018-06-22 LAB — GLUCOSE, CAPILLARY
Glucose-Capillary: 103 mg/dL — ABNORMAL HIGH (ref 70–99)
Glucose-Capillary: 117 mg/dL — ABNORMAL HIGH (ref 70–99)
Glucose-Capillary: 112 mg/dL — ABNORMAL HIGH (ref 70–99)

## 2018-06-22 LAB — BODY FLUID CULTURE: Culture: NO GROWTH

## 2018-06-22 MED ORDER — POTASSIUM CHLORIDE 20 MEQ/15ML (10%) PO SOLN
40.0000 meq | Freq: Once | ORAL | Status: AC
  Administered 2018-06-22: 40 meq
  Filled 2018-06-22: qty 30

## 2018-06-22 NOTE — Progress Notes (Signed)
Alert and oriented x 4. Out of bed and ambulating in room. Continues on Vital AF at 50 ml/hr with free water flush via peg tube. Pluer-X site remains clean, dry, and intact. Patient refused to have Pleaur-X drained despite explaining the importance. Pain management maintained. Call bell within reach. Encouraged to report signs and symptoms to staff.

## 2018-06-22 NOTE — Progress Notes (Signed)
Physical Therapy Treatment Patient Details Name: Aaron Wang MRN: [id number] DOB: 09-28-1955 Today's Date: 06/22/2018    History of Present Illness Pt is a 63 y.o. male admitted 05/29/18 with h/o esophageal cancer and increased difficulty swallowing. S/p transhiatal total esophagectomy, pyloromyotomy, J-tube insertion; extubated same day. Reintubated 2/22-2/23; and again 2/23-2/25, also with asystolic arrest requiring CPR 2/23. Pt with pleural effusion s/p thoracentesis 2/28 and 3/6.    PT Comments    Pt requiring increased encouragement/education by both RN/PT to participate in increased ambulation, but ultimately agreeable. Pt ambulating 400 feet with no assistive device and supervision. VSS on 2L O2. Suspect pt will progress well with continued mobilization and strengthening. Pt has been washing underwear/linens in his room independently and reports he feels he will be able to take care of himself at home. Updated d/c plan to HHPT.   Follow Up Recommendations  Home health PT;Supervision - Intermittent     Equipment Recommendations  None recommended by PT    Recommendations for Other Services       Precautions / Restrictions Precautions Precautions: Fall Restrictions Weight Bearing Restrictions: No    Mobility  Bed Mobility               General bed mobility comments: Sitting EOB on arrival  Transfers Overall transfer level: Modified independent Equipment used: None                Ambulation/Gait Ambulation/Gait assistance: Supervision Gait Distance (Feet): 400 Feet Assistive device: None Gait Pattern/deviations: Step-through pattern;Decreased stride length Gait velocity: Decreased   General Gait Details: Pt with slow gait requiring supervision for safety. No overt LOB   Stairs             Wheelchair Mobility    Modified Rankin (Stroke Patients Only)       Balance Overall balance assessment: Needs assistance Sitting-balance support:  Feet supported Sitting balance-Leahy Scale: Good     Standing balance support: No upper extremity supported;During functional activity Standing balance-Leahy Scale: Good                              Cognition Arousal/Alertness: Awake/alert Behavior During Therapy: WFL for tasks assessed/performed Overall Cognitive Status: Within Functional Limits for tasks assessed                                        Exercises      General Comments        Pertinent Vitals/Pain Pain Assessment: No/denies pain    Home Living                      Prior Function            PT Goals (current goals can now be found in the care plan section) Acute Rehab PT Goals Potential to Achieve Goals: Good Progress towards PT goals: Progressing toward goals    Frequency    Min 3X/week      PT Plan Discharge plan needs to be updated    Co-evaluation              AM-PAC PT "6 Clicks" Mobility   Outcome Measure  Help needed turning from your back to your side while in a flat bed without using bedrails?: None Help needed moving from lying on your back to sitting on  the side of a flat bed without using bedrails?: None Help needed moving to and from a bed to a chair (including a wheelchair)?: None Help needed standing up from a chair using your arms (e.g., wheelchair or bedside chair)?: None Help needed to walk in hospital room?: None Help needed climbing 3-5 steps with a railing? : A Little 6 Click Score: 23    End of Session Equipment Utilized During Treatment: Oxygen Activity Tolerance: Patient tolerated treatment well Patient left: in bed;with call bell/phone within reach Nurse Communication: Mobility status PT Visit Diagnosis: Other abnormalities of gait and mobility (R26.89)     Time: 5621-3086 PT Time Calculation (min) (ACUTE ONLY): 18 min  Charges:  $Therapeutic Activity: 8-22 mins                    Ellamae Sia, PT, DPT Acute  Rehabilitation Services Pager [phone] Office [phone]    Willy Eddy 06/22/2018, 1:59 PM

## 2018-06-22 NOTE — Progress Notes (Signed)
Pleur-X drained this evening. Total output 500 ml's. Color: opaque yellow. Surgical site to left lateral chest remains clean, dry, and intact.

## 2018-06-22 NOTE — Progress Notes (Addendum)
      [address]       [place] [address]             [phone]       3 Days Post-Op Procedure(s) (LRB): INSERTION PLEURAL DRAINAGE CATHETER (Left)  23  Days Post-Op Procedure(s) (LRB): TRANSHIATAL TOTAL ESOPHAGECTOMY (N/A) PYLOROMYOTOMY (N/A) FEEDING JEJUNOSTOMY (N/A) CERVICOESOPHAGO GASTROSTOMY (Right) Flexible Bronchoscopy  Subjective: Patient states occasional  abdominal pain. He denies nausea or emesis.  Objective: Vital signs in last 24 hours: Temp:  [97.3 F (36.3 C)-98.7 F (37.1 C)] 98.1 F (36.7 C) (03/12 0736) Pulse Rate:  [90] 90 (03/11 1141) Cardiac Rhythm: Normal sinus rhythm (03/12 0701) Resp:  [18-20] 18 (03/12 0444) BP: (142-162)/(83-98) 147/90 (03/12 0444) SpO2:  [95 %-96 %] 95 % (03/11 2334) Weight:  [58.7 kg] 58.7 kg (03/12 0530)     Intake/Output from previous day: 03/11 0701 - 03/12 0700 In: 1145 [P.O.:360; I.V.:20; NG/GT:765] Out: 1325 [Urine:825; Chest Tube:500]   Physical Exam:  Cardiovascular: RRR Pulmonary: Clear to auscultation on the right and slightly diminished left base Abdomen: Soft, some diffuse minor tenderness, bowel sounds present. Extremities: Trace lower extremity edema. Wounds: Clean and dry.  No erythema or signs of infection.   Lab Results: CBC: Recent Labs    06/20/18 0430 06/22/18 0444  WBC 9.6 6.4  HGB 8.5* 8.3*  HCT 27.1* 25.8*  PLT 324 300   BMET:  Recent Labs    06/21/18 0301 06/22/18 0444  NA 139 138  K 3.7 3.7  CL 103 103  CO2 30 30  GLUCOSE 116* 113*  BUN 16 13  CREATININE 1.09 0.92  CALCIUM 8.4* 8.1*    PT/INR:  Recent Labs    06/19/18 1445  LABPROT 14.6  INR 1.2   ABG:  INR: Will add last result for INR, ABG once components are confirmed Will add last 4 CBG results once components are confirmed  Assessment/Plan: 1. CV- Remains hypertensive. On Amlodipine 10 mg daily, Carvedilol 12.5 mg bid, Lisinopril 10 mg daily, and Catapres patch 0.2 mg. Cardiology  following 2. Pulmonary-S/p left Pleur X catheter 03/09  for recurrent pleural effusion and chylothorax. Catheter to be drained yesterday-500 ml milky fluid.  Triglycerides were 1371 and total cholesterol result not available from left pleural fluid.  Scheduled for embolization on Saturday. On 2-3 liters of oxygen via [place].  Encourage incentive spirometer and flutter valve 3. GI-Low fat TFs via J tube and low fat diet (as has chylothorax).  4. Acute renal insufficiency-creatinine decreased to 0.92 5. Anemia-H and H 8.3 and 25.8  this am 6. Supplement potassium   Donielle M ZimmermanPA-C 06/22/2018,8:06 AM [phone]  Patient renal function has returned to baseline With change of tube feedings -the Pleurx drainage fluid today was more yellow in color and decreased to 500 mL. We will liberalize the patient's diet tomorrow allowing increased fat intake in preparation for attempt at localization of the thoracic duct on Saturday and possible embolization. I have seen and examined Perlie Mayo and agree with the above assessment  and plan.  [name] MD Beeper [phone] Office [phone] 06/22/2018 6:08 PM

## 2018-06-22 NOTE — H&P (Signed)
Chief Complaint: Chylothorax  Referring Physician(s): Festus Barren  Supervising Physician: Corrie Mckusick  Patient Status: [facility] - In-pt  History of Present Illness: Aaron Wang is a 63 y.o. male who was admitted 05/29/18 with h/o esophageal cancer and increased difficulty swallowing.   He underwent transhiatal total esophagectomy, pyloromyotomy, J-tube insertion on 05/29/2018.  He was extubated same day.   Unfortunately his conditioned worsened and he suffered asystolic arrest requiring CPR 2/23 and was re-intubated.   He is known to our service. He has had thoracentesis 2/28 and 3/6 for recurrent chylous effusion.  We are asked to evaluate him for lymphangiography and thoracic duct embolization.  Past Medical History:  Diagnosis Date   Back pain    Carpal tunnel syndrome    Esophageal cancer (HCC)    GERD (gastroesophageal reflux disease)    Hypertension    Injury    tore ligaments left knee    Past Surgical History:  Procedure Laterality Date   BACK SURGERY  1983   L 4 L5 disectomy   CHEST TUBE INSERTION Left 05/29/2018   Procedure: LEFT CHEST TUBE INSERTION;  Surgeon: [name], MD;  Location: [place] OR;  Service: Thoracic;  Laterality: Left;   CHEST TUBE INSERTION Left 06/19/2018   Procedure: INSERTION PLEURAL DRAINAGE CATHETER;  Surgeon: [name], MD;  Location: [place];  Service: Thoracic;  Laterality: Left;   COMPLETE ESOPHAGECTOMY N/A 05/29/2018   Procedure: TRANSHIATAL TOTAL ESOPHAGECTOMY;  Surgeon: [name], MD;  Location: [place];  Service: Thoracic;  Laterality: N/A;   FLEXIBLE BRONCHOSCOPY  05/29/2018   Procedure: Flexible Bronchoscopy;  Surgeon: [name], MD;  Location: [place];  Service: Thoracic;;   IR THORACENTESIS ASP PLEURAL SPACE W/IMG GUIDE  06/16/2018   JEJUNOSTOMY N/A 05/29/2018   Procedure: FEEDING JEJUNOSTOMY;  Surgeon: [name], MD;  Location: [place] [facility];  Service: Thoracic;  Laterality: N/A;    PYLOROPLASTY N/A 05/29/2018   Procedure: Edwena Blow;  Surgeon: [name], MD;  Location: [place];  Service: Thoracic;  Laterality: N/A;   THORACOTOMY Right 05/29/2018   Procedure: CERVICOESOPHAGO GASTROSTOMY;  Surgeon: [name], MD;  Location: [facility] OR;  Service: Thoracic;  Laterality: Right;   WRIST SURGERY Bilateral    carpal tunnel    Allergies: Patient has no known allergies.  Medications: Prior to Admission medications   Medication Sig Start Date End Date Taking? Authorizing Provider  ibuprofen (ADVIL,MOTRIN) 800 MG tablet Take 1 tablet (800 mg total) by mouth 3 (three) times daily. Patient taking differently: Take 800 mg by mouth every 8 (eight) hours as needed for mild pain.  03/27/15  Yes Montine Circle, PA-C  lisinopril-hydrochlorothiazide (PRINZIDE) 20-12.5 MG per tablet Take 1 tablet by mouth daily. 04/13/12  Yes Domenic Moras, PA-C  traMADol (ULTRAM) 50 MG tablet Take 1 tablet (50 mg total) by mouth every 12 (twelve) hours as needed. Patient taking differently: Take 50 mg by mouth every 12 (twelve) hours as needed (pain).  04/26/18  Yes Owens Shark, NP     Family History  Problem Relation Age of Onset   Heart attack Mother    Colon cancer Neg Hx     Social History   Socioeconomic History   Marital status: Single    Spouse name: Not on file   Number of children: Not on file   Years of education: Not on file   Highest education level: Not on file  Occupational History   Not on  file  Social Needs   Financial resource strain: Not on file   Food insecurity:    Worry: Not on file    Inability: Not on file   Transportation needs:    Medical: Yes    Non-medical: Yes  Tobacco Use   Smoking status: Current Every Day Smoker    Packs/day: 0.25    Years: 7.00    Pack years: 1.75   Smokeless tobacco: Never Used  Substance and Sexual Activity   Alcohol use: Yes    Alcohol/week: 4.0 standard drinks    Types: 4 Cans of beer per week    Drug use: No   Sexual activity: Not Currently  Lifestyle   Physical activity:    Days per week: Not on file    Minutes per session: Not on file   Stress: Not on file  Relationships   Social connections:    Talks on phone: Not on file    Gets together: Not on file    Attends religious service: Not on file    Active member of club or organization: Not on file    Attends meetings of clubs or organizations: Not on file    Relationship status: Not on file  Other Topics Concern   Not on file  Social History Narrative   Not on file    Review of Systems  Constitutional: Positive for activity change, appetite change and unexpected weight change.  HENT: Positive for trouble swallowing.   Respiratory: Positive for shortness of breath.   Cardiovascular: Negative.   Gastrointestinal: Negative.   Genitourinary: Negative.   Musculoskeletal: Negative.   Neurological: Negative.   Hematological: Negative.   Psychiatric/Behavioral: Negative.     Vital Signs: BP (!) 147/90    Pulse 90    Temp 98.1 F (36.7 C) (Oral)    Resp 18    Ht _0  (1.702 m)    Wt 58.7 kg    SpO2 95%    BMI 20.27 kg/m   Physical Exam Vitals signs reviewed.  Constitutional:      Appearance: Normal appearance.  HENT:     Head: Normocephalic and atraumatic.  Eyes:     Extraocular Movements: Extraocular movements intact.  Neck:     Musculoskeletal: Normal range of motion.  Cardiovascular:     Rate and Rhythm: Normal rate and regular rhythm.  Pulmonary:     Effort: Pulmonary effort is normal.     Breath sounds: Normal breath sounds.  Abdominal:     Palpations: Abdomen is soft.  Musculoskeletal: Normal range of motion.  Skin:    General: Skin is warm and dry.  Neurological:     General: No focal deficit present.     Mental Status: He is alert and oriented to person, place, and time.  Psychiatric:        Mood and Affect: Mood normal.        Behavior: Behavior normal.        Thought Content: Thought  content normal.        Judgment: Judgment normal.     Imaging: Dg Chest 1 View  Result Date: 06/16/2018 CLINICAL DATA:  Post thora today ,,sore chest EXAM: CHEST  1 VIEW COMPARISON:  Chest radiograph 06/16/2018 FINDINGS: Interval decrease in volume of LEFT pleural effusion. No pneumothorax. Small RIGHT effusion remains. RIGHT lower lobe airspace disease versus atelectasis. IMPRESSION: 1. Reduction in LEFT pleural fluid following thoracentesis. No pneumothorax. 2. Persistent RIGHT effusion and RIGHT lower lobe atelectasis versus infiltrate.  Electronically Signed   By: Suzy Bouchard M.D.   On: 06/16/2018 12:31   Dg Chest 2 View  Result Date: 06/19/2018 CLINICAL DATA:  63 year old male EXAM: CHEST - 2 VIEW COMPARISON:  Prior chest x-ray 06/18/2018 FINDINGS: The tip of the right upper extremity PICC remains in good position at the superior cavoatrial junction. The cardiopericardial silhouette is largely obscured by a left basilar process. There is a moderately large left layering pleural effusion with associated dense opacification of the left mid and lower lung. Trace right pleural effusion also persists and appears smaller compared to yesterday. Upper lung predominant emphysematous changes. Diffuse mild interstitial prominence remains present. No acute osseous abnormality. IMPRESSION: 1. Persistent moderate left-sided pleural effusion with associated dense left basilar opacification which may reflect atelectasis and/or infiltrate. 2. Small right pleural effusion. 3. Mild interstitial edema superimposed on a background of emphysema/COPD. 4. Well-positioned right upper extremity PICC. Electronically Signed   By: Jacqulynn Cadet M.D.   On: 06/19/2018 08:34   Dg Chest 2 View  Result Date: 06/16/2018 CLINICAL DATA:  Postop EXAM: CHEST - 2 VIEW COMPARISON:  06/13/2018 FINDINGS: Right PICC line remains in place, unchanged. Small to moderate bilateral pleural effusions with bibasilar atelectasis. Heart is  upper limits normal in size. No acute bony abnormality. IMPRESSION: Stable small to moderate bilateral effusions and bibasilar opacities, likely atelectasis. Electronically Signed   By: Rolm Baptise M.D.   On: 06/16/2018 07:50   Dg Chest 2 View  Result Date: 06/13/2018 CLINICAL DATA:  Postop esophagectomy.  Shortness of breath. EXAM: CHEST - 2 VIEW COMPARISON:  06/12/2018 FINDINGS: A right PICC remains in place terminating over the lower SVC. The cardiomediastinal silhouette is unchanged. There are small right and small to moderate left pleural effusions, similar to the prior study allowing for differences in patient positioning. There is improved appearance of the lungs bilaterally with residual interstitial densities most notable in the right midlung. There is minimal residual left midlung density, and there is likely bibasilar atelectasis. No pneumothorax is identified. No acute osseous abnormality is seen. IMPRESSION: 1. Improved lung aeration bilaterally with mild residual interstitial densities compatible with decreased edema. 2. Small right and small to moderate left pleural effusions. Electronically Signed   By: Logan Bores M.D.   On: 06/13/2018 07:57   Dg Chest 2 View  Result Date: 05/29/2018 CLINICAL DATA:  Preop for esophageal surgery. EXAM: CHEST - 2 VIEW COMPARISON:  01/04/2018 FINDINGS: Normal heart size and mediastinal contours. No acute infiltrate or edema. No effusion or pneumothorax. No acute osseous findings. IMPRESSION: No evidence of active disease. Electronically Signed   By: Monte Fantasia M.D.   On: 05/29/2018 07:02   Dg Abd 1 View  Result Date: 06/18/2018 CLINICAL DATA:  Abdominal pain EXAM: ABDOMEN - 1 VIEW COMPARISON:  06/03/2018 abdominal radiograph FINDINGS: Catheter overlies the left lower quadrant. Surgical clips overlie the left upper quadrant. No dilated small bowel loops. Mild stool and gas in the large bowel. No evidence of pneumatosis or pneumoperitoneum. Left greater  than right pleural effusions at the lung bases. No radiopaque nephrolithiasis. IMPRESSION: 1. Nonobstructive bowel gas pattern. 2. Left greater than right pleural effusions at the lung bases. Electronically Signed   By: Ilona Sorrel M.D.   On: 06/18/2018 15:54   Dg Chest Port 1 View  Result Date: 06/20/2018 CLINICAL DATA:  Shortness of breath EXAM: PORTABLE CHEST 1 VIEW COMPARISON:  06/19/2018 FINDINGS: Right-sided PICC line is again seen and stable. PleurX catheter is now seen  in satisfactory position. Significant decrease in left-sided pleural effusion is noted. Small right effusion is seen. Bibasilar infiltrative changes are noted increased on the right when compared with the prior study. IMPRESSION: Interval PleurX catheter placement with decrease in left-sided pleural effusion. Bibasilar infiltrates increased on the right when compared with the prior study. Small right effusion is noted as well. Electronically Signed   By: Inez Catalina M.D.   On: 06/20/2018 07:03   Dg Chest Port 1 View  Result Date: 06/18/2018 CLINICAL DATA:  Evaluate pleural effusion. EXAM: PORTABLE CHEST 1 VIEW COMPARISON:  06/16/2018 FINDINGS: Right PICC line tip at low SVC. Patient rotated minimally right. Mild cardiomegaly. Small left pleural effusion is new or increased. A small right pleural effusion is similar. No pneumothorax. Moderate congestive heart failure is similar. Worsened left base aeration with persistent right base airspace disease. IMPRESSION: Similar congestive heart failure. Worsened left base aeration with new or increased pleural fluid and adjacent airspace disease. Right base airspace disease and pleural fluid are not significantly changed. Electronically Signed   By: Abigail Miyamoto M.D.   On: 06/18/2018 09:02   Dg Chest Port 1 View  Result Date: 06/12/2018 CLINICAL DATA:  Chest tube in place. EXAM: PORTABLE CHEST 1 VIEW COMPARISON:  06/11/2018 FINDINGS: Right-sided PICC line unchanged. Lungs are adequately  inflated demonstrate continued evidence of bilateral pleural effusions left greater than right without significant change. There is likely bibasilar atelectasis. Slight worsening hazy perihilar interstitial prominence likely interstitial edema. Cardiomediastinal silhouette and remainder of the exam is unchanged. IMPRESSION: Findings suggesting worsening interstitial edema with small bilateral pleural effusions left greater than right likely with associated bibasilar atelectasis. Right-sided PICC line unchanged. Electronically Signed   By: Marin Olp M.D.   On: 06/12/2018 07:44   Dg Chest Port 1 View  Result Date: 06/11/2018 CLINICAL DATA:  Postop from esophagectomy for esophageal carcinoma. Chest tube in place. EXAM: PORTABLE CHEST 1 VIEW COMPARISON:  06/10/2018 FINDINGS: Right arm PICC line remains in appropriate position. No pneumothorax visualized. Diffuse interstitial edema pattern appear stable. Decreased lung volumes are seen with increased atelectasis or infiltrate in both lung bases. Small bilateral pleural effusions show no significant change. Heart size is stable. IMPRESSION: 1. Decreased lung volumes with increased bibasilar atelectasis versus infiltrates. 2. Stable diffuse interstitial edema pattern and small bilateral pleural effusions. Electronically Signed   By: Earle Gell M.D.   On: 06/11/2018 09:05   Dg Chest Port 1 View  Result Date: 06/10/2018 CLINICAL DATA:  Follow-up chest tube EXAM: PORTABLE CHEST 1 VIEW COMPARISON:  06/09/2018 FINDINGS: Cardiac shadows within normal limits and stable. Right-sided PICC line is noted in satisfactory position. Small bilateral pleural effusions are noted. No pneumothorax is seen. Patchy opacities are again identified within the right lung. IMPRESSION: Stable bilateral pleural effusions. Patchy opacities within the right lung stable from the previous exam. Electronically Signed   By: Inez Catalina M.D.   On: 06/10/2018 09:03   Dg Chest Port 1  View  Result Date: 06/09/2018 CLINICAL DATA:  Post left-sided thoracentesis EXAM: PORTABLE CHEST 1 VIEW COMPARISON:  06/09/2018 FINDINGS: Grossly unchanged cardiac silhouette and mediastinal contours. Stable position of support apparatus. Epicardial pacer leads again overlie the cardiac apex. Interval reduction/near resolution of persistent trace left-sided effusion post thoracentesis. No pneumothorax. Unchanged small layering right-sided pleural effusion. Improved aeration of left lung with persistent left basilar opacities. Pulmonary vasculature remains indistinct within the right lung. No new focal airspace opacities. No acute osseous abnormalities. IMPRESSION: 1. Interval reduction/near  resolution of persistent trace left-sided effusion post thoracentesis. No pneumothorax. 2. Improved aeration of the left lung with persistent left basilar opacities, likely atelectasis. 3. Suspected residual asymmetric pulmonary edema within the right lung with associated small right-sided effusion. Electronically Signed   By: Sandi Mariscal M.D.   On: 06/09/2018 13:05   Dg Chest Port 1 View  Result Date: 06/09/2018 CLINICAL DATA:  Patient status post esophagectomy 05/29/2018. Shortness of breath. EXAM: PORTABLE CHEST 1 VIEW COMPARISON:  Single-view of the chest 06/08/2018 and 06/07/2018. FINDINGS: Right PICC and defibrillator pads remain in place. Right pleural effusion and extensive bilateral airspace disease persists. Smaller right effusion noted. Aeration in the left upper lobe is improved. No pneumothorax. Heart size is normal. IMPRESSION: Improved airspace disease in the left upper lobe since yesterday's examination. The appearance of the chest is otherwise unchanged with bilateral airspace disease and left greater than right effusions persisting. Electronically Signed   By: Inge Rise M.D.   On: 06/09/2018 09:43   Dg Chest Port 1 View  Result Date: 06/08/2018 CLINICAL DATA:  63 year old male with a history  esophagectomy EXAM: PORTABLE CHEST 1 VIEW COMPARISON:  06/07/2018 06/06/2018 FINDINGS: Cardiomediastinal silhouette unchanged in size and contour, partially obscured by overlying lung and pleural disease. Interval replacement defibrillator pads. Right upper extremity PICC. Relatively rapid development of interlobular septal thickening throughout with fullness in the central vasculature. This is relatively new from the plain film of 06/06/2018, worst from the most recent comparison of 24 hours. Worsening opacities at the bilateral lung bases with obscuration of the bilateral hemidiaphragm. Increasing left-sided pleural fluid. No displaced fracture IMPRESSION: Recurrence of diffuse pulmonary edema, with increasing left greater than right pleural fluid. Interval replacement of defibrillator pads. Right upper extremity PICC Electronically Signed   By: Corrie Mckusick D.O.   On: 06/08/2018 09:30   Dg Chest Port 1 View  Result Date: 06/07/2018 CLINICAL DATA:  Respiratory distress. Shortness of breath tonight. EXAM: PORTABLE CHEST 1 VIEW COMPARISON:  06/06/2018 FINDINGS: Cardiac enlargement. Diffuse interstitial infiltrates throughout the lungs likely representing interstitial edema. Small bilateral pleural effusions. No pneumothorax. Right PICC line with tip over the low SVC region. Calcification of the aorta. Mild progression since previous study. Endotracheal and enteric tubes have been removed in the interval. IMPRESSION: Cardiac enlargement with diffuse interstitial edema and small bilateral pleural effusions. Mild progression since previous study. Electronically Signed   By: Lucienne Capers M.D.   On: 06/07/2018 03:20   Dg Chest Port 1 View  Result Date: 06/06/2018 CLINICAL DATA:  Endotracheal tube present, respiratory failure, stoppage ectomy. EXAM: PORTABLE CHEST 1 VIEW COMPARISON:  Chest x-rays dated 06/05/2018 and 06/04/2018. FINDINGS: Endotracheal tube is well positioned with tip approximately 4 cm above  the carina. Presumed enteric tube passes below the diaphragm. RIGHT sided PICC line appears well positioned with tip at the level of the cavoatrial junction. Heart size and mediastinal contours appear stable. There is central pulmonary vascular congestion and mild bilateral interstitial edema, but this is significantly improved compared to the recent exams. There are persistent bibasilar opacities, likely a combination of atelectasis and small pleural effusions. No pneumothorax seen. IMPRESSION: 1. Support apparatus appears appropriately positioned. 2. Central pulmonary vascular congestion and mild bilateral interstitial edema persists, but significantly improved compared to the recent exams indicating improved fluid status. 3. Persistent bibasilar opacities, likely a combination of atelectasis and small pleural effusions. Electronically Signed   By: Franki Cabot M.D.   On: 06/06/2018 09:12   Dg Chest [facility]  1 View  Result Date: 06/05/2018 CLINICAL DATA:  Respiratory failure.  ETT. EXAM: PORTABLE CHEST 1 VIEW COMPARISON:  June 04, 2018 FINDINGS: The ETT terminates 3 cm above the carina, in good position. The side port of the NG tube is likely near the GE junction. The right PICC line terminates near the caval atrial junction. No pneumothorax. Bilateral pleural effusions remain. Diffuse bilateral pulmonary infiltrates are similar in the interval. No other interval changes. IMPRESSION: 1. Bilateral pleural effusions with underlying opacities remain. 2. Diffuse bilateral pulmonary infiltrates, possibly edema. An infectious process is not completely excluded based on imaging. 3. Support apparatus as above. Electronically Signed   By: Dorise Bullion III M.D   On: 06/05/2018 08:29   Portable Chest X-ray  Result Date: 06/04/2018 CLINICAL DATA:  Intubated EXAM: PORTABLE CHEST 1 VIEW COMPARISON:  Chest radiograph from earlier today. FINDINGS: Endotracheal tube tip is 2.0 cm above the carina. Enteric tube  terminates in the proximal stomach with the side port at the esophagogastric junction. Right subclavian central venous catheter terminates over the right atrium. Pacer pads overlie the left chest. Stable cardiomediastinal silhouette with normal heart size. No pneumothorax. Small stable bilateral pleural effusions. Severe hazy and linear opacities throughout the parahilar lungs, not appreciably changed. Bibasilar atelectasis. IMPRESSION: 1. Endotracheal tube tip 2.0 cm above the carina. 2. Enteric tube terminates in the proximal stomach with the side port at the esophagogastric junction, consider advancing 5 cm. 3. Stable severe hazy and linear opacities throughout the parahilar lungs, favor severe pulmonary edema. 4. Stable small bilateral pleural effusions and bibasilar atelectasis. Electronically Signed   By: Ilona Sorrel M.D.   On: 06/04/2018 17:50   Dg Chest Port 1 View  Result Date: 06/04/2018 CLINICAL DATA:  Status post esophagectomy 05/29/2018. Repositioning of NG tube. EXAM: PORTABLE CHEST 1 VIEW COMPARISON:  Single-view of the chest 06/03/2018 and 06/02/2018. FINDINGS: Support tubes and lines, including the patient's NG tube, are unchanged in position. Extensive bilateral airspace disease persists and has worsened on the right since yesterday's exam. There are bilateral pleural effusions. Heart size is normal. No pneumothorax. Aortic atherosclerosis noted. IMPRESSION: Support tubes and lines are unchanged in position. Extensive bilateral airspace disease persists. Aeration in the right chest is worsened. Electronically Signed   By: Inge Rise M.D.   On: 06/04/2018 11:10   Portable Chest X-ray  Result Date: 06/03/2018 CLINICAL DATA:  ET tube placement EXAM: PORTABLE CHEST 1 VIEW COMPARISON:  06/03/2018 FINDINGS: Endotracheal tube is 4 cm above the carina. Right PICC line has been placed with the tip in the SVC. NG tube tip is in the distal esophagus, stable. Heart is normal size. Bilateral  interstitial and airspace opacities slightly improved since prior study. Moderate left effusion and small right effusion are stable. IMPRESSION: Endotracheal tube tip is 4 cm above the carina. Right PICC line tip in the SVC. NG tube tip remains in the distal esophagus. Diffuse bilateral interstitial and airspace opacities slightly improved. Stable effusions. Electronically Signed   By: Rolm Baptise M.D.   On: 06/03/2018 17:03   Dg Chest Port 1 View  Result Date: 06/03/2018 CLINICAL DATA:  History of esophagectomy. EXAM: PORTABLE CHEST 1 VIEW COMPARISON:  06/02/2018 FINDINGS: No change in the bilateral airspace/interstitial lung densities. Evidence for bilateral pleural effusions, left side greater than right. Nasogastric tube extends into the upper abdomen and appears stable. Stable volume loss in left lower lobe with air bronchograms. Negative for pneumothorax. Heart size appears to be stable but obscured  by the lower chest densities. IMPRESSION: 1. Persistent bilateral parenchymal lung disease with bilateral pleural effusions. Lung densities have not significantly changed. 2. Stable position of the NG tube. Electronically Signed   By: Markus Daft M.D.   On: 06/03/2018 08:51   Dg Chest Port 1 View  Result Date: 06/03/2018 CLINICAL DATA:  63 y/o  M; increasing shortness of breath tonight. EXAM: PORTABLE CHEST 1 VIEW COMPARISON:  06/01/2018 chest radiograph FINDINGS: Stable cardiac silhouette given projection and technique. Aortic atherosclerosis. Interval removal of right central venous catheter. No residual left-sided pneumothorax identified. Increased diffuse airspace opacities and left-greater-than-right pleural effusions. Enteric tube tip extends below the field of view. Bones are unremarkable. IMPRESSION: Interval removal of right central venous catheter. No residual left-sided pneumothorax identified. Increased diffuse airspace opacities and left-greater-than-right pleural effusions. Electronically  Signed   By: Kristine Garbe M.D.   On: 06/03/2018 00:03   Dg Chest Port 1 View  Result Date: 06/01/2018 CLINICAL DATA:  Esophagectomy. Shortness of breath. EXAM: PORTABLE CHEST 1 VIEW COMPARISON:  05/31/2018. FINDINGS: Unchanged cardiomediastinal silhouette. Nasogastric tube tip remains in the upper abdomen. LEFT chest tube has been removed. There is a new small LEFT apical pneumothorax, less than 5%. LEFT pleural effusion. Central venous catheter tip remains in the SVC. Bibasilar opacities are mildly increased. IMPRESSION: LEFT chest tube removed, with small LEFT apical pneumothorax, less than 5%. Slight worsening aeration. Continued surveillance is warranted. Electronically Signed   By: Staci Righter M.D.   On: 06/01/2018 09:01   Dg Chest Port 1 View  Result Date: 05/31/2018 CLINICAL DATA:  Chest tube on the left EXAM: PORTABLE CHEST 1 VIEW COMPARISON:  Yesterday FINDINGS: Left basal chest tube is unchanged. No pneumothorax or increasing pleural fluid. Equivocal for pneumomediastinum. Right IJ line with tip at the upper cavoatrial junction. The orogastric tube tip is just below the diaphragm. Airspace opacity with air bronchograms on the right more than left. Small right pleural effusion. Surgical drain at the left apex. IMPRESSION: 1. Stable hardware positioning. 2. Unchanged collapse and/or consolidation of the right more than left lower lobes. Electronically Signed   By: Monte Fantasia M.D.   On: 05/31/2018 09:56   Dg Chest Port 1 View  Result Date: 05/30/2018 CLINICAL DATA:  Status post esophageal surgery. EXAM: PORTABLE CHEST 1 VIEW COMPARISON:  05/29/2018 FINDINGS: There is new lung base opacity, right greater than left, likely combination of pleural fluid and atelectasis. Pneumonia is possible. Remainder of the lungs demonstrates stable prominent bronchovascular markings but are otherwise clear. No pneumothorax. No mediastinal widening. Endotracheal tube has been removed. Nasogastric  tube tip projects at the expected level of the gastroesophageal junction, unchanged. Right internal jugular central venous line tip lies in the mid superior vena cava. Left inferior chest tube lies over the left hemidiaphragm. IMPRESSION: 1. Interval worsening with development of right greater than left lung base opacities, consistent with a combination of small effusions and either atelectasis or pneumonia or a combination. 2. Status post removal of the endotracheal tube. 3. No other change. Electronically Signed   By: Lajean Manes M.D.   On: 05/30/2018 08:11   Dg Chest Port 1 View  Result Date: 05/29/2018 CLINICAL DATA:  History of esophogectomy. EXAM: PORTABLE CHEST 1 VIEW COMPARISON:  May 29, 2018 FINDINGS: The heart size and mediastinal contours are stable. A breathing tube is identified with distal tip 4.6 cm from carina. A nasogastric tube is identified with distal tip probably at the GE junction. Left chest tube  is noted. Right central venous line is identified with distal tip in the superior vena cava. Both lungs are clear. There is no pneumothorax. The visualized skeletal structures are unremarkable. IMPRESSION: Life supporting devices as described. Lungs are clear. Electronically Signed   By: Abelardo Diesel M.D.   On: 05/29/2018 15:51   Dg Abd Portable 1v  Result Date: 06/03/2018 CLINICAL DATA:  Shortness of breath. EXAM: PORTABLE ABDOMEN - 1 VIEW COMPARISON:  Chest x-ray earlier same day FINDINGS: Airspace disease noted right base with left pleural effusion evident. NG tube tip is positioned in the region of the esophagogastric junction. Jejunostomy tube overlies the left abdomen. No gaseous bil dilatation to suggest obstruction. IMPRESSION: 1. Airspace disease in the right base with left pleural effusion. 2. NG tube tip is at the esophagogastric junction. Jejunostomy tube overlies the left abdomen. 3. No evidence for bowel obstruction. Electronically Signed   By: Misty Stanley M.D.   On:  06/03/2018 14:17   Dg C-arm 1-60 Min-no Report  Result Date: 06/19/2018 Fluoroscopy was utilized by the requesting physician.  No radiographic interpretation.   Vas US Renal Artery Duplex  Result Date: 06/13/2018 ABDOMINAL VISCERAL High Risk Factors: Hypertension. Performing Technologist: June Leap RDMS, RVT  Examination Guidelines: A complete evaluation includes B-mode imaging, spectral Doppler, color Doppler, and power Doppler as needed of all accessible portions of each vessel. Bilateral testing is considered an integral part of a complete examination. Limited examinations for reoccurring indications may be performed as noted.  Duplex Findings: +--------------------+--------+--------+------+--------+  Mesenteric           PSV cm/s EDV cm/s Plaque Comments  +--------------------+--------+--------+------+--------+  Aorta at SMA            52       10                     +--------------------+--------+--------+------+--------+  Celiac Artery Origin   219       55                     +--------------------+--------+--------+------+--------+  SMA Proximal            93       9                      +--------------------+--------+--------+------+--------+  +------------------+--------+--------+-------+  Right Renal Artery PSV cm/s EDV cm/s Comment  +------------------+--------+--------+-------+  Origin                63       8              +------------------+--------+--------+-------+  Proximal              54       9              +------------------+--------+--------+-------+  Mid                   56       7              +------------------+--------+--------+-------+  Distal                64       6              +------------------+--------+--------+-------+ +-----------------+--------+--------+-------+  Left Renal Artery PSV cm/s EDV cm/s Comment  +-----------------+--------+--------+-------+  Origin  56       10             +-----------------+--------+--------+-------+  Proximal             77        12             +-----------------+--------+--------+-------+  Mid                  61       10             +-----------------+--------+--------+-------+  Distal               64       9              +-----------------+--------+--------+-------+ Technologist observations: Right lobe liver hyperechoic lesion consistent with a hemangioma. Bilateral pleural effusions noted. Bilateral kidney atypical cortical appearance with dilated collecting ducts. +------------+--------+--------+----+-----------+--------+--------+----+  Right Kidney PSV cm/s EDV cm/s RI   Left Kidney PSV cm/s EDV cm/s RI    +------------+--------+--------+----+-----------+--------+--------+----+  Upper Pole   27       5        0.82 Upper Pole  27       8        0.72  +------------+--------+--------+----+-----------+--------+--------+----+  Mid          32       9        0.72 Mid         23       5        0.78  +------------+--------+--------+----+-----------+--------+--------+----+  Lower Pole   28       4        0.85 Lower Pole  37       7        0.81  +------------+--------+--------+----+-----------+--------+--------+----+  Hilar        70       9        0.87 Hilar       64       12       0.81  +------------+--------+--------+----+-----------+--------+--------+----+ +------------------+-----+------------------+-----+  Right Kidney             Left Kidney               +------------------+-----+------------------+-----+  RAR                      RAR                       +------------------+-----+------------------+-----+  RAR (manual)             RAR (manual)              +------------------+-----+------------------+-----+  Cortex                   Cortex                    +------------------+-----+------------------+-----+  Cortex thickness         Corex thickness           +------------------+-----+------------------+-----+  Kidney length (cm) 11.43 Kidney length (cm) 11.39  +------------------+-----+------------------+-----+  Summary:  Renal:  Right: No evidence of right renal artery stenosis. Abnormal right        Resistive Index. Left:  No evidence of left renal artery stenosis. Abnormal left        Resisitve Index.  *  See table(s) above for measurements and observations.  Diagnosing physician: Deitra Mayo MD  Electronically signed by Deitra Mayo MD on 06/13/2018 at 3:59:20 PM.    Final    [place] Ekg Site Rite  Result Date: 06/03/2018 If [facility] image not attached, placement could not be confirmed due to current cardiac rhythm.  Dg Esophagus W Single Cm (sol Or Thin Ba)  Addendum Date: 06/12/2018   ADDENDUM REPORT: 06/12/2018 09:57 ADDENDUM: Discussed patient with Dr. Servando Snare. The patient has a proximal anastomosis at the thoracic inlet. No evidence of leak at the proximal anastomosis. The patient does not have a distal gastric anastomosis (gastrojejunostomy). Narrowing at the gastric outlet likely reflects postsurgical change/edema related to pyloromyotomy. However, this remains patent. No evidence of aspiration. Electronically Signed   By: Julian Hy M.D.   On: 06/12/2018 09:57   Result Date: 06/12/2018 CLINICAL DATA:  Postop esophagectomy EXAM: ESOPHOGRAM/BARIUM SWALLOW TECHNIQUE: Single contrast examination was performed using  thin barium. FLUOROSCOPY TIME:  Fluoroscopy Time:  48 seconds Radiation Exposure Index (if provided by the fluoroscopic device): 11.8 mGy Number of Acquired Spot Images: 8 COMPARISON:  None. FINDINGS: Status post esophagectomy with gastric pull-through. Patent gastrojejunostomy. Anastomosis is mildly narrowed, although contrast freely flows into small bowel. No evidence of leak. IMPRESSION: Status post esophagectomy with gastric pull-through. Patent gastrojejunostomy. No evidence of leak. Electronically Signed: By: Julian Hy M.D. On: 06/12/2018 08:30   Ir Thoracentesis Asp Pleural Space W/img Guide  Result Date: 06/16/2018 INDICATION: History of recent esophagectomy for esophageal  cancer. Recurrent left-sided pleural effusion. Request diagnostic and therapeutic thoracentesis. EXAM: ULTRASOUND GUIDED LEFT THORACENTESIS MEDICATIONS: None. COMPLICATIONS: None immediate. PROCEDURE: An ultrasound guided thoracentesis was thoroughly discussed with the patient and questions answered. The benefits, risks, alternatives and complications were also discussed. The patient understands and wishes to proceed with the procedure. Written consent was obtained. Ultrasound was performed to localize and mark an adequate pocket of fluid in the left chest. The area was then prepped and draped in the normal sterile fashion. 1% Lidocaine was used for local anesthesia. Under ultrasound guidance a 6 Fr Safe-T-Centesis catheter was introduced. Thoracentesis was performed. The catheter was removed and a dressing applied. FINDINGS: A total of approximately 1.5 L of chylous yellow fluid was removed. Samples were sent to the laboratory as requested by the clinical team. IMPRESSION: Successful ultrasound guided left thoracentesis yielding 1.5 L of pleural fluid. Read by: Ascencion Dike PA-C Electronically Signed   By: Jerilynn Mages.  Shick M.D.   On: 06/16/2018 12:32   US Thoracentesis Asp Pleural Space W/img Guide  Result Date: 06/09/2018 INDICATION: Status post esophagectomy 05/29/2018. Now with shortness of breath secondary to left pleural effusion. Request for diagnostic and therapeutic thoracentesis. EXAM: ULTRASOUND GUIDED LEFT THORACENTESIS MEDICATIONS: 1% lidocaine 10 mL COMPLICATIONS: None immediate. PROCEDURE: An ultrasound guided thoracentesis was thoroughly discussed with the patient and questions answered. The benefits, risks, alternatives and complications were also discussed. The patient understands and wishes to proceed with the procedure. Written consent was obtained. Ultrasound was performed to localize and mark an adequate pocket of fluid in the left chest. The area was then prepped and draped in the normal sterile  fashion. 1% Lidocaine was used for local anesthesia. Under ultrasound guidance a 6 Fr Safe-T-Centesis catheter was introduced. Thoracentesis was performed. The catheter was removed and a dressing applied. FINDINGS: A total of approximately 1.8 L of milky yellow fluid was removed. Samples were sent to the laboratory as requested by the clinical team. IMPRESSION: Successful  ultrasound guided left thoracentesis yielding 1.8 L of pleural fluid. No pneumothorax on post-procedure chest x-ray. Read by: Gareth Eagle, PA-C Electronically Signed   By: Lucrezia Europe M.D.   On: 06/09/2018 13:05    Labs:  CBC: Recent Labs    06/18/18 1321 06/19/18 1415 06/20/18 0430 06/22/18 0444  WBC 12.0* 14.7* 9.6 6.4  HGB 9.8* 9.1* 8.5* 8.3*  HCT 29.9* 28.7* 27.1* 25.8*  PLT 416* 361 324 300    COAGS: Recent Labs    05/25/18 1615 06/19/18 1445  INR 1.10 1.2  APTT 33 25    BMP: Recent Labs    06/19/18 1415 06/20/18 0430 06/21/18 0301 06/22/18 0444  NA 140 138 139 138  K 4.1 4.0 3.7 3.7  CL 107 102 103 103  CO2 _0 GLUCOSE 121* 118* 116* 113*  BUN _1 CALCIUM 8.2* 8.2* 8.4* 8.1*  CREATININE 1.52* 1.35* 1.09 0.92  GFRNONAA 49* 57* >60 >60  GFRAA 57* >60 >60 >60    LIVER FUNCTION TESTS: Recent Labs    06/18/18 1321 06/19/18 1415 06/20/18 0430 06/22/18 0444  BILITOT 0.4 0.4 0.4 0.3  AST _2 ALT _3 ALKPHOS 65 66 68 66  PROT 5.4* 5.1* 5.0* 5.0*  ALBUMIN 2.5* 2.2* 2.2* 2.1*    TUMOR MARKERS: No results for input(s): AFPTM, CEA, CA199, CHROMGRNA in the last 8760 hours.  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  Will proceed with lymphangiography and thoracic duct embolization on Saturday by Dr. Laurence Ferrari.  Risks and benefits of thoracic duct embolization were discussed with the patient including, but not limited to bleeding, infection, lymphatic or vascular injury or contrast induced renal failure.  This interventional procedure  involves the use of X-rays and because of the nature of the planned procedure, it is possible that we will have prolonged use of X-ray fluoroscopy.  Potential radiation risks to you include (but are not limited to) the following: - A slightly elevated risk for cancer  several years later in life. This risk is typically less than 0.5% percent. This risk is low in comparison to the normal incidence of human cancer, which is 33% for women and 50% for men according to the [place]. - Radiation induced injury can include skin redness, resembling a rash, tissue breakdown / ulcers and hair loss (which can be temporary or permanent).   The likelihood of either of these occurring depends on the difficulty of the procedure and whether you are sensitive to radiation due to previous procedures, disease, or genetic conditions.   IF your procedure requires a prolonged use of radiation, you will be notified and given written instructions for further action.  It is your responsibility to monitor the irradiated area for the 2 weeks following the procedure and to notify your physician if you are concerned that you have suffered a radiation induced injury.    All of the patient's questions were answered, patient is agreeable to proceed.  Consent signed and in chart.  Thank you for this interesting consult.  I greatly enjoyed meeting ERIEL DOYON and look forward to participating in their care.  A copy of this report was sent to the requesting provider on this date.  Electronically Signed: Murrell Redden, PA-C   06/22/2018, 2:29 PM      I spent a total of 40 Minutes in face to face in clinical consultation, greater than 50% of which  was counseling/coordinating care for thoracic duct embolization.

## 2018-06-23 LAB — BASIC METABOLIC PANEL
Anion gap: 13 (ref 5–15)
BUN: 11 mg/dL (ref 6–20)
CO2: 28 mmol/L (ref 22–32)
Calcium: 8.6 mg/dL — ABNORMAL LOW (ref 8.9–10.3)
Chloride: 98 mmol/L (ref 98–111)
Creatinine, Ser: 0.89 mg/dL (ref 0.61–1.24)
GFR calc non Af Amer: >60 mL/min (ref >60)
Glucose, Bld: 110 mg/dL — ABNORMAL HIGH (ref 70–99)
Potassium: 4 mmol/L (ref 3.5–5.1)
Sodium: 139 mmol/L (ref 135–145)

## 2018-06-23 LAB — GLUCOSE, CAPILLARY
Glucose-Capillary: 101 mg/dL — ABNORMAL HIGH (ref 70–99)
Glucose-Capillary: 103 mg/dL — ABNORMAL HIGH (ref 70–99)
Glucose-Capillary: 103 mg/dL — ABNORMAL HIGH (ref 70–99)
Glucose-Capillary: 111 mg/dL — ABNORMAL HIGH (ref 70–99)
Glucose-Capillary: 96 mg/dL (ref 70–99)
Glucose-Capillary: 97 mg/dL (ref 70–99)

## 2018-06-23 NOTE — Progress Notes (Signed)
Nutrition Follow-up  DOCUMENTATION CODES:   Severe malnutrition in context of acute illness/injury  INTERVENTION:   -Continue Boost Breeze po TID, each supplement provides 250 kcal and 9 grams of protein -Continue Vital 1.5@ 43ml/hr via j-tube  68ml Prostat daily.   Continue 100 ml free water lfush every 8 hours  Tube feeding regimen provides1950kcal (100% of needs),96grams of protein, and 1261ml of H2O. Provides 40 grams of fat daily.  NUTRITION DIAGNOSIS:   Severe Malnutrition related to acute illness(s/p esophagectomy (esophageal cancer)) as evidenced by moderate fat depletion, severe fat depletion, moderate muscle depletion, mild muscle depletion.  Ongoing  GOAL:   Patient will meet greater than or equal to 90% of their needs  Progressing  MONITOR:   TF tolerance, Diet advancement, I & O's, Labs  REASON FOR ASSESSMENT:   Consult Enteral/tube feeding initiation and management  ASSESSMENT:   63 year old male w/ PMH of HTN, GERD, and esophageal cancer. On 2/17 he underwent a thoracotomy, pyloroplasty, complete esophagectomy, and had a feeding jejunostomy placed.   3/6- s/p lt thoracentesis (1.5 L chylous yellow fluid removed) 3/9- s/pPROCEDURE:Procedure(s): INSERTION PLEURAL DRAINAGE CATHETER (Left)with fluro and [place]  Reviewed I/O's: +1.7 L x 24 hours and +6.7 L since 06/09/18  Pleurex catheter output: 300 ml x 24 hours  Case discussed with RN, who reports that pt is scheduled for lymphangiography and thoracic duct embolization tomorrow (06/24/18).   Spoke with pt at bedside, who was in good spirits today. He shares that he continues to tolerate PO intake well, but eating very little (mainly fruits and jello); pt reports "my stomach is too small for much food". Noted two Boost Breeze supplements on tray table, which pt has not tried yet, but RD encouraged him to do so.   Pt reports tolerating TF well. Offered to transition to nocturnal feedings to  stimulate appetite, however, pt politely declined. Discussed rationale for low fat diet/TF secondary to chyle leak. Pt expressed understanding and appreciation for visit.   Pt continues to receive Vital 1.5@ 68ml/hr via j-tube. Also receiving 15ml Prostat daily and 100 ml free water lfush every 8 hours.Tube feeding regimen provides1950kcal (100% of needs),96grams of protein, and 1224ml of H2O (40 grams of fat daily).   Labs reviewed: CBGS: 103-111.   Diet Order:   Diet Order            Diet NPO time specified Except for: Sips with Meds  Diet effective midnight        DIET SOFT Room service appropriate? Yes with Assist; Fluid consistency: Thin  Diet effective now              EDUCATION NEEDS:   Education needs have been addressed  Skin:  Skin Assessment: Skin Integrity Issues: Skin Integrity Issues:: Other (Comment) Incisions: abdomen, neck Other: lt pleurex drain  Last BM:  06/22/18  Height:   Ht Readings from Last 1 Encounters:  05/29/18 5\' 7"  (1.702 m)    Weight:   Wt Readings from Last 1 Encounters:  06/23/18 58 kg    Ideal Body Weight:  67.27 kg  BMI:  Body mass index is 20.03 kg/m.  Estimated Nutritional Needs:   Kcal:  1850-2050  Protein:  95-110 grams  Fluid:  > 1.8 L    Aaron Wang A. Jimmye Norman, RD, LDN, [place] Registered Dietitian II Certified Diabetes Care and Education Specialist Pager: [phone] After hours Pager: [phone]

## 2018-06-23 NOTE — Progress Notes (Signed)
Patient ambulated today 425 feet using rolling walker. On room air with O2 SAT 94-96%. Pleur-X site to left lower abdomen remains intact. Dressing to site remains clean and dry.

## 2018-06-23 NOTE — Progress Notes (Signed)
CSW visit with the patient to follow up on PT recommendation of Home heath care services. Patient was pleasant and in good spirits. Patient expressed he is comfortable with going home and believes he is cable of caring for himself. He is planning on returning to his room  at the [facility] [address] [place], [place] [phone]). Patient states his room expenses has been taken care of by his step-daughter.   Thurmond Butts, MSW, [place] Social Worker [phone]

## 2018-06-23 NOTE — Progress Notes (Signed)
Patient refused to have PleurX drained this evening. Patient was educated on the importance of draining the PleurX but still refused.

## 2018-06-23 NOTE — Progress Notes (Signed)
      [address]       [place] [address]             [phone]       4 Days Post-Op Procedure(s) (LRB): INSERTION PLEURAL DRAINAGE CATHETER (Left)  23  Days Post-Op Procedure(s) (LRB): TRANSHIATAL TOTAL ESOPHAGECTOMY (N/A) PYLOROMYOTOMY (N/A) FEEDING JEJUNOSTOMY (N/A) CERVICOESOPHAGO GASTROSTOMY (Right) Flexible Bronchoscopy  Subjective: Patient states occasional episodes of abdominal pain, which he has had. He denies nausea or emesis. He states his breathing is "pretty good".  Objective: Vital signs in last 24 hours: Temp:  [98 F (36.7 C)] 98 F (36.7 C) (03/13 0350) Pulse Rate:  [83-94] 86 (03/13 0350) Cardiac Rhythm: Normal sinus rhythm (03/13 0413) Resp:  [14-18] 17 (03/13 0350) BP: (133-146)/(80-91) 146/87 (03/13 0350) SpO2:  [98 %] 98 % (03/13 0350) Weight:  [58 kg] 58 kg (03/13 0350)     Intake/Output from previous day: 03/12 0701 - 03/13 0700 In: 2255.8 [NG/GT:2255.8] Out: 600 [Urine:300; Chest Tube:300]   Physical Exam:  Cardiovascular: RRR Pulmonary: Clear to auscultation on the right and slightly diminished left base Abdomen: Soft, no tenderness, bowel sounds present. Extremities: SCDs Wounds: Clean and dry.  No erythema or signs of infection.   Lab Results: CBC: Recent Labs    06/22/18 0444  WBC 6.4  HGB 8.3*  HCT 25.8*  PLT 300   BMET:  Recent Labs    06/22/18 0444 06/23/18 0413  NA 138 139  K 3.7 4.0  CL 103 98  CO2 30 28  GLUCOSE 113* 110*  BUN 13 11  CREATININE 0.92 0.89  CALCIUM 8.1* 8.6*    PT/INR:  No results for input(s): LABPROT, INR in the last 72 hours. ABG:  INR: Will add last result for INR, ABG once components are confirmed Will add last 4 CBG results once components are confirmed  Assessment/Plan: 1. CV- Remains hypertensive. On Amlodipine 10 mg daily, Carvedilol 12.5 mg bid, Lisinopril 10 mg daily, and Catapres patch 0.2 mg.  2. Pulmonary-S/p left Pleur X catheter 03/09  for  recurrent pleural effusion and chylothorax. Catheter to be drained yesterday-500 ml milky fluid.  Triglycerides were 1371 and total cholesterol 72 from left pleural fluid.  Scheduled for embolization of chylothorax on Saturday. On 2-3 liters of oxygen via [place].  Encourage incentive spirometer and flutter valve 3. GI-Low fat TFs via J tube and low fat diet (as has chylothorax).  4. Acute renal insufficiency-creatinine decreased to 0.92 5. Anemia-H and H 8.3 and 25.8  this am     M ZimmermanPA-C 06/23/2018,7:48 AM [phone]

## 2018-06-24 ENCOUNTER — Encounter (HOSPITAL_COMMUNITY)
Admission: RE | Disposition: A | Discharge status: Home / Self Care | Payer: Self-pay | Source: Home / Self Care | Attending: Cardiothoracic Surgery

## 2018-06-24 ENCOUNTER — Inpatient Hospital Stay (HOSPITAL_COMMUNITY): Payer: Medicaid Other

## 2018-06-24 ENCOUNTER — Inpatient Hospital Stay (HOSPITAL_COMMUNITY): Payer: Medicaid Other | Admitting: Anesthesiology

## 2018-06-24 ENCOUNTER — Encounter (HOSPITAL_COMMUNITY): Payer: Self-pay | Admitting: Interventional Radiology

## 2018-06-24 HISTORY — PX: IR VENO/EXT/UNI LEFT: IMG675

## 2018-06-24 HISTORY — PX: RADIOLOGY WITH ANESTHESIA: SHX6223

## 2018-06-24 HISTORY — PX: IR LYMPHANGIOGRAM PEL/ABD BILAT: IMG5782

## 2018-06-24 HISTORY — PX: IR US GUIDE VASC ACCESS LEFT: IMG2389

## 2018-06-24 HISTORY — PX: IR US GUIDE BX ASP/DRAIN: IMG2392

## 2018-06-24 LAB — CBC
HCT: 26.4 % — ABNORMAL LOW (ref 39.0–52.0)
Hemoglobin: 8.7 g/dL — ABNORMAL LOW (ref 13.0–17.0)
MCH: 30 pg (ref 26.0–34.0)
MCHC: 33 g/dL (ref 30.0–36.0)
MCV: 91 fL (ref 80.0–100.0)
Platelets: 266 10*3/uL (ref 150–400)
RBC: 2.9 MIL/uL — ABNORMAL LOW (ref 4.22–5.81)
RDW: 13.5 % (ref 11.5–15.5)
WBC: 5.9 10*3/uL (ref 4.0–10.5)
nRBC: 0 % (ref 0.0–0.2)

## 2018-06-24 LAB — GLUCOSE, CAPILLARY
Glucose-Capillary: 98 mg/dL (ref 70–99)
Glucose-Capillary: 93 mg/dL (ref 70–99)
Glucose-Capillary: 127 mg/dL — ABNORMAL HIGH (ref 70–99)
Glucose-Capillary: 153 mg/dL — ABNORMAL HIGH (ref 70–99)

## 2018-06-24 LAB — BASIC METABOLIC PANEL
Anion gap: 7 (ref 5–15)
BUN: 9 mg/dL (ref 6–20)
CO2: 29 mmol/L (ref 22–32)
Calcium: 8.3 mg/dL — ABNORMAL LOW (ref 8.9–10.3)
Chloride: 100 mmol/L (ref 98–111)
Creatinine, Ser: 0.93 mg/dL (ref 0.61–1.24)
GFR calc Af Amer: >60 mL/min (ref >60)
GFR calc non Af Amer: >60 mL/min (ref >60)
Glucose, Bld: 100 mg/dL — ABNORMAL HIGH (ref 70–99)
Potassium: 3.7 mmol/L (ref 3.5–5.1)
SODIUM: 136 mmol/L (ref 135–145)

## 2018-06-24 SURGERY — IR WITH ANESTHESIA
Anesthesia: General

## 2018-06-24 MED ORDER — FENTANYL CITRATE (PF) 100 MCG/2ML IJ SOLN
INTRAMUSCULAR | Status: DC | PRN
  Administered 2018-06-24: 25 ug via INTRAVENOUS
  Administered 2018-06-24: 50 ug via INTRAVENOUS
  Administered 2018-06-24 (×3): 25 ug via INTRAVENOUS

## 2018-06-24 MED ORDER — ROCURONIUM BROMIDE 10 MG/ML (PF) SYRINGE
PREFILLED_SYRINGE | INTRAVENOUS | Status: DC | PRN
  Administered 2018-06-24: 30 mg via INTRAVENOUS
  Administered 2018-06-24: 20 mg via INTRAVENOUS

## 2018-06-24 MED ORDER — ONDANSETRON HCL 4 MG/2ML IJ SOLN
INTRAMUSCULAR | Status: DC | PRN
  Administered 2018-06-24: 4 mg via INTRAVENOUS

## 2018-06-24 MED ORDER — DEXAMETHASONE SODIUM PHOSPHATE 10 MG/ML IJ SOLN
INTRAMUSCULAR | Status: DC | PRN
  Administered 2018-06-24: 10 mg via INTRAVENOUS

## 2018-06-24 MED ORDER — LACTATED RINGERS IV SOLN
INTRAVENOUS | Status: DC | PRN
  Administered 2018-06-24 (×2): via INTRAVENOUS

## 2018-06-24 MED ORDER — IOPAMIDOL (ISOVUE-300) INJECTION 61%
50.0000 mL | Freq: Once | INTRAVENOUS | Status: AC | PRN
  Administered 2018-06-24: 10 mL

## 2018-06-24 MED ORDER — CEFAZOLIN SODIUM-DEXTROSE 2-4 GM/100ML-% IV SOLN
INTRAVENOUS | Status: AC
  Filled 2018-06-24: qty 100

## 2018-06-24 MED ORDER — CEFAZOLIN SODIUM-DEXTROSE 2-3 GM-%(50ML) IV SOLR
INTRAVENOUS | Status: DC | PRN
  Administered 2018-06-24: 2 g via INTRAVENOUS

## 2018-06-24 MED ORDER — SODIUM CHLORIDE 0.9 % IV SOLN
INTRAVENOUS | Status: DC | PRN
  Administered 2018-06-24: 50 ug/min via INTRAVENOUS

## 2018-06-24 MED ORDER — LIDOCAINE 2% (20 MG/ML) 5 ML SYRINGE
INTRAMUSCULAR | Status: DC | PRN
  Administered 2018-06-24: 60 mg via INTRAVENOUS

## 2018-06-24 MED ORDER — SUGAMMADEX SODIUM 200 MG/2ML IV SOLN
INTRAVENOUS | Status: DC | PRN
  Administered 2018-06-24: 150 mg via INTRAVENOUS

## 2018-06-24 MED ORDER — PROMETHAZINE HCL 25 MG/ML IJ SOLN: 6.2500 mg | INTRAMUSCULAR | Status: DC | PRN

## 2018-06-24 MED ORDER — FENTANYL CITRATE (PF) 100 MCG/2ML IJ SOLN
INTRAMUSCULAR | Status: AC
  Administered 2018-06-24: 25 ug via INTRAVENOUS
  Filled 2018-06-24: qty 2

## 2018-06-24 MED ORDER — FENTANYL CITRATE (PF) 100 MCG/2ML IJ SOLN
25.0000 ug | INTRAMUSCULAR | Status: DC | PRN
  Administered 2018-06-24 (×4): 25 ug via INTRAVENOUS

## 2018-06-24 MED ORDER — PROPOFOL 10 MG/ML IV BOLUS
INTRAVENOUS | Status: DC | PRN
  Administered 2018-06-24: 100 mg via INTRAVENOUS

## 2018-06-24 NOTE — Anesthesia Postprocedure Evaluation (Signed)
Anesthesia Post Note  Patient: Aaron Wang  Procedure(s) Performed: T duct embolization (N/A )     Patient location during evaluation: PACU Anesthesia Type: General Level of consciousness: awake and alert Pain management: pain level controlled Vital Signs Assessment: post-procedure vital signs reviewed and stable Respiratory status: spontaneous breathing, nonlabored ventilation, respiratory function stable and patient connected to nasal cannula oxygen Cardiovascular status: blood pressure returned to baseline and stable Postop Assessment: no apparent nausea or vomiting Anesthetic complications: no    Last Vitals:  Vitals:   06/24/18 1549 06/24/18 1925  BP: (!) 155/88 (!) 166/87  Pulse: 91 85  Resp: 19 18  Temp: 36.8 C (!) 36.4 C  SpO2: 100% 100%    Last Pain:  Vitals:   06/24/18 2046  TempSrc:   PainSc: 3                  Tiajuana Amass

## 2018-06-24 NOTE — Progress Notes (Signed)
      [address]       [place] [address]             [phone]      S/p fenestration of lymphatic ducts  BP (!) 155/88 (BP Location: Right Wrist)   Pulse 91   Temp 98.2 F (36.8 C) (Tympanic)   Resp 19   Ht 5\' 7"  (1.702 m)   Wt 57.1 kg   SpO2 100%   BMI 19.72 kg/m   Intake/Output Summary (Last 24 hours) at 06/24/2018 1734 Last data filed at 06/24/2018 1549 Gross per 24 hour  Intake 1350.3 ml  Output 1410 ml  Net -59.7 ml   To have pleur-x drained this evening Resume no fat MCT diet  Remo Lipps C. Roxan Hockey, MD [facility] [phone]

## 2018-06-24 NOTE — Anesthesia Preprocedure Evaluation (Signed)
Anesthesia Evaluation  Patient identified by MRN, date of birth, ID band Patient awake    Reviewed: Allergy & Precautions, NPO status , Patient's Chart, lab work & pertinent test results  Airway Mallampati: I  TM Distance: >3 FB Neck ROM: Full    Dental  (+) Teeth Intact, Dental Advisory Given   Pulmonary Current Smoker,    breath sounds clear to auscultation       Cardiovascular hypertension, Pt. on medications + CAD   Rhythm:Regular Rate:Normal     Neuro/Psych negative neurological ROS  negative psych ROS   GI/Hepatic Neg liver ROS, GERD  ,Esophageal CA s/p esophagectomy with jejunostomy tube.   Endo/Other  negative endocrine ROS  Renal/GU negative Renal ROS     Musculoskeletal  (+) Arthritis , Osteoarthritis,    Abdominal Normal abdominal exam  (+)   Peds  Hematology negative hematology ROS (+)   Anesthesia Other Findings   Reproductive/Obstetrics                             Lab Results  Component Value Date   WBC 6.4 06/22/2018   HGB 8.3 (L) 06/22/2018   HCT 25.8 (L) 06/22/2018   MCV 92.1 06/22/2018   PLT 300 06/22/2018   Lab Results  Component Value Date   CREATININE 0.93 06/24/2018   BUN 9 06/24/2018   NA 136 06/24/2018   K 3.7 06/24/2018   CL 100 06/24/2018   CO2 29 06/24/2018   Lab Results  Component Value Date   INR 1.2 06/19/2018   INR 1.10 05/25/2018   EKG: NSR  Stress Test:  Blood pressure demonstrated a hypertensive response to exercise.  There was no ST segment deviation noted during stress.  No T wave inversion was noted during stress.  No evidence of ischemia.  No symptoms of ischemia with exertion.  Anesthesia Physical  Anesthesia Plan  ASA: III  Anesthesia Plan: General   Post-op Pain Management:    Induction: Intravenous  PONV Risk Score and Plan: 2 and Ondansetron, Dexamethasone and Midazolam  Airway Management Planned: Oral  ETT  Additional Equipment:   Intra-op Plan:   Post-operative Plan:   Informed Consent: I have reviewed the patients History and Physical, chart, labs and discussed the procedure including the risks, benefits and alternatives for the proposed anesthesia with the patient or authorized representative who has indicated his/her understanding and acceptance.     Dental advisory given  Plan Discussed with: CRNA  Anesthesia Plan Comments:         Anesthesia Quick Evaluation

## 2018-06-24 NOTE — Anesthesia Procedure Notes (Signed)
Procedure Name: Intubation Date/Time: 06/24/2018 8:53 AM Performed by: Cleda Daub, CRNA Pre-anesthesia Checklist: Patient identified, Emergency Drugs available, Suction available and Patient being monitored Patient Re-evaluated:Patient Re-evaluated prior to induction Oxygen Delivery Method: Circle system utilized Preoxygenation: Pre-oxygenation with 100% oxygen Induction Type: IV induction Ventilation: Mask ventilation without difficulty and Mask ventilation throughout procedure Laryngoscope Size: Mac and 3 Grade View: Grade I Tube type: Oral Tube size: 7.5 mm Number of attempts: 1 Airway Equipment and Method: Stylet Placement Confirmation: ETT inserted through vocal cords under direct vision,  positive ETCO2 and breath sounds checked- equal and bilateral Secured at: 21 cm Tube secured with: Tape Dental Injury: Teeth and Oropharynx as per pre-operative assessment

## 2018-06-24 NOTE — Progress Notes (Signed)
Patient in IR to undergo embolization of thoracic duct. Will evaluate later

## 2018-06-24 NOTE — Transfer of Care (Signed)
Immediate Anesthesia Transfer of Care Note  Patient: Aaron Wang  Procedure(s) Performed: T duct embolization (N/A )  Patient Location: PACU  Anesthesia Type:General  Level of Consciousness: awake, alert , oriented and patient cooperative  Airway & Oxygen Therapy: Patient Spontanous Breathing and Patient connected to face mask oxygen  Post-op Assessment: Report given to RN and Post -op Vital signs reviewed and stable  Post vital signs: Reviewed and stable  Last Vitals:  Vitals Value Taken Time  BP 169/100 06/24/2018 12:39 PM  Temp    Pulse 89 06/24/2018 12:43 PM  Resp 14 06/24/2018 12:43 PM  SpO2 99 % 06/24/2018 12:43 PM  Vitals shown include unvalidated device data.  Last Pain:  Vitals:   06/24/18 0500  TempSrc: Oral  PainSc: 5       Patients Stated Pain Goal: 2 (17/51/02 5852)  Complications: No apparent anesthesia complications

## 2018-06-24 NOTE — Progress Notes (Signed)
Pleur-x drained 750 mL of cloudy yellow drainage. Surgical site to left chest CDI.

## 2018-06-24 NOTE — Procedures (Signed)
Interventional Radiology Procedure Note  Procedure:  1.) Lymphangiogram via bilateral inguinal LN access 2.) Left brachial vein access with LUE venogram 3.) Fenestration of small lymphatic ducts in the mid-epigastric region  Complications: None  Estimated Blood Loss: None  Recommendations: - CXR in am - Resume no fat diet supplemented with MCAs - COntinue to monitor pleurX output.  50-70% chance of resolution following lymphangiogram and fenestration  Signed,  Criselda Peaches, MD

## 2018-06-24 NOTE — Progress Notes (Signed)
D/c'd foley.  Pt tolerated well. Blood tinged urine noted.  Aaron Wang

## 2018-06-24 NOTE — Progress Notes (Signed)
RN verified the presence of a signed informed consent that matches stated procedure by patient. Verified armband matches patient's stated name and birth date. Verified NPO status and that all jewelry, contact, glasses, dentures, and partials had been removed (if applicable).  

## 2018-06-25 ENCOUNTER — Inpatient Hospital Stay (HOSPITAL_COMMUNITY): Payer: Medicaid Other

## 2018-06-25 LAB — GLUCOSE, CAPILLARY
Glucose-Capillary: 110 mg/dL — ABNORMAL HIGH (ref 70–99)
Glucose-Capillary: 114 mg/dL — ABNORMAL HIGH (ref 70–99)
Glucose-Capillary: 119 mg/dL — ABNORMAL HIGH (ref 70–99)
Glucose-Capillary: 120 mg/dL — ABNORMAL HIGH (ref 70–99)
Glucose-Capillary: 154 mg/dL — ABNORMAL HIGH (ref 70–99)

## 2018-06-25 MED ORDER — LISINOPRIL 20 MG PO TABS
20.0000 mg | ORAL_TABLET | Freq: Every day | ORAL | Status: DC
  Administered 2018-06-25 – 2018-07-04 (×10): 20 mg via ORAL
  Filled 2018-06-25 (×10): qty 1

## 2018-06-25 NOTE — Progress Notes (Signed)
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 Pascal Lux and Dr. Laurence Ferrari. Patient awake and alert laying in bed with no complaints at this time. Bilateral inguinal lymph node access sites and left brachial access site stable.   Allergies: Patient has no known allergies.  Medications: Prior to Admission medications   Medication Sig Start Date End Date Taking? Authorizing Provider  ibuprofen (ADVIL,MOTRIN) 800 MG tablet Take 1 tablet (800 mg total) by mouth 3 (three) times daily. Patient taking differently: Take 800 mg by mouth every 8 (eight) hours as needed for mild pain.  03/27/15  Yes Montine Circle, PA-C  lisinopril-hydrochlorothiazide (PRINZIDE) 20-12.5 MG per tablet Take 1 tablet by mouth daily. 04/13/12  Yes Domenic Moras, PA-C  traMADol (ULTRAM) 50 MG tablet Take 1 tablet (50 mg total) by mouth every 12 (twelve) hours as needed. Patient taking differently: Take 50 mg by mouth every 12 (twelve) hours as needed (pain).  04/26/18  Yes Owens Shark, NP     Vital Signs: BP (!) 150/91 (BP Location: Left Arm)    Pulse 90    Temp 98 F (36.7 C) (Oral)    Resp 20    Ht 5\' 7"  (1.702 m)    Wt 128 lb 12 oz (58.4 kg)    SpO2 99%    BMI 20.16 kg/m   Physical Exam Vitals signs and nursing note reviewed.  Constitutional:      General: He is not in acute distress.    Appearance: Normal appearance.  Pulmonary:     Effort: Pulmonary effort is normal. No respiratory distress.  Skin:    General: Skin is warm and dry.     Comments: Patient's bilateral inguinal lymph node access and left brachial access sites soft without tenderness, erythema, drainage, active bleeding, or hematoma.  Neurological:     Mental Status: He is alert and oriented to person, place, and time.    Psychiatric:        Mood and Affect: Mood normal.        Behavior: Behavior normal.        Thought Content: Thought content normal.        Judgment: Judgment normal.     Imaging: Ir Veno/ext/uni Left  Result Date: 06/24/2018 INDICATION: 63 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 EXAM: 1. Ultrasound-guided needle placement right inguinal lymph node 2. Ultrasound-guided needle placement left inguinal lymph node 3. Ultrasound-guided vascular access left brachial vein 4. Bilateral abdomen and pelvis lymphangiogram 5. Catheterization of the left subclavian vein 6. Left subclavian venogram COMPARISON:  None. MEDICATIONS: None. ANESTHESIA/SEDATION: General anesthesia FLUOROSCOPY TIME:  Fluoroscopy Time: 52 minutes 48 seconds (1166 mGy). COMPLICATIONS: SIR Level A - No therapy, no consequence. TECHNIQUE: Informed written consent was obtained from the patient after a thorough discussion of the procedural risks, benefits and alternatives. All questions were addressed. Maximal Sterile Barrier Technique was utilized including caps, mask, sterile gowns, sterile gloves, sterile drape, hand hygiene and skin antiseptic. A timeout was performed prior to the initiation of the procedure. Ultrasound was used to interrogate the right inguinal region. Multiple small lymph nodes were identified. A suitable lymph node with a visible hilum was identified. Under real-time sonographic guidance, a 25 gauge spinal needle was carefully advanced into the hilum of the lymph node. Ultrasound was then used  to interrogate the left inguinal region. Again, multiple small lymph nodes were identified. Suitable lymph node with a visible hilum was identified. Under real-time sonographic guidance, a 25 gauge spinal needle was carefully advanced into the hilum of the lymph node. Lipiodol was then slowly and gently injected through both 25 gauge spinal needles using a combination of a 12 mL syringe and IV  connecting tubing. A total of 10-15 mL Lipiodol was administered between the 2 sides. While the Lipiodol was slowly Pr cleaning up the lymphatic system, attention was turned to the left upper extremity. The left brachial vein was interrogated with ultrasound and found to be widely patent. An image was obtained and stored for the medical record. Local anesthesia was attained by infiltration with 1% lidocaine. A small dermatotomy was made. Under real-time sonographic guidance, the vessel was punctured with a 21 gauge micropuncture needle. Using standard technique, the initial micro needle was exchanged over a 0.018 micro wire for a transitional 4 [place] micro sheath. The micro sheath was then exchanged over a 0.035 wire for a 5 French vascular sheath. A C2 cobra catheter was then advanced over a Bentson wire into the left subclavian vein. Several minutes were spent attempting to cannulate the thoracic duct from the venous side. This was ultimately unsuccessful. Multiple radiographs were obtained. The pelvic nodal chain is unremarkable. In the abdomen, in the typical location of the cisterna chyli there are instead several lymph nodes with numerous small connecting channels. Several of the channels demonstrated cephalad movement of small bits of Lipiodol. Therefore, attempts were made to catheterize these relatively small channels using a combination of 22 gauge Chiba and 21 gauge trocar needles. On at least 2 occasions, we successfully punctured the very small 1 mm ducts and advanced a 014 Transcend wire several cm into the ducts. Unfortunately, the wire would not pass beyond the lymph node in the mid epigastric region. Attempts at navigate in a microcatheter into the ductal system or unsuccessful secondary to lack of adequate wire purchase. At 1 point in time, a small portion of the tip of a Transcend wire was sheared off and is located within the right retroperitoneal space in a nonvascular structure. Additional  imaging of the chest demonstrates successful visualization of the thoracic duct in the mid and upper chest. Unfortunately, the more distal thoracic duct is not visible and the thoracic duct where it enters the subclavian vein is not visible. The visualized portions of the thoracic duct are not accessible percutaneously. FINDINGS: Successful lymphangiogram and partial fenestration of small lymphatic ducts in the mid epigastric region. Unfortunately, the thoracic duct is not accessible percutaneously. Unsuccessful attempted catheterization of the thoracic duct from the subclavian vein. IMPRESSION: Successful lymphangiogram with fenestration of small lymphatic ducts in the mid epigastric region. Unsuccessful thoracic duct embolization secondary to patient anatomy. Signed, Criselda Peaches, MD, RPVI and Linus Mako. Pascal Lux, MD [facility] Rad[facility] Electronically Signed   By: Jacqulynn Cadet M.D.   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 EXAM: 1. Ultrasound-guided needle placement right inguinal lymph node 2. Ultrasound-guided needle placement left inguinal lymph node 3. Ultrasound-guided vascular access left brachial vein 4. Bilateral abdomen and pelvis lymphangiogram 5. Catheterization of the left subclavian vein 6. Left subclavian venogram COMPARISON:  None. MEDICATIONS: None. ANESTHESIA/SEDATION: General anesthesia FLUOROSCOPY TIME:  Fluoroscopy Time: 52 minutes 48 seconds (1166 mGy). COMPLICATIONS:  SIR Level A - No therapy, no consequence. TECHNIQUE: Informed written consent was obtained from the patient after a thorough discussion of the procedural risks, benefits and alternatives. All questions were addressed. Maximal Sterile Barrier Technique was utilized including caps, mask, sterile gowns, sterile gloves, sterile drape, hand hygiene  and skin antiseptic. A timeout was performed prior to the initiation of the procedure. Ultrasound was used to interrogate the right inguinal region. Multiple small lymph nodes were identified. A suitable lymph node with a visible hilum was identified. Under real-time sonographic guidance, a 25 gauge spinal needle was carefully advanced into the hilum of the lymph node. Ultrasound was then used to interrogate the left inguinal region. Again, multiple small lymph nodes were identified. Suitable lymph node with a visible hilum was identified. Under real-time sonographic guidance, a 25 gauge spinal needle was carefully advanced into the hilum of the lymph node. Lipiodol was then slowly and gently injected through both 25 gauge spinal needles using a combination of a 12 mL syringe and IV connecting tubing. A total of 10-15 mL Lipiodol was administered between the 2 sides. While the Lipiodol was slowly Pr cleaning up the lymphatic system, attention was turned to the left upper extremity. The left brachial vein was interrogated with ultrasound and found to be widely patent. An image was obtained and stored for the medical record. Local anesthesia was attained by infiltration with 1% lidocaine. A small dermatotomy was made. Under real-time sonographic guidance, the vessel was punctured with a 21 gauge micropuncture needle. Using standard technique, the initial micro needle was exchanged over a 0.018 micro wire for a transitional 4 [place] micro sheath. The micro sheath was then exchanged over a 0.035 wire for a 5 French vascular sheath. A C2 cobra catheter was then advanced over a Bentson wire into the left subclavian vein. Several minutes were spent attempting to cannulate the thoracic duct from the venous side. This was ultimately unsuccessful. Multiple radiographs were obtained. The pelvic nodal chain is unremarkable. In the abdomen, in the typical location of the cisterna chyli there are instead several lymph nodes with  numerous small connecting channels. Several of the channels demonstrated cephalad movement of small bits of Lipiodol. Therefore, attempts were made to catheterize these relatively small channels using a combination of 22 gauge Chiba and 21 gauge trocar needles. On at least 2 occasions, we successfully punctured the very small 1 mm ducts and advanced a 014 Transcend wire several cm into the ducts. Unfortunately, the wire would not pass beyond the lymph node in the mid epigastric region. Attempts at navigate in a microcatheter into the ductal system or unsuccessful secondary to lack of adequate wire purchase. At 1 point in time, a small portion of the tip of a Transcend wire was sheared off and is located within the right retroperitoneal space in a nonvascular structure. Additional imaging of the chest demonstrates successful visualization of the thoracic duct in the mid and upper chest. Unfortunately, the more distal thoracic duct is not visible and the thoracic duct where it enters the subclavian vein is not visible. The visualized portions of the thoracic duct are not accessible percutaneously. FINDINGS: Successful lymphangiogram and partial fenestration of small lymphatic ducts in the mid epigastric region. Unfortunately, the thoracic duct is not accessible percutaneously. Unsuccessful attempted catheterization of the thoracic duct from the subclavian vein. IMPRESSION: Successful lymphangiogram with fenestration of small lymphatic ducts in the mid epigastric region. Unsuccessful thoracic duct embolization secondary to patient anatomy. Signed, Criselda Peaches, MD,  RPVI and John A. Pascal Lux, MD [facility] Radiology Electronically Signed   By: Jacqulynn Cadet M.D.   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 EXAM: 1. Ultrasound-guided  needle placement right inguinal lymph node 2. Ultrasound-guided needle placement left inguinal lymph node 3. Ultrasound-guided vascular access left brachial vein 4. Bilateral abdomen and pelvis lymphangiogram 5. Catheterization of the left subclavian vein 6. Left subclavian venogram COMPARISON:  None. MEDICATIONS: None. ANESTHESIA/SEDATION: General anesthesia FLUOROSCOPY TIME:  Fluoroscopy Time: 52 minutes 48 seconds (1166 mGy). COMPLICATIONS: SIR Level A - No therapy, no consequence. TECHNIQUE: Informed written consent was obtained from the patient after a thorough discussion of the procedural risks, benefits and alternatives. All questions were addressed. Maximal Sterile Barrier Technique was utilized including caps, mask, sterile gowns, sterile gloves, sterile drape, hand hygiene and skin antiseptic. A timeout was performed prior to the initiation of the procedure. Ultrasound was used to interrogate the right inguinal region. Multiple small lymph nodes were identified. A suitable lymph node with a visible hilum was identified. Under real-time sonographic guidance, a 25 gauge spinal needle was carefully advanced into the hilum of the lymph node. Ultrasound was then used to interrogate the left inguinal region. Again, multiple small lymph nodes were identified. Suitable lymph node with a visible hilum was identified. Under real-time sonographic guidance, a 25 gauge spinal needle was carefully advanced into the hilum of the lymph node. Lipiodol was then slowly and gently injected through both 25 gauge spinal needles using a combination of a 12 mL syringe and IV connecting tubing. A total of 10-15 mL Lipiodol was administered between the 2 sides. While the Lipiodol was slowly Pr cleaning up the lymphatic system, attention was turned to the left upper extremity. The left brachial vein was interrogated with ultrasound and found to be widely patent. An image was obtained and stored for the medical record. Local  anesthesia was attained by infiltration with 1% lidocaine. A small dermatotomy was made. Under real-time sonographic guidance, the vessel was punctured with a 21 gauge micropuncture needle. Using standard technique, the initial micro needle was exchanged over a 0.018 micro wire for a transitional 4 [place] micro sheath. The micro sheath was then exchanged over a 0.035 wire for a 5 French vascular sheath. A C2 cobra catheter was then advanced over a Bentson wire into the left subclavian vein. Several minutes were spent attempting to cannulate the thoracic duct from the venous side. This was ultimately unsuccessful. Multiple radiographs were obtained. The pelvic nodal chain is unremarkable. In the abdomen, in the typical location of the cisterna chyli there are instead several lymph nodes with numerous small connecting channels. Several of the channels demonstrated cephalad movement of small bits of Lipiodol. Therefore, attempts were made to catheterize these relatively small channels using a combination of 22 gauge Chiba and 21 gauge trocar needles. On at least 2 occasions, we successfully punctured the very small 1 mm ducts and advanced a 014 Transcend wire several cm into the ducts. Unfortunately, the wire would not pass beyond the lymph node in the mid epigastric region. Attempts at navigate in a microcatheter into the ductal system or unsuccessful secondary to lack of adequate wire purchase. At 1 point in time, a small portion of the tip of a Transcend wire was sheared off and is located within the right retroperitoneal space in a nonvascular structure. Additional  imaging of the chest demonstrates successful visualization of the thoracic duct in the mid and upper chest. Unfortunately, the more distal thoracic duct is not visible and the thoracic duct where it enters the subclavian vein is not visible. The visualized portions of the thoracic duct are not accessible percutaneously. FINDINGS: Successful  lymphangiogram and partial fenestration of small lymphatic ducts in the mid epigastric region. Unfortunately, the thoracic duct is not accessible percutaneously. Unsuccessful attempted catheterization of the thoracic duct from the subclavian vein. IMPRESSION: Successful lymphangiogram with fenestration of small lymphatic ducts in the mid epigastric region. Unsuccessful thoracic duct embolization secondary to patient anatomy. Signed, Criselda Peaches, MD, RPVI and Linus Mako. Pascal Lux, MD [facility] Radiology Electronically Signed   By: Jacqulynn Cadet M.D.   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 EXAM: 1. Ultrasound-guided needle placement right inguinal lymph node 2. Ultrasound-guided needle placement left inguinal lymph node 3. Ultrasound-guided vascular access left brachial vein 4. Bilateral abdomen and pelvis lymphangiogram 5. Catheterization of the left subclavian vein 6. Left subclavian venogram COMPARISON:  None. MEDICATIONS: None. ANESTHESIA/SEDATION: General anesthesia FLUOROSCOPY TIME:  Fluoroscopy Time: 52 minutes 48 seconds (1166 mGy). COMPLICATIONS: SIR Level A - No therapy, no consequence. TECHNIQUE: Informed written consent was obtained from the patient after a thorough discussion of the procedural risks, benefits and alternatives. All questions were addressed. Maximal Sterile Barrier Technique was utilized including caps, mask, sterile gowns, sterile gloves, sterile drape, hand hygiene and skin antiseptic. A timeout was performed prior to the initiation of the procedure. Ultrasound was used to interrogate the right inguinal region. Multiple small lymph nodes were identified. A suitable lymph node with a visible hilum was identified. Under real-time sonographic guidance, a 25 gauge spinal needle was carefully advanced into  the hilum of the lymph node. Ultrasound was then used to interrogate the left inguinal region. Again, multiple small lymph nodes were identified. Suitable lymph node with a visible hilum was identified. Under real-time sonographic guidance, a 25 gauge spinal needle was carefully advanced into the hilum of the lymph node. Lipiodol was then slowly and gently injected through both 25 gauge spinal needles using a combination of a 12 mL syringe and IV connecting tubing. A total of 10-15 mL Lipiodol was administered between the 2 sides. While the Lipiodol was slowly Pr cleaning up the lymphatic system, attention was turned to the left upper extremity. The left brachial vein was interrogated with ultrasound and found to be widely patent. An image was obtained and stored for the medical record. Local anesthesia was attained by infiltration with 1% lidocaine. A small dermatotomy was made. Under real-time sonographic guidance, the vessel was punctured with a 21 gauge micropuncture needle. Using standard technique, the initial micro needle was exchanged over a 0.018 micro wire for a transitional 4 [place] micro sheath. The micro sheath was then exchanged over a 0.035 wire for a 5 French vascular sheath. A C2 cobra catheter was then advanced over a Bentson wire into the left subclavian vein. Several minutes were spent attempting to cannulate the thoracic duct from the venous side. This was ultimately unsuccessful. Multiple radiographs were obtained. The pelvic nodal chain is unremarkable. In the abdomen, in the typical location of the cisterna chyli there are instead several lymph nodes with numerous small connecting channels. Several of the channels demonstrated cephalad movement of small bits of Lipiodol. Therefore,  attempts were made to catheterize these relatively small channels using a combination of 22 gauge Chiba and 21 gauge trocar needles. On at least 2 occasions, we successfully punctured the very small 1 mm ducts and  advanced a 014 Transcend wire several cm into the ducts. Unfortunately, the wire would not pass beyond the lymph node in the mid epigastric region. Attempts at navigate in a microcatheter into the ductal system or unsuccessful secondary to lack of adequate wire purchase. At 1 point in time, a small portion of the tip of a Transcend wire was sheared off and is located within the right retroperitoneal space in a nonvascular structure. Additional imaging of the chest demonstrates successful visualization of the thoracic duct in the mid and upper chest. Unfortunately, the more distal thoracic duct is not visible and the thoracic duct where it enters the subclavian vein is not visible. The visualized portions of the thoracic duct are not accessible percutaneously. FINDINGS: Successful lymphangiogram and partial fenestration of small lymphatic ducts in the mid epigastric region. Unfortunately, the thoracic duct is not accessible percutaneously. Unsuccessful attempted catheterization of the thoracic duct from the subclavian vein. IMPRESSION: Successful lymphangiogram with fenestration of small lymphatic ducts in the mid epigastric region. Unsuccessful thoracic duct embolization secondary to patient anatomy. Signed, Criselda Peaches, MD, RPVI and Linus Mako. Pascal Lux, MD [facility] Special[facility] Radiology Electronically Signed   By: Jacqulynn Cadet M.D.   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 EXAM: 1. Ultrasound-guided needle placement right inguinal lymph node 2. Ultrasound-guided needle placement left inguinal lymph node 3. Ultrasound-guided vascular access left brachial vein 4. Bilateral abdomen and pelvis lymphangiogram 5. Catheterization of the left subclavian vein 6. Left subclavian venogram COMPARISON:  None. MEDICATIONS: None. ANESTHESIA/SEDATION:  General anesthesia FLUOROSCOPY TIME:  Fluoroscopy Time: 52 minutes 48 seconds (1166 mGy). COMPLICATIONS: SIR Level A - No therapy, no consequence. TECHNIQUE: Informed written consent was obtained from the patient after a thorough discussion of the procedural risks, benefits and alternatives. All questions were addressed. Maximal Sterile Barrier Technique was utilized including caps, mask, sterile gowns, sterile gloves, sterile drape, hand hygiene and skin antiseptic. A timeout was performed prior to the initiation of the procedure. Ultrasound was used to interrogate the right inguinal region. Multiple small lymph nodes were identified. A suitable lymph node with a visible hilum was identified. Under real-time sonographic guidance, a 25 gauge spinal needle was carefully advanced into the hilum of the lymph node. Ultrasound was then used to interrogate the left inguinal region. Again, multiple small lymph nodes were identified. Suitable lymph node with a visible hilum was identified. Under real-time sonographic guidance, a 25 gauge spinal needle was carefully advanced into the hilum of the lymph node. Lipiodol was then slowly and gently injected through both 25 gauge spinal needles using a combination of a 12 mL syringe and IV connecting tubing. A total of 10-15 mL Lipiodol was administered between the 2 sides. While the Lipiodol was slowly Pr cleaning up the lymphatic system, attention was turned to the left upper extremity. The left brachial vein was interrogated with ultrasound and found to be widely patent. An image was obtained and stored for the medical record. Local anesthesia was attained by infiltration with 1% lidocaine. A small dermatotomy was made. Under real-time sonographic guidance, the vessel was punctured with a 21 gauge micropuncture needle. Using standard technique, the  initial micro needle was exchanged over a 0.018 micro wire for a transitional 4 [place] micro sheath. The micro sheath was then  exchanged over a 0.035 wire for a 5 French vascular sheath. A C2 cobra catheter was then advanced over a Bentson wire into the left subclavian vein. Several minutes were spent attempting to cannulate the thoracic duct from the venous side. This was ultimately unsuccessful. Multiple radiographs were obtained. The pelvic nodal chain is unremarkable. In the abdomen, in the typical location of the cisterna chyli there are instead several lymph nodes with numerous small connecting channels. Several of the channels demonstrated cephalad movement of small bits of Lipiodol. Therefore, attempts were made to catheterize these relatively small channels using a combination of 22 gauge Chiba and 21 gauge trocar needles. On at least 2 occasions, we successfully punctured the very small 1 mm ducts and advanced a 014 Transcend wire several cm into the ducts. Unfortunately, the wire would not pass beyond the lymph node in the mid epigastric region. Attempts at navigate in a microcatheter into the ductal system or unsuccessful secondary to lack of adequate wire purchase. At 1 point in time, a small portion of the tip of a Transcend wire was sheared off and is located within the right retroperitoneal space in a nonvascular structure. Additional imaging of the chest demonstrates successful visualization of the thoracic duct in the mid and upper chest. Unfortunately, the more distal thoracic duct is not visible and the thoracic duct where it enters the subclavian vein is not visible. The visualized portions of the thoracic duct are not accessible percutaneously. FINDINGS: Successful lymphangiogram and partial fenestration of small lymphatic ducts in the mid epigastric region. Unfortunately, the thoracic duct is not accessible percutaneously. Unsuccessful attempted catheterization of the thoracic duct from the subclavian vein. IMPRESSION: Successful lymphangiogram with fenestration of small lymphatic ducts in the mid epigastric region.  Unsuccessful thoracic duct embolization secondary to patient anatomy. Signed, Criselda Peaches, MD, RPVI and Linus Mako. Pascal Lux, MD [facility] Electronically Signed   By: Jacqulynn Cadet M.D.   On: 06/24/2018 13:25   Dg Chest Port 1 View  Result Date: 06/25/2018 CLINICAL DATA:  Follow-up pleural effusion. EXAM: PORTABLE CHEST 1 VIEW COMPARISON:  06/24/2018 FINDINGS: Left chest tube remains in place. Right arm PICC tip in the SVC above the right atrium. There is less pleural fluid on the left with better aeration of the left lower lung. Persistent right effusion with right lower lung volume loss. Lymphangiogram contrast is present more notably in the left mediastinal and hilar region and in the left supraclavicular region. IMPRESSION: Less pleural fluid on the left with better aeration of the left lower lung. Similar appearance of pleural fluid on the right with right lower lung volume loss. No pleural air identified. Electronically Signed   By: Nelson Chimes M.D.   On: 06/25/2018 07:15   Ir Lymphangiogram Pel/abd Bilat  Result Date: 06/24/2018 INDICATION: 63 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 EXAM: 1. Ultrasound-guided needle placement right inguinal lymph node 2. Ultrasound-guided needle placement left inguinal lymph node 3. Ultrasound-guided vascular access left brachial vein 4. Bilateral abdomen and pelvis lymphangiogram 5. Catheterization of the left subclavian vein 6. Left subclavian venogram COMPARISON:  None. MEDICATIONS: None. ANESTHESIA/SEDATION: General anesthesia FLUOROSCOPY TIME:  Fluoroscopy Time: 52 minutes 48 seconds (1166 mGy). COMPLICATIONS: SIR Level A - No therapy, no consequence. TECHNIQUE: Informed written consent was obtained from the patient after a thorough discussion  of the procedural risks, benefits and alternatives. All questions were addressed. Maximal Sterile Barrier Technique was  utilized including caps, mask, sterile gowns, sterile gloves, sterile drape, hand hygiene and skin antiseptic. A timeout was performed prior to the initiation of the procedure. Ultrasound was used to interrogate the right inguinal region. Multiple small lymph nodes were identified. A suitable lymph node with a visible hilum was identified. Under real-time sonographic guidance, a 25 gauge spinal needle was carefully advanced into the hilum of the lymph node. Ultrasound was then used to interrogate the left inguinal region. Again, multiple small lymph nodes were identified. Suitable lymph node with a visible hilum was identified. Under real-time sonographic guidance, a 25 gauge spinal needle was carefully advanced into the hilum of the lymph node. Lipiodol was then slowly and gently injected through both 25 gauge spinal needles using a combination of a 12 mL syringe and IV connecting tubing. A total of 10-15 mL Lipiodol was administered between the 2 sides. While the Lipiodol was slowly Pr cleaning up the lymphatic system, attention was turned to the left upper extremity. The left brachial vein was interrogated with ultrasound and found to be widely patent. An image was obtained and stored for the medical record. Local anesthesia was attained by infiltration with 1% lidocaine. A small dermatotomy was made. Under real-time sonographic guidance, the vessel was punctured with a 21 gauge micropuncture needle. Using standard technique, the initial micro needle was exchanged over a 0.018 micro wire for a transitional 4 [place] micro sheath. The micro sheath was then exchanged over a 0.035 wire for a 5 French vascular sheath. A C2 cobra catheter was then advanced over a Bentson wire into the left subclavian vein. Several minutes were spent attempting to cannulate the thoracic duct from the venous side. This was ultimately unsuccessful. Multiple radiographs were obtained. The pelvic nodal chain is unremarkable. In the abdomen,  in the typical location of the cisterna chyli there are instead several lymph nodes with numerous small connecting channels. Several of the channels demonstrated cephalad movement of small bits of Lipiodol. Therefore, attempts were made to catheterize these relatively small channels using a combination of 22 gauge Chiba and 21 gauge trocar needles. On at least 2 occasions, we successfully punctured the very small 1 mm ducts and advanced a 014 Transcend wire several cm into the ducts. Unfortunately, the wire would not pass beyond the lymph node in the mid epigastric region. Attempts at navigate in a microcatheter into the ductal system or unsuccessful secondary to lack of adequate wire purchase. At 1 point in time, a small portion of the tip of a Transcend wire was sheared off and is located within the right retroperitoneal space in a nonvascular structure. Additional imaging of the chest demonstrates successful visualization of the thoracic duct in the mid and upper chest. Unfortunately, the more distal thoracic duct is not visible and the thoracic duct where it enters the subclavian vein is not visible. The visualized portions of the thoracic duct are not accessible percutaneously. FINDINGS: Successful lymphangiogram and partial fenestration of small lymphatic ducts in the mid epigastric region. Unfortunately, the thoracic duct is not accessible percutaneously. Unsuccessful attempted catheterization of the thoracic duct from the subclavian vein. IMPRESSION: Successful lymphangiogram with fenestration of small lymphatic ducts in the mid epigastric region. Unsuccessful thoracic duct embolization secondary to patient anatomy. Signed, Criselda Peaches, MD, RPVI and Linus Mako. Pascal Lux, MD [facility] Electronically Signed   By: Jacqulynn Cadet  M.D.   On: 06/24/2018 13:25    Labs:  CBC: Recent Labs    06/19/18 1415 06/20/18 0430 06/22/18 0444  06/24/18 0706  WBC 14.7* 9.6 6.4 5.9  HGB 9.1* 8.5* 8.3* 8.7*  HCT 28.7* 27.1* 25.8* 26.4*  PLT 361 324 300 266    COAGS: Recent Labs    05/25/18 1615 06/19/18 1445  INR 1.10 1.2  APTT 33 25    BMP: Recent Labs    06/21/18 0301 06/22/18 0444 06/23/18 0413 06/24/18 0500  NA 139 138 139 136  K 3.7 3.7 4.0 3.7  CL 103 103 98 100  CO2 30 30 28 29   GLUCOSE 116* 113* 110* 100*  BUN 16 13 11 9   CALCIUM 8.4* 8.1* 8.6* 8.3*  CREATININE 1.09 0.92 0.89 0.93  GFRNONAA >60 >60 >60 >60  GFRAA >60 >60 >60 >60    LIVER FUNCTION TESTS: Recent Labs    06/18/18 1321 06/19/18 1415 06/20/18 0430 06/22/18 0444  BILITOT 0.4 0.4 0.4 0.3  AST 23 22 18 16   ALT 26 25 19 16   ALKPHOS 65 66 68 66  PROT 5.4* 5.1* 5.0* 5.0*  ALBUMIN 2.5* 2.2* 2.2* 2.1*    Assessment and Plan:  Chylous pleural effusion s/p attempted embolization (unsuccessful secondary to patient anatomy) 06/24/2018 by Dr. Pascal Lux and Dr. Laurence Ferrari. Patient's bilateral inguinal lymph node access and left brachial access sites stable. Appreciate and agree with CT surgery management. Please call IR with questions/concerns.   Electronically Signed: Earley Abide, PA-C 06/25/2018, 1:03 PM   I spent a total of 25 Minutes at the the patient's bedside AND on the patient's hospital floor or unit, greater than 50% of which was counseling/coordinating care for chylous pleural effusion s/p attempted embolization.

## 2018-06-25 NOTE — Progress Notes (Signed)
Patient complaining of increased right sided rib and abdominal discomfort.  States, "I just hurt all over".  Also states he wants to limit his activity today and rest.  Pain medication offered, but refused for now.  Will re-assess and progress

## 2018-06-25 NOTE — Plan of Care (Signed)
  Problem: Education: Goal: Knowledge of the prescribed therapeutic regimen will improve Outcome: Progressing   Problem: Bowel/Gastric: Goal: Gastrointestinal status for postoperative course will improve Outcome: Progressing   Problem: Nutritional: Goal: Ability to achieve adequate nutritional intake will improve Outcome: Progressing   Problem: Clinical Measurements: Goal: Postoperative complications will be avoided or minimized Outcome: Progressing   Problem: Respiratory: Goal: Ability to maintain a clear airway will improve Outcome: Adequate for Discharge   Problem: Skin Integrity: Goal: Demonstration of wound healing without infection will improve Outcome: Progressing   Problem: Education: Goal: Knowledge of General Education information will improve Description Including pain rating scale, medication(s)/side effects and non-pharmacologic comfort measures Outcome: Progressing   Problem: Clinical Measurements: Goal: Ability to maintain clinical measurements within normal limits will improve Outcome: Progressing Goal: Will remain free from infection Outcome: Progressing Goal: Diagnostic test results will improve Outcome: Progressing Goal: Respiratory complications will improve Outcome: Progressing   Problem: Activity: Goal: Risk for activity intolerance will decrease Outcome: Progressing  Patient ambulated 260 feet in hallway with SBA.   Problem: Coping: Goal: Level of anxiety will decrease Outcome: Progressing  Verbalizing anxiety with staff.    Problem: Elimination: Goal: Will not experience complications related to bowel motility Outcome: Progressing Consistent bowel pattern. Goal: Will not experience complications related to urinary retention Outcome: Completed/Met  Foley catheter discontinued   Problem: Safety: Goal: Ability to remain free from injury will improve Outcome: Progressing

## 2018-06-25 NOTE — Progress Notes (Addendum)
      [address]       [place] [address]             [phone]       1 Day Post-Op Procedure(s) (LRB): T duct embolization (N/A)  23  Days Post-Op Procedure(s) (LRB): TRANSHIATAL TOTAL ESOPHAGECTOMY (N/A) PYLOROMYOTOMY (N/A) FEEDING JEJUNOSTOMY (N/A) CERVICOESOPHAGO GASTROSTOMY (Right) Flexible Bronchoscopy  Subjective: Patient states he has pain in groins and along right side of abdomen (had fenestration of lymphatic ducts yesterday)  Objective: Vital signs in last 24 hours: Temp:  [97.2 F (36.2 C)-98.2 F (36.8 C)] 98.1 F (36.7 C) (03/15 0710) Pulse Rate:  [82-93] 86 (03/15 0710) Cardiac Rhythm: Normal sinus rhythm (03/15 0710) Resp:  [11-26] 15 (03/15 0710) BP: (145-169)/(86-100) 158/87 (03/15 0710) SpO2:  [91 %-100 %] 100 % (03/15 0710) Weight:  [58.4 kg] 58.4 kg (03/15 0455)     Intake/Output from previous day: 03/14 0701 - 03/15 0700 In: 1733 [P.O.:120; I.V.:1100; NG/GT:513] Out: 1960 [Urine:1200; Blood:10; Chest Tube:750]   Physical Exam:  Cardiovascular: RRR Pulmonary: Clear to auscultation on the right and slightly diminished left base Abdomen: Soft, right sided tenderness, bowel sounds present. Extremities: No LE edema Wounds: Clean and dry.  No erythema or signs of infection. Both groin wound dressings are dry and intact. Minor swelling right groin.   Lab Results: CBC: Recent Labs    06/24/18 0706  WBC 5.9  HGB 8.7*  HCT 26.4*  PLT 266   BMET:  Recent Labs    06/23/18 0413 06/24/18 0500  NA 139 136  K 4.0 3.7  CL 98 100  CO2 28 29  GLUCOSE 110* 100*  BUN 11 9  CREATININE 0.89 0.93  CALCIUM 8.6* 8.3*    PT/INR:  No results for input(s): LABPROT, INR in the last 72 hours. ABG:  INR: Will add last result for INR, ABG once components are confirmed Will add last 4 CBG results once components are confirmed  Assessment/Plan: 1. CV- Hypertensive this am. On Amlodipine 10 mg daily, Carvedilol 12.5 mg bid,  Lisinopril 10 mg daily, and Catapres patch 0.2 mg. Could increase Lisinopril (creatinine yesterday 0.93) 2. Pulmonary-S/p lymphangiogram, fenestration of small lymphatic ducts mid epigastric region 03/14. 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 Catheter to be drained yesterday-750 ml cloudy drainage. Triglycerides were 1371 and total cholesterol 72 from left pleural fluid. CXR this am shows decreased left pleural effusion. On 2-3 liters of oxygen via [place].  Encourage incentive spirometer and flutter valve 3. GI-Low fat TFs via J tube and no fat MCT diet 4. Anemia-Last H and H 8.3 and 25.8 . Recheck in am    Sharalyn Ink [facility] 06/25/2018,8:21 AM [phone] Patient seen and examined, agree with above Daily pleur-x catheter drainage Needs to increase activity  Remo Lipps C. Roxan Hockey, MD [facility] [phone]

## 2018-06-25 NOTE — Progress Notes (Signed)
Pleurex drained of 675 ml. serossanguinous fluid.

## 2018-06-26 ENCOUNTER — Inpatient Hospital Stay (HOSPITAL_COMMUNITY): Payer: Medicaid Other

## 2018-06-26 ENCOUNTER — Encounter (HOSPITAL_COMMUNITY): Payer: Self-pay | Admitting: Interventional Radiology

## 2018-06-26 LAB — BASIC METABOLIC PANEL
Anion gap: 6 (ref 5–15)
BUN: 9 mg/dL (ref 6–20)
CO2: 29 mmol/L (ref 22–32)
Calcium: 8.1 mg/dL — ABNORMAL LOW (ref 8.9–10.3)
Chloride: 100 mmol/L (ref 98–111)
Creatinine, Ser: 0.74 mg/dL (ref 0.61–1.24)
GFR calc non Af Amer: >60 mL/min (ref >60)
Glucose, Bld: 113 mg/dL — ABNORMAL HIGH (ref 70–99)
Potassium: 3.7 mmol/L (ref 3.5–5.1)
Sodium: 135 mmol/L (ref 135–145)

## 2018-06-26 LAB — CBC
HCT: 27 % — ABNORMAL LOW (ref 39.0–52.0)
HEMOGLOBIN: 8.5 g/dL — AB (ref 13.0–17.0)
MCH: 28.7 pg (ref 26.0–34.0)
MCHC: 31.5 g/dL (ref 30.0–36.0)
MCV: 91.2 fL (ref 80.0–100.0)
Platelets: 267 10*3/uL (ref 150–400)
RBC: 2.96 MIL/uL — ABNORMAL LOW (ref 4.22–5.81)
RDW: 13.7 % (ref 11.5–15.5)
WBC: 7 10*3/uL (ref 4.0–10.5)
nRBC: 0 % (ref 0.0–0.2)

## 2018-06-26 LAB — GLUCOSE, CAPILLARY
Glucose-Capillary: 106 mg/dL — ABNORMAL HIGH (ref 70–99)
Glucose-Capillary: 109 mg/dL — ABNORMAL HIGH (ref 70–99)
Glucose-Capillary: 114 mg/dL — ABNORMAL HIGH (ref 70–99)
Glucose-Capillary: 115 mg/dL — ABNORMAL HIGH (ref 70–99)
Glucose-Capillary: 138 mg/dL — ABNORMAL HIGH (ref 70–99)
Glucose-Capillary: 98 mg/dL (ref 70–99)

## 2018-06-26 NOTE — Progress Notes (Signed)
Nutrition Follow-up  DOCUMENTATION CODES:   Severe malnutrition in context of acute illness/injury  INTERVENTION:   - d/c Boost Breeze per pt preference  Continue TF via J-tube: - Vital 1.5@ 26m/hr via J-tube (1200 ml/day) - 368mProstat daily - Continue 100 ml free water flush q 8 hours  Tube feeding regimen provides1950kcal (100% of needs),96grams of protein, and 121774mf H2O. Also provides 40 grams of fat daily.  NUTRITION DIAGNOSIS:   Severe Malnutrition related to acute illness (s/p esophagectomy d/t esophageal cancer) as evidenced by moderate fat depletion, severe fat depletion, moderate muscle depletion, mild muscle depletion.  Ongoing  GOAL:   Patient will meet greater than or equal to 90% of their needs  Met via TF  MONITOR:   PO intake, Labs, Weight trends, TF tolerance, I & O's  REASON FOR ASSESSMENT:   Consult Enteral/tube feeding initiation and management  ASSESSMENT:   [age]ar old male w/ PMH of HTN, GERD, and esophageal cancer. On 2/17 he underwent a thoracotomy, pyloroplasty, complete esophagectomy, and had a feeding jejunostomy placed.   2/22 - intubated d/t respiratory insufficiency, bronchoscopy performed and revealed pt aspirated on gastric contents, green fluid was found in trachea running into each mainstem bronchus bilaterally 2/23 - extubated then re-intubated d/t inability to maintain oxygenation 2/23 - CPR started after suffering asystolic arrest after laying flat for intubation 2/25 - extubated 3/6 - s/p left thoracentesis (1.5 L chylous yellow fluid removed) 3/9 - s/p insertion pleural drainage catheter 3/14 - s/p fenestration of lymphatic ducts, lymphangiogram  Weight fairly stable since admit, up 2 lbs since first measured weight on 05/31/18.  Given chylothorax and need for low fat formula, will continue with Vital 1.5 which is a hydrolyzed peptide-based protein system, MCT/canola oil structured lipid, scFOD to promote  tolerance.  TF of Vital 1.5@ 48m3m infusing via J-tube. Pt also receiving 30 ml Prostat daily and 100 ml free water flush every 8 hours. Tube feeding regimen provides1950kcal (100% of needs),96grams of protein, and 1217ml49mH2O. Also provides 40 grams of fat daily.  Spoke with pt and RN at bedside. Pt states that he has had a lot of visitors today and that he has not even had a chance to go to the bathroom yet. Pt reports that he does not like the [facility]. [facility] to d/c order. Pt reports that he ate "great!" at lunch today and completed >50% of the mashed potatoes, >50% of the [place] some of the pudding. RN states that she did not see his finished lunch tray before it was removed from pt's room. Pt states that he doesn't eat breakfast because it comes up cold.  Pt and RN confirm pt is tolerating TF well with no issues.  Meal Completion: 0-10%  Medications reviewed and include: Boost Breeze TID, Protonix  Labs reviewed: hemoglobin 8.5 (L) CBG's: 106, 138, 114, 120 x 24 hours  UOP: 1175 ml x 24 hours CT output: 675 ml x 24 hours I/O's: +20.2 L since admit  Diet Order:   Diet Order            DIET SOFT Room service appropriate? Yes; Fluid consistency: Thin  Diet effective now              EDUCATION NEEDS:   Education needs have been addressed  Skin:  Skin Assessment: Skin Integrity Issues: Incisions: abdomen, neck Other: left pleurex drain  Last BM:  06/25/18 large type 6  Height:  Ht Readings from Last 1 Encounters:  05/29/18 '5\' 7"'  (1.702 m)    Weight:   Wt Readings from Last 1 Encounters:  06/26/18 60.8 kg    Ideal Body Weight:  67.27 kg  BMI:  Body mass index is 20.99 kg/m.  Estimated Nutritional Needs:   Kcal:  1850-2050  Protein:  95-110 grams  Fluid:  > 1.8 L    Gaynell Face, MS, RD, LDN Inpatient Clinical Dietitian Pager: [phone] Weekend/After Hours: [phone]

## 2018-06-26 NOTE — Progress Notes (Signed)
Physical Therapy Treatment Patient Details Name: Aaron Wang MRN: [id number] DOB: 1955-08-10 Today's Date: 06/26/2018    History of Present Illness Pt is a 63 y.o. male admitted 05/29/18 with h/o esophageal cancer and increased difficulty swallowing. S/p transhiatal total esophagectomy, pyloromyotomy, J-tube insertion; extubated same day. Reintubated 2/22-2/23; and again 2/23-2/25, also with asystolic arrest requiring CPR 2/23. Pt with pleural effusion s/p thoracentesis 2/28 and 3/6.    PT Comments    Pt progressing well with mobility. He demonstrates independence with bed mobility and transfers. Supervision provided for ambulation 500 feet without AD. Pt on 2 L O2 throughout session. PT to continue per POC.   Follow Up Recommendations  Home health PT;Supervision - Intermittent     Equipment Recommendations  None recommended by PT    Recommendations for Other Services       Precautions / Restrictions Precautions Precautions: Fall;Other (comment) Precaution Comments: J tube    Mobility  Bed Mobility Overal bed mobility: Independent                Transfers Overall transfer level: Modified independent Equipment used: None Transfers: Sit to/from [facility] Sit to Stand: Modified independent (Device/Increase time) Stand pivot transfers: Modified independent (Device/Increase time)       General transfer comment: Pt demo safe technique.  Ambulation/Gait Ambulation/Gait assistance: Supervision Gait Distance (Feet): 500 Feet Assistive device: None Gait Pattern/deviations: Step-through pattern;Decreased stride length Gait velocity: Decreased Gait velocity interpretation: <1.31 ft/sec, indicative of household ambulator General Gait Details: Slight shuffle. Supervision for safety. Pt able to manage his portable O2.    Stairs             Wheelchair Mobility    Modified Rankin (Stroke Patients Only)       Balance   Sitting-balance  support: Feet supported Sitting balance-Leahy Scale: Good     Standing balance support: No upper extremity supported;During functional activity Standing balance-Leahy Scale: Good                              Cognition Arousal/Alertness: Awake/alert Behavior During Therapy: WFL for tasks assessed/performed Overall Cognitive Status: Within Functional Limits for tasks assessed                                        Exercises      General Comments General comments (skin integrity, edema, etc.): VSS      Pertinent Vitals/Pain Pain Assessment: No/denies pain    Home Living                      Prior Function            PT Goals (current goals can now be found in the care plan section) Acute Rehab PT Goals Patient Stated Goal: home PT Goal Formulation: With patient Time For Goal Achievement: 07/01/18 Potential to Achieve Goals: Good Progress towards PT goals: Progressing toward goals    Frequency    Min 3X/week      PT Plan Current plan remains appropriate    Co-evaluation              AM-PAC PT "6 Clicks" Mobility   Outcome Measure  Help needed turning from your back to your side while in a flat bed without using bedrails?: None Help needed moving from lying on your back  to sitting on the side of a flat bed without using bedrails?: None Help needed moving to and from a bed to a chair (including a wheelchair)?: None Help needed standing up from a chair using your arms (e.g., wheelchair or bedside chair)?: None Help needed to walk in hospital room?: None Help needed climbing 3-5 steps with a railing? : A Little 6 Click Score: 23    End of Session Equipment Utilized During Treatment: Oxygen Activity Tolerance: Patient tolerated treatment well Patient left: in chair;with call bell/phone within reach Nurse Communication: Mobility status PT Visit Diagnosis: Other abnormalities of gait and mobility (R26.89)     Time:  5638-9373 PT Time Calculation (min) (ACUTE ONLY): 13 min  Charges:  $Gait Training: 8-22 mins                     Lorrin Goodell, PT  Office # [phone] Pager [phone]    Lorriane Shire 06/26/2018, 1:56 PM

## 2018-06-26 NOTE — Plan of Care (Signed)
  Problem: Education: Goal: Knowledge of the prescribed therapeutic regimen will improve Outcome: Progressing   Problem: Bowel/Gastric: Goal: Gastrointestinal status for postoperative course will improve Outcome: Progressing   Problem: Nutritional: Goal: Ability to achieve adequate nutritional intake will improve Outcome: Progressing   Problem: Clinical Measurements: Goal: Postoperative complications will be avoided or minimized Outcome: Progressing   Problem: Respiratory: Goal: Ability to maintain a clear airway will improve Outcome: Progressing   Problem: Skin Integrity: Goal: Demonstration of wound healing without infection will improve Outcome: Progressing   Problem: Education: Goal: Knowledge of General Education information will improve Description Including pain rating scale, medication(s)/side effects and non-pharmacologic comfort measures Outcome: Progressing   Problem: Health Behavior/Discharge Planning: Goal: Ability to manage health-related needs will improve Outcome: Progressing   Problem: Clinical Measurements: Goal: Ability to maintain clinical measurements within normal limits will improve Outcome: Progressing Goal: Will remain free from infection Outcome: Progressing Goal: Diagnostic test results will improve Outcome: Progressing Goal: Respiratory complications will improve Outcome: Progressing Goal: Cardiovascular complication will be avoided Outcome: Progressing   Problem: Activity: Goal: Risk for activity intolerance will decrease Outcome: Progressing   Problem: Nutrition: Goal: Adequate nutrition will be maintained Outcome: Progressing   Problem: Coping: Goal: Level of anxiety will decrease Outcome: Progressing   Problem: Elimination: Goal: Will not experience complications related to bowel motility Outcome: Progressing   Problem: Pain Managment: Goal: General experience of comfort will improve Outcome: Progressing   Problem:  Safety: Goal: Ability to remain free from injury will improve Outcome: Progressing   Problem: Skin Integrity: Goal: Risk for impaired skin integrity will decrease Outcome: Progressing

## 2018-06-26 NOTE — Progress Notes (Signed)
CSW provided patient with medicare.gov listing for home health choice. CSW will follow up later today to get home health agency choice. Copy of listing placed in chart.   Thurmond Butts, MSW, [place] Social Worker [phone]

## 2018-06-26 NOTE — Progress Notes (Addendum)
[address]       [place] [address]             [phone]      2 Days Post-Op Procedure(s) (LRB): T duct embolization (N/A) Subjective: Feels pretty well  Objective: Vital signs in last 24 hours: Temp:  [97.1 F (36.2 C)-98.2 F (36.8 C)] 98.1 F (36.7 C) (03/16 0300) Pulse Rate:  [82-90] 83 (03/16 0300) Cardiac Rhythm: Normal sinus rhythm (03/16 0700) Resp:  [14-21] 21 (03/16 0300) BP: (133-150)/(77-91) 146/88 (03/16 0300) SpO2:  [96 %-100 %] 100 % (03/16 0300) Weight:  [60.8 kg] 60.8 kg (03/16 0300)  Hemodynamic parameters for last 24 hours:    Intake/Output from previous day: 03/15 0701 - 03/16 0700 In: 1920 [P.O.:420; NG/GT:1500] Out: 1850 [Urine:1175; Chest Tube:675] Intake/Output this shift: No intake/output data recorded.  General appearance: alert, cooperative and no distress Heart: regular rate and rhythm Lungs: mildly dim right base Abdomen: benign Extremities: no edema Wound: incis well healed  Lab Results: Recent Labs    06/24/18 0706 06/26/18 0500  WBC 5.9 7.0  HGB 8.7* 8.5*  HCT 26.4* 27.0*  PLT 266 267   BMET:  Recent Labs    06/24/18 0500 06/26/18 0500  NA 136 135  K 3.7 3.7  CL 100 100  CO2 29 29  GLUCOSE 100* 113*  BUN 9 9  CREATININE 0.93 0.74  CALCIUM 8.3* 8.1*    PT/INR: No results for input(s): LABPROT, INR in the last 72 hours. ABG    Component Value Date/Time   PHART 7.479 (H) 06/07/2018 0733   HCO3 24.8 06/07/2018 0733   TCO2 26 06/07/2018 0733   ACIDBASEDEF 3.0 (H) 05/30/2018 0400   O2SAT 66.6 06/10/2018 0518   CBG (last 3)  Recent Labs    06/25/18 2141 06/26/18 0011 06/26/18 0422  GLUCAP 114* 138* 106*    Meds Scheduled Meds:  amLODipine  10 mg Oral Daily   carvedilol  12.5 mg Oral BID WC   chlorhexidine  15 mL Mouth/Throat BID   Chlorhexidine Gluconate Cloth  6 each Topical Daily   cloNIDine  0.2 mg Transdermal Weekly   feeding supplement  1 Container Oral TID BM     feeding supplement (PRO-STAT SUGAR FREE 64)  30 mL Per Tube Daily   free water  100 mL Per Tube Q8H   guaiFENesin  600 mg Oral BID   lisinopril  20 mg Oral Daily   pantoprazole  40 mg Oral BID   sodium chloride flush  10-40 mL Intracatheter Q12H   Continuous Infusions:  feeding supplement (VITAL 1.5 CAL) 1,000 mL (06/26/18 0453)   lactated ringers Stopped (06/22/18 1349)   PRN Meds:.oxyCODONE **AND** acetaminophen, diphenoxylate-atropine, labetalol, lidocaine, ondansetron (ZOFRAN) IV, potassium chloride, sodium chloride flush  Xrays Ir Veno/ext/uni Left  Result Date: 06/24/2018 INDICATION: 63 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 EXAM: 1. Ultrasound-guided needle placement right inguinal lymph node 2. Ultrasound-guided needle placement left inguinal lymph node 3. Ultrasound-guided vascular access left brachial vein 4. Bilateral abdomen and pelvis lymphangiogram 5. Catheterization of the left subclavian vein 6. Left subclavian venogram COMPARISON:  None. MEDICATIONS: None. ANESTHESIA/SEDATION: General anesthesia FLUOROSCOPY TIME:  Fluoroscopy Time: 52 minutes 48 seconds (1166 mGy). COMPLICATIONS: SIR Level A - No therapy, no consequence. TECHNIQUE: Informed written consent was obtained from the patient after a thorough discussion of the procedural risks, benefits and alternatives. All questions were addressed. Maximal Sterile Barrier Technique was utilized including  caps, mask, sterile gowns, sterile gloves, sterile drape, hand hygiene and skin antiseptic. A timeout was performed prior to the initiation of the procedure. Ultrasound was used to interrogate the right inguinal region. Multiple small lymph nodes were identified. A suitable lymph node with a visible hilum was identified. Under real-time sonographic guidance, a 25 gauge spinal needle was carefully advanced into the hilum of the lymph node. Ultrasound was then used to interrogate the left  inguinal region. Again, multiple small lymph nodes were identified. Suitable lymph node with a visible hilum was identified. Under real-time sonographic guidance, a 25 gauge spinal needle was carefully advanced into the hilum of the lymph node. Lipiodol was then slowly and gently injected through both 25 gauge spinal needles using a combination of a 12 mL syringe and IV connecting tubing. A total of 10-15 mL Lipiodol was administered between the 2 sides. While the Lipiodol was slowly Pr cleaning up the lymphatic system, attention was turned to the left upper extremity. The left brachial vein was interrogated with ultrasound and found to be widely patent. An image was obtained and stored for the medical record. Local anesthesia was attained by infiltration with 1% lidocaine. A small dermatotomy was made. Under real-time sonographic guidance, the vessel was punctured with a 21 gauge micropuncture needle. Using standard technique, the initial micro needle was exchanged over a 0.018 micro wire for a transitional 4 [place] micro sheath. The micro sheath was then exchanged over a 0.035 wire for a 5 French vascular sheath. A C2 cobra catheter was then advanced over a Bentson wire into the left subclavian vein. Several minutes were spent attempting to cannulate the thoracic duct from the venous side. This was ultimately unsuccessful. Multiple radiographs were obtained. The pelvic nodal chain is unremarkable. In the abdomen, in the typical location of the cisterna chyli there are instead several lymph nodes with numerous small connecting channels. Several of the channels demonstrated cephalad movement of small bits of Lipiodol. Therefore, attempts were made to catheterize these relatively small channels using a combination of 22 gauge Chiba and 21 gauge trocar needles. On at least 2 occasions, we successfully punctured the very small 1 mm ducts and advanced a 014 Transcend wire several cm into the ducts. Unfortunately, the  wire would not pass beyond the lymph node in the mid epigastric region. Attempts at navigate in a microcatheter into the ductal system or unsuccessful secondary to lack of adequate wire purchase. At 1 point in time, a small portion of the tip of a Transcend wire was sheared off and is located within the right retroperitoneal space in a nonvascular structure. Additional imaging of the chest demonstrates successful visualization of the thoracic duct in the mid and upper chest. Unfortunately, the more distal thoracic duct is not visible and the thoracic duct where it enters the subclavian vein is not visible. The visualized portions of the thoracic duct are not accessible percutaneously. FINDINGS: Successful lymphangiogram and partial fenestration of small lymphatic ducts in the mid epigastric region. Unfortunately, the thoracic duct is not accessible percutaneously. Unsuccessful attempted catheterization of the thoracic duct from the subclavian vein. IMPRESSION: Successful lymphangiogram with fenestration of small lymphatic ducts in the mid epigastric region. Unsuccessful thoracic duct embolization secondary to patient anatomy. Signed, Criselda Peaches, MD, RPVI and Linus Mako. Pascal Lux, MD [facility] Radiology Electronically Signed   By: Jacqulynn Cadet M.D.   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 EXAM: 1. Ultrasound-guided needle placement right inguinal lymph node 2. Ultrasound-guided needle placement left inguinal lymph node 3. Ultrasound-guided vascular access left brachial vein 4. Bilateral abdomen and pelvis lymphangiogram 5. Catheterization of the left subclavian vein 6. Left subclavian venogram COMPARISON:  None. MEDICATIONS: None. ANESTHESIA/SEDATION: General anesthesia FLUOROSCOPY TIME:  Fluoroscopy Time: 52 minutes 48 seconds  (1166 mGy). COMPLICATIONS: SIR Level A - No therapy, no consequence. TECHNIQUE: Informed written consent was obtained from the patient after a thorough discussion of the procedural risks, benefits and alternatives. All questions were addressed. Maximal Sterile Barrier Technique was utilized including caps, mask, sterile gowns, sterile gloves, sterile drape, hand hygiene and skin antiseptic. A timeout was performed prior to the initiation of the procedure. Ultrasound was used to interrogate the right inguinal region. Multiple small lymph nodes were identified. A suitable lymph node with a visible hilum was identified. Under real-time sonographic guidance, a 25 gauge spinal needle was carefully advanced into the hilum of the lymph node. Ultrasound was then used to interrogate the left inguinal region. Again, multiple small lymph nodes were identified. Suitable lymph node with a visible hilum was identified. Under real-time sonographic guidance, a 25 gauge spinal needle was carefully advanced into the hilum of the lymph node. Lipiodol was then slowly and gently injected through both 25 gauge spinal needles using a combination of a 12 mL syringe and IV connecting tubing. A total of 10-15 mL Lipiodol was administered between the 2 sides. While the Lipiodol was slowly Pr cleaning up the lymphatic system, attention was turned to the left upper extremity. The left brachial vein was interrogated with ultrasound and found to be widely patent. An image was obtained and stored for the medical record. Local anesthesia was attained by infiltration with 1% lidocaine. A small dermatotomy was made. Under real-time sonographic guidance, the vessel was punctured with a 21 gauge micropuncture needle. Using standard technique, the initial micro needle was exchanged over a 0.018 micro wire for a transitional 4 [place] micro sheath. The micro sheath was then exchanged over a 0.035 wire for a 5 French vascular sheath. A C2 cobra catheter  was then advanced over a Bentson wire into the left subclavian vein. Several minutes were spent attempting to cannulate the thoracic duct from the venous side. This was ultimately unsuccessful. Multiple radiographs were obtained. The pelvic nodal chain is unremarkable. In the abdomen, in the typical location of the cisterna chyli there are instead several lymph nodes with numerous small connecting channels. Several of the channels demonstrated cephalad movement of small bits of Lipiodol. Therefore, attempts were made to catheterize these relatively small channels using a combination of 22 gauge Chiba and 21 gauge trocar needles. On at least 2 occasions, we successfully punctured the very small 1 mm ducts and advanced a 014 Transcend wire several cm into the ducts. Unfortunately, the wire would not pass beyond the lymph node in the mid epigastric region. Attempts at navigate in a microcatheter into the ductal system or unsuccessful secondary to lack of adequate wire purchase. At 1 point in time, a small portion of the tip of a Transcend wire was sheared off and is located within the right retroperitoneal space in a nonvascular structure. Additional imaging of the chest demonstrates successful visualization of the thoracic duct in the mid and upper chest. Unfortunately, the more distal thoracic duct is not visible and the thoracic duct where it enters the subclavian vein is not visible.  The visualized portions of the thoracic duct are not accessible percutaneously. FINDINGS: Successful lymphangiogram and partial fenestration of small lymphatic ducts in the mid epigastric region. Unfortunately, the thoracic duct is not accessible percutaneously. Unsuccessful attempted catheterization of the thoracic duct from the subclavian vein. IMPRESSION: Successful lymphangiogram with fenestration of small lymphatic ducts in the mid epigastric region. Unsuccessful thoracic duct embolization secondary to patient anatomy. Signed,  Criselda Peaches, MD, RPVI and Linus Mako. Pascal Lux, MD [facility] Radiology Electronically Signed   By: Jacqulynn Cadet M.D.   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 EXAM: 1. Ultrasound-guided needle placement right inguinal lymph node 2. Ultrasound-guided needle placement left inguinal lymph node 3. Ultrasound-guided vascular access left brachial vein 4. Bilateral abdomen and pelvis lymphangiogram 5. Catheterization of the left subclavian vein 6. Left subclavian venogram COMPARISON:  None. MEDICATIONS: None. ANESTHESIA/SEDATION: General anesthesia FLUOROSCOPY TIME:  Fluoroscopy Time: 52 minutes 48 seconds (1166 mGy). COMPLICATIONS: SIR Level A - No therapy, no consequence. TECHNIQUE: Informed written consent was obtained from the patient after a thorough discussion of the procedural risks, benefits and alternatives. All questions were addressed. Maximal Sterile Barrier Technique was utilized including caps, mask, sterile gowns, sterile gloves, sterile drape, hand hygiene and skin antiseptic. A timeout was performed prior to the initiation of the procedure. Ultrasound was used to interrogate the right inguinal region. Multiple small lymph nodes were identified. A suitable lymph node with a visible hilum was identified. Under real-time sonographic guidance, a 25 gauge spinal needle was carefully advanced into the hilum of the lymph node. Ultrasound was then used to interrogate the left inguinal region. Again, multiple small lymph nodes were identified. Suitable lymph node with a visible hilum was identified. Under real-time sonographic guidance, a 25 gauge spinal needle was carefully advanced into the hilum of the lymph node. Lipiodol was then slowly and gently injected through both 25 gauge spinal needles using a combination of a 12 mL  syringe and IV connecting tubing. A total of 10-15 mL Lipiodol was administered between the 2 sides. While the Lipiodol was slowly Pr cleaning up the lymphatic system, attention was turned to the left upper extremity. The left brachial vein was interrogated with ultrasound and found to be widely patent. An image was obtained and stored for the medical record. Local anesthesia was attained by infiltration with 1% lidocaine. A small dermatotomy was made. Under real-time sonographic guidance, the vessel was punctured with a 21 gauge micropuncture needle. Using standard technique, the initial micro needle was exchanged over a 0.018 micro wire for a transitional 4 [place] micro sheath. The micro sheath was then exchanged over a 0.035 wire for a 5 French vascular sheath. A C2 cobra catheter was then advanced over a Bentson wire into the left subclavian vein. Several minutes were spent attempting to cannulate the thoracic duct from the venous side. This was ultimately unsuccessful. Multiple radiographs were obtained. The pelvic nodal chain is unremarkable. In the abdomen, in the typical location of the cisterna chyli there are instead several lymph nodes with numerous small connecting channels. Several of the channels demonstrated cephalad movement of small bits of Lipiodol. Therefore, attempts were made to catheterize these relatively small channels using a combination of 22 gauge Chiba and 21 gauge trocar needles. On at least 2 occasions, we successfully punctured the very small 1 mm ducts and advanced a 014  Transcend wire several cm into the ducts. Unfortunately, the wire would not pass beyond the lymph node in the mid epigastric region. Attempts at navigate in a microcatheter into the ductal system or unsuccessful secondary to lack of adequate wire purchase. At 1 point in time, a small portion of the tip of a Transcend wire was sheared off and is located within the right retroperitoneal space in a nonvascular structure.  Additional imaging of the chest demonstrates successful visualization of the thoracic duct in the mid and upper chest. Unfortunately, the more distal thoracic duct is not visible and the thoracic duct where it enters the subclavian vein is not visible. The visualized portions of the thoracic duct are not accessible percutaneously. FINDINGS: Successful lymphangiogram and partial fenestration of small lymphatic ducts in the mid epigastric region. Unfortunately, the thoracic duct is not accessible percutaneously. Unsuccessful attempted catheterization of the thoracic duct from the subclavian vein. IMPRESSION: Successful lymphangiogram with fenestration of small lymphatic ducts in the mid epigastric region. Unsuccessful thoracic duct embolization secondary to patient anatomy. Signed, Criselda Peaches, MD, RPVI and Linus Mako. Pascal Lux, MD [facility] Radiology Electronically Signed   By: Jacqulynn Cadet M.D.   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 EXAM: 1. Ultrasound-guided needle placement right inguinal lymph node 2. Ultrasound-guided needle placement left inguinal lymph node 3. Ultrasound-guided vascular access left brachial vein 4. Bilateral abdomen and pelvis lymphangiogram 5. Catheterization of the left subclavian vein 6. Left subclavian venogram COMPARISON:  None. MEDICATIONS: None. ANESTHESIA/SEDATION: General anesthesia FLUOROSCOPY TIME:  Fluoroscopy Time: 52 minutes 48 seconds (1166 mGy). COMPLICATIONS: SIR Level A - No therapy, no consequence. TECHNIQUE: Informed written consent was obtained from the patient after a thorough discussion of the procedural risks, benefits and alternatives. All questions were addressed. Maximal Sterile Barrier Technique was utilized including caps, mask, sterile gowns, sterile gloves, sterile drape,  hand hygiene and skin antiseptic. A timeout was performed prior to the initiation of the procedure. Ultrasound was used to interrogate the right inguinal region. Multiple small lymph nodes were identified. A suitable lymph node with a visible hilum was identified. Under real-time sonographic guidance, a 25 gauge spinal needle was carefully advanced into the hilum of the lymph node. Ultrasound was then used to interrogate the left inguinal region. Again, multiple small lymph nodes were identified. Suitable lymph node with a visible hilum was identified. Under real-time sonographic guidance, a 25 gauge spinal needle was carefully advanced into the hilum of the lymph node. Lipiodol was then slowly and gently injected through both 25 gauge spinal needles using a combination of a 12 mL syringe and IV connecting tubing. A total of 10-15 mL Lipiodol was administered between the 2 sides. While the Lipiodol was slowly Pr cleaning up the lymphatic system, attention was turned to the left upper extremity. The left brachial vein was interrogated with ultrasound and found to be widely patent. An image was obtained and stored for the medical record. Local anesthesia was attained by infiltration with 1% lidocaine. A small dermatotomy was made. Under real-time sonographic guidance, the vessel was punctured with a 21 gauge micropuncture needle. Using standard technique, the initial micro needle was exchanged over a 0.018 micro wire for a transitional 4 [place] micro sheath. The micro sheath was then exchanged over a 0.035 wire for a 5 French vascular sheath. A C2 cobra catheter was then advanced  over a Bentson wire into the left subclavian vein. Several minutes were spent attempting to cannulate the thoracic duct from the venous side. This was ultimately unsuccessful. Multiple radiographs were obtained. The pelvic nodal chain is unremarkable. In the abdomen, in the typical location of the cisterna chyli there are instead several  lymph nodes with numerous small connecting channels. Several of the channels demonstrated cephalad movement of small bits of Lipiodol. Therefore, attempts were made to catheterize these relatively small channels using a combination of 22 gauge Chiba and 21 gauge trocar needles. On at least 2 occasions, we successfully punctured the very small 1 mm ducts and advanced a 014 Transcend wire several cm into the ducts. Unfortunately, the wire would not pass beyond the lymph node in the mid epigastric region. Attempts at navigate in a microcatheter into the ductal system or unsuccessful secondary to lack of adequate wire purchase. At 1 point in time, a small portion of the tip of a Transcend wire was sheared off and is located within the right retroperitoneal space in a nonvascular structure. Additional imaging of the chest demonstrates successful visualization of the thoracic duct in the mid and upper chest. Unfortunately, the more distal thoracic duct is not visible and the thoracic duct where it enters the subclavian vein is not visible. The visualized portions of the thoracic duct are not accessible percutaneously. FINDINGS: Successful lymphangiogram and partial fenestration of small lymphatic ducts in the mid epigastric region. Unfortunately, the thoracic duct is not accessible percutaneously. Unsuccessful attempted catheterization of the thoracic duct from the subclavian vein. IMPRESSION: Successful lymphangiogram with fenestration of small lymphatic ducts in the mid epigastric region. Unsuccessful thoracic duct embolization secondary to patient anatomy. Signed, Criselda Peaches, MD, RPVI and Linus Mako. Pascal Lux, MD [facility] Radiology Electronically Signed   By: Jacqulynn Cadet M.D.   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 EXAM: 1.  Ultrasound-guided needle placement right inguinal lymph node 2. Ultrasound-guided needle placement left inguinal lymph node 3. Ultrasound-guided vascular access left brachial vein 4. Bilateral abdomen and pelvis lymphangiogram 5. Catheterization of the left subclavian vein 6. Left subclavian venogram COMPARISON:  None. MEDICATIONS: None. ANESTHESIA/SEDATION: General anesthesia FLUOROSCOPY TIME:  Fluoroscopy Time: 52 minutes 48 seconds (1166 mGy). COMPLICATIONS: SIR Level A - No therapy, no consequence. TECHNIQUE: Informed written consent was obtained from the patient after a thorough discussion of the procedural risks, benefits and alternatives. All questions were addressed. Maximal Sterile Barrier Technique was utilized including caps, mask, sterile gowns, sterile gloves, sterile drape, hand hygiene and skin antiseptic. A timeout was performed prior to the initiation of the procedure. Ultrasound was used to interrogate the right inguinal region. Multiple small lymph nodes were identified. A suitable lymph node with a visible hilum was identified. Under real-time sonographic guidance, a 25 gauge spinal needle was carefully advanced into the hilum of the lymph node. Ultrasound was then used to interrogate the left inguinal region. Again, multiple small lymph nodes were identified. Suitable lymph node with a visible hilum was identified. Under real-time sonographic guidance, a 25 gauge spinal needle was carefully advanced into the hilum of the lymph node. Lipiodol was then slowly and gently injected through both 25 gauge spinal needles using a combination of a 12 mL syringe and IV connecting tubing. A total of 10-15 mL Lipiodol was administered between the 2 sides. While the Lipiodol  was slowly Pr cleaning up the lymphatic system, attention was turned to the left upper extremity. The left brachial vein was interrogated with ultrasound and found to be widely patent. An image was obtained and stored for the medical  record. Local anesthesia was attained by infiltration with 1% lidocaine. A small dermatotomy was made. Under real-time sonographic guidance, the vessel was punctured with a 21 gauge micropuncture needle. Using standard technique, the initial micro needle was exchanged over a 0.018 micro wire for a transitional 4 [place] micro sheath. The micro sheath was then exchanged over a 0.035 wire for a 5 French vascular sheath. A C2 cobra catheter was then advanced over a Bentson wire into the left subclavian vein. Several minutes were spent attempting to cannulate the thoracic duct from the venous side. This was ultimately unsuccessful. Multiple radiographs were obtained. The pelvic nodal chain is unremarkable. In the abdomen, in the typical location of the cisterna chyli there are instead several lymph nodes with numerous small connecting channels. Several of the channels demonstrated cephalad movement of small bits of Lipiodol. Therefore, attempts were made to catheterize these relatively small channels using a combination of 22 gauge Chiba and 21 gauge trocar needles. On at least 2 occasions, we successfully punctured the very small 1 mm ducts and advanced a 014 Transcend wire several cm into the ducts. Unfortunately, the wire would not pass beyond the lymph node in the mid epigastric region. Attempts at navigate in a microcatheter into the ductal system or unsuccessful secondary to lack of adequate wire purchase. At 1 point in time, a small portion of the tip of a Transcend wire was sheared off and is located within the right retroperitoneal space in a nonvascular structure. Additional imaging of the chest demonstrates successful visualization of the thoracic duct in the mid and upper chest. Unfortunately, the more distal thoracic duct is not visible and the thoracic duct where it enters the subclavian vein is not visible. The visualized portions of the thoracic duct are not accessible percutaneously. FINDINGS: Successful  lymphangiogram and partial fenestration of small lymphatic ducts in the mid epigastric region. Unfortunately, the thoracic duct is not accessible percutaneously. Unsuccessful attempted catheterization of the thoracic duct from the subclavian vein. IMPRESSION: Successful lymphangiogram with fenestration of small lymphatic ducts in the mid epigastric region. Unsuccessful thoracic duct embolization secondary to patient anatomy. Signed, Criselda Peaches, MD, RPVI and Linus Mako. Pascal Lux, MD [facility] Radiology Electronically Signed   By: Jacqulynn Cadet M.D.   On: 06/24/2018 13:25   Dg Chest Port 1 View  Result Date: 06/25/2018 CLINICAL DATA:  Follow-up pleural effusion. EXAM: PORTABLE CHEST 1 VIEW COMPARISON:  06/24/2018 FINDINGS: Left chest tube remains in place. Right arm PICC tip in the SVC above the right atrium. There is less pleural fluid on the left with better aeration of the left lower lung. Persistent right effusion with right lower lung volume loss. Lymphangiogram contrast is present more notably in the left mediastinal and hilar region and in the left supraclavicular region. IMPRESSION: Less pleural fluid on the left with better aeration of the left lower lung. Similar appearance of pleural fluid on the right with right lower lung volume loss. No pleural air identified. Electronically Signed   By: Nelson Chimes M.D.   On: 06/25/2018 07:15    Assessment/Plan: S/P Procedure(s) (LRB): T duct embolization (N/A)  1 steady progress 2 BP pretty well controlled for him on current meds 3 conts pleurx drainage, 675 cc  yesterday 4 BS well controlled 5 renal fxn is in normal range 6 right pleural effus stable or slightly increased with some ASD- monitor closely 7 no leukocytosis or fevers 8 anemia slightly improved 9 conts current TF's/diet  LOS: 28 days    [name] PA-C 06/26/2018 Pager [phone]  Continue aggressive diet  control Patient has preop severe protein malnutrition- continues on diet and tube feeding Says he is returning to hotel room on d/c ,  I have seen and examined Perlie Mayo and agree with the above assessment  and plan.  [name] MD Beeper [phone] Office [phone] 06/26/2018 11:02 AM

## 2018-06-27 LAB — GLUCOSE, CAPILLARY
Glucose-Capillary: 104 mg/dL — ABNORMAL HIGH (ref 70–99)
Glucose-Capillary: 109 mg/dL — ABNORMAL HIGH (ref 70–99)
Glucose-Capillary: 110 mg/dL — ABNORMAL HIGH (ref 70–99)
Glucose-Capillary: 112 mg/dL — ABNORMAL HIGH (ref 70–99)
Glucose-Capillary: 90 mg/dL (ref 70–99)
Glucose-Capillary: 93 mg/dL (ref 70–99)

## 2018-06-27 NOTE — Plan of Care (Signed)
  Problem: Education: Goal: Knowledge of the prescribed therapeutic regimen will improve Outcome: Progressing   Problem: Bowel/Gastric: Goal: Gastrointestinal status for postoperative course will improve Outcome: Progressing   Problem: Nutritional: Goal: Ability to achieve adequate nutritional intake will improve Outcome: Progressing   Problem: Clinical Measurements: Goal: Postoperative complications will be avoided or minimized Outcome: Progressing   Problem: Respiratory: Goal: Ability to maintain a clear airway will improve Outcome: Progressing   Problem: Skin Integrity: Goal: Demonstration of wound healing without infection will improve Outcome: Progressing   Problem: Education: Goal: Knowledge of General Education information will improve Description Including pain rating scale, medication(s)/side effects and non-pharmacologic comfort measures Outcome: Progressing   Problem: Health Behavior/Discharge Planning: Goal: Ability to manage health-related needs will improve Outcome: Progressing   Problem: Clinical Measurements: Goal: Ability to maintain clinical measurements within normal limits will improve Outcome: Progressing Goal: Will remain free from infection Outcome: Progressing Goal: Diagnostic test results will improve Outcome: Progressing Goal: Respiratory complications will improve Outcome: Progressing Goal: Cardiovascular complication will be avoided Outcome: Progressing   Problem: Activity: Goal: Risk for activity intolerance will decrease Outcome: Progressing   Problem: Nutrition: Goal: Adequate nutrition will be maintained Outcome: Progressing   Problem: Coping: Goal: Level of anxiety will decrease Outcome: Progressing   Problem: Elimination: Goal: Will not experience complications related to bowel motility Outcome: Progressing   Problem: Pain Managment: Goal: General experience of comfort will improve Outcome: Progressing   Problem:  Safety: Goal: Ability to remain free from injury will improve Outcome: Progressing   Problem: Skin Integrity: Goal: Risk for impaired skin integrity will decrease Outcome: Progressing

## 2018-06-27 NOTE — Progress Notes (Addendum)
      [address]       [place] [address]             [phone]       3 Days Post-Op Procedure(s) (LRB): T duct embolization (N/A)  23  Days Post-Op Procedure(s) (LRB): TRANSHIATAL TOTAL ESOPHAGECTOMY (N/A) PYLOROMYOTOMY (N/A) FEEDING JEJUNOSTOMY (N/A) CERVICOESOPHAGO GASTROSTOMY (Right) Flexible Bronchoscopy  Subjective: Patient eating applesauce this am. He states less pain in groins  Objective: Vital signs in last 24 hours: Temp:  [97.4 F (36.3 C)-97.9 F (36.6 C)] 97.8 F (36.6 C) (03/17 0351) Pulse Rate:  [91] 91 (03/16 1736) Cardiac Rhythm: Normal sinus rhythm (03/17 0351) Resp:  [14-26] 14 (03/17 0001) BP: (124-144)/(78-87) 135/87 (03/17 0351) SpO2:  [99 %-100 %] 100 % (03/17 0351) Weight:  [56.4 kg] 56.4 kg (03/17 0400)     Intake/Output from previous day: 03/16 0701 - 03/17 0700 In: 725 [P.O.:50; I.V.:10; NG/GT:665] Out: 1875 [Urine:1475; Chest Tube:400]   Physical Exam:  Cardiovascular: RRR Pulmonary: Clear to auscultation on the right and  diminished left base Abdomen: Soft, right sided tenderness, bowel sounds present. Extremities: No LE edema Wounds: Clean and dry.  No erythema or signs of infection.    Lab Results: CBC: Recent Labs    06/26/18 0500  WBC 7.0  HGB 8.5*  HCT 27.0*  PLT 267   BMET:  Recent Labs    06/26/18 0500  NA 135  K 3.7  CL 100  CO2 29  GLUCOSE 113*  BUN 9  CREATININE 0.74  CALCIUM 8.1*    PT/INR:  No results for input(s): LABPROT, INR in the last 72 hours. ABG:  INR: Will add last result for INR, ABG once components are confirmed Will add last 4 CBG results once components are confirmed  Assessment/Plan: 1. CV- Hypertensive this am. On Amlodipine 10 mg daily, Carvedilol 12.5 mg bid, Lisinopril 20 mg daily, and Catapres patch 0.2 mg.  2. Pulmonary-S/p lymphangiogram, fenestration of small lymphatic ducts mid epigastric region 03/14. S/p left Pleur X catheter 03/09  for recurrent  pleural effusion and chylothorax. Catheter to be drained yesterday-400 drainage.  On 2-3 liters of oxygen via [place].  As discussed with Dr. Servando Snare, will ask IR to do a right thoracentesis in am. Check CXR in am. Encourage incentive spirometer and flutter valve 3. GI-Low fat TFs via J tube and no fat MCT diet 4. Anemia-Last H and H stable at 8.5 and 27 .  5. Will ask social work for assistance with placement after discharge as patient lives in hotel   [place] 06/27/2018,7:49 AM [phone]  I have seen and examined Aaron Wang and agree with the above assessment  and plan.  [name] MD Beeper [phone] Office [phone] 06/27/2018 7:55 PM

## 2018-06-28 ENCOUNTER — Inpatient Hospital Stay (HOSPITAL_COMMUNITY): Payer: Medicaid Other

## 2018-06-28 ENCOUNTER — Encounter (HOSPITAL_COMMUNITY): Payer: Self-pay | Admitting: Student

## 2018-06-28 HISTORY — PX: IR THORACENTESIS ASP PLEURAL SPACE W/IMG GUIDE: IMG5380

## 2018-06-28 LAB — GLUCOSE, CAPILLARY
Glucose-Capillary: 102 mg/dL — ABNORMAL HIGH (ref 70–99)
Glucose-Capillary: 110 mg/dL — ABNORMAL HIGH (ref 70–99)
Glucose-Capillary: 112 mg/dL — ABNORMAL HIGH (ref 70–99)
Glucose-Capillary: 91 mg/dL (ref 70–99)
Glucose-Capillary: 98 mg/dL (ref 70–99)

## 2018-06-28 LAB — BASIC METABOLIC PANEL
Anion gap: 7 (ref 5–15)
BUN: 7 mg/dL (ref 6–20)
CO2: 31 mmol/L (ref 22–32)
Calcium: 8.9 mg/dL (ref 8.9–10.3)
Chloride: 100 mmol/L (ref 98–111)
Creatinine, Ser: 0.7 mg/dL (ref 0.61–1.24)
GFR calc Af Amer: >60 mL/min (ref >60)
GFR calc non Af Amer: >60 mL/min (ref >60)
Glucose, Bld: 104 mg/dL — ABNORMAL HIGH (ref 70–99)
POTASSIUM: 4 mmol/L (ref 3.5–5.1)
Sodium: 138 mmol/L (ref 135–145)

## 2018-06-28 LAB — GRAM STAIN

## 2018-06-28 MED ORDER — LIDOCAINE HCL 1 % IJ SOLN
INTRAMUSCULAR | Status: AC
  Filled 2018-06-28: qty 20

## 2018-06-28 MED ORDER — LIDOCAINE HCL 1 % IJ SOLN
INTRAMUSCULAR | Status: DC | PRN
  Administered 2018-06-28: 10 mL

## 2018-06-28 NOTE — Plan of Care (Addendum)
  Problem: Bowel/Gastric: Goal: Gastrointestinal status for postoperative course will improve Outcome: Completed/Met   Pt stating having daily or BID BM, more liquid/soft--likely related to continuous tube feedings.   Problem: Clinical Measurements: Goal: Ability to maintain clinical measurements within normal limits will improve Outcome: Adequate for Discharge   Problem: Activity: Goal: Risk for activity intolerance will decrease Outcome: Progressing   Problem: Nutrition: Goal: Adequate nutrition will be maintained Outcome: Progressing   Pt requesting night time tube feeds. RN consulted with CVTS PA and dietician--to re-eval and treat. Pt declining tube feed at this time after being NPO for thoracentesis today, states "to start back at 10pm tonight". Pt ate 75% lunch today. Will continue to monitor.  Problem: Coping: Goal: Level of anxiety will decrease Outcome: Completed/Met   Problem: Elimination: Goal: Will not experience complications related to bowel motility Outcome: Completed/Met    RN offered to ambulate with patient several times throughout shift, pt declined.   Pt. Declined pleur-ex draining at this time, will have night shift follow up before bedtime.

## 2018-06-28 NOTE — Progress Notes (Signed)
PT Cancellation Note  Patient Details Name: Aaron Wang MRN: [id number] DOB: 1955-04-14   Cancelled Treatment:    Reason Eval/Treat Not Completed: Patient declined, no reason specified. Pt refusing due to recent procedure despite encouragement for out of bed mobility.  Ellamae Sia, PT, DPT Acute Rehabilitation Services Pager [phone] Office [phone]    Willy Eddy 06/28/2018, 2:14 PM

## 2018-06-28 NOTE — Procedures (Signed)
PROCEDURE SUMMARY:  Successful US guided right pleural effusion thoracentesis. Yielded 600 mL of amber fluid. Pt tolerated procedure well. No immediate complications.  Specimen was sent for labs. CXR ordered.  EBL < 5 mL  Docia Barrier PA-C 06/28/2018 10:18 AM

## 2018-06-28 NOTE — Progress Notes (Addendum)
      [address]       [place] [address]             [phone]       4 Days Post-Op Procedure(s) (LRB): T duct embolization (N/A)  23  Days Post-Op Procedure(s) (LRB): TRANSHIATAL TOTAL ESOPHAGECTOMY (N/A) PYLOROMYOTOMY (N/A) FEEDING JEJUNOSTOMY (N/A) CERVICOESOPHAGO GASTROSTOMY (Right) Flexible Bronchoscopy  Subjective: Patient washing some of his clothes this am. He is walking around his room without oxygen. He states his breathing is "pretty good" this morning.   Objective: Vital signs in last 24 hours: Temp:  [97.5 F (36.4 C)-98.1 F (36.7 C)] 98.1 F (36.7 C) (03/18 0300) Pulse Rate:  [83-93] 91 (03/18 0300) Cardiac Rhythm: Normal sinus rhythm (03/18 0703) Resp:  [15-28] 20 (03/18 0300) BP: (133-148)/(77-89) 148/89 (03/18 0300) SpO2:  [95 %-100 %] 98 % (03/18 0300) Weight:  [59.2 kg] 59.2 kg (03/18 0300)     Intake/Output from previous day: 03/17 0701 - 03/18 0700 In: 944 [P.O.:480; I.V.:10; NG/GT:454] Out: 2125 [Urine:1875; Chest Tube:250]   Physical Exam:  Cardiovascular: RRR Pulmonary: Clear to auscultation on the right and  diminished left base Abdomen: Soft, right sided tenderness, bowel sounds present. Extremities: No LE edema Wounds: Clean and dry.  No erythema or signs of infection.    Lab Results: CBC: Recent Labs    06/26/18 0500  WBC 7.0  HGB 8.5*  HCT 27.0*  PLT 267   BMET:  Recent Labs    06/26/18 0500 06/28/18 0518  NA 135 138  K 3.7 4.0  CL 100 100  CO2 29 31  GLUCOSE 113* 104*  BUN 9 7  CREATININE 0.74 0.70  CALCIUM 8.1* 8.9    PT/INR:  No results for input(s): LABPROT, INR in the last 72 hours. ABG:  INR: Will add last result for INR, ABG once components are confirmed Will add last 4 CBG results once components are confirmed  Assessment/Plan: 1. CV- SBP mostly in the 140's, which is improved from the 160's. On Amlodipine 10 mg daily, Carvedilol 12.5 mg bid, Lisinopril 20 mg daily, and  Catapres patch 0.2 mg.  2. Pulmonary-S/p lymphangiogram, fenestration of small lymphatic ducts mid epigastric region 03/14. 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 Catheter drained yesterday-250 drainage.  On 2-3 liters of oxygen via [place].  Consulted IR to do a right thoracentesis.  Encourage incentive spirometer and flutter valve 3. GI-Low fat TFs via J tube and no fat MCT diet 4. Anemia-Last H and H stable at 8.5 and 27 .  5. Social work assisting with placement after discharge as patient lives in hotel.    Sharalyn Ink ZimmermanPA-C 06/28/2018,7:44 AM [phone]   I have seen and examined Aaron Wang and agree with the above assessment  and plan.  [name] MD Beeper [phone] Office [phone] 06/28/2018 8:46 AM   Patient will not be able to return to hotel room by himself , will not have reliable food source  I have seen and examined Aaron Wang and agree with the above assessment  and plan.  [name] MD Beeper [phone] Office [phone] 06/28/2018 8:49 AM

## 2018-06-29 LAB — GLUCOSE, CAPILLARY
Glucose-Capillary: 105 mg/dL — ABNORMAL HIGH (ref 70–99)
Glucose-Capillary: 108 mg/dL — ABNORMAL HIGH (ref 70–99)
Glucose-Capillary: 111 mg/dL — ABNORMAL HIGH (ref 70–99)
Glucose-Capillary: 113 mg/dL — ABNORMAL HIGH (ref 70–99)
Glucose-Capillary: 97 mg/dL (ref 70–99)

## 2018-06-29 LAB — TRIGLYCERIDES, BODY FLUIDS: Triglycerides, Fluid: 25 mg/dL

## 2018-06-29 MED ORDER — VITAL 1.5 CAL PO LIQD: 1000.0000 mL | ORAL | Status: DC

## 2018-06-29 MED ORDER — VITAL 1.5 CAL PO LIQD
1000.0000 mL | ORAL | Status: DC
  Administered 2018-06-30 – 2018-07-01 (×2): 1000 mL
  Filled 2018-06-29 (×6): qty 1000

## 2018-06-29 NOTE — Progress Notes (Signed)
Patient ID: Aaron Wang, male   DOB: [birth year], 63 y.o.   MRN: [id number] TCTS DAILY ICU PROGRESS NOTE                   [address]            [place] [address]          [phone]   5 Days Post-Op Procedure(s) (LRB): T duct embolization (N/A)  Total Length of Stay:  LOS: 31 days   Subjective: Feels better today, eating better.  Objective: Vital signs in last 24 hours: Temp:  [97.4 F (36.3 C)-98 F (36.7 C)] 97.4 F (36.3 C) (03/19 0844) Pulse Rate:  [87-89] 89 (03/19 0844) Cardiac Rhythm: Normal sinus rhythm (03/19 0700) Resp:  [17-19] 17 (03/19 0844) BP: (123-134)/(74-87) 123/74 (03/19 0844) SpO2:  [98 %-100 %] 100 % (03/19 0844) Weight:  [52.4 kg] 52.4 kg (03/19 0523)  Filed Weights   06/27/18 0400 06/28/18 0300 06/29/18 0523  Weight: 56.4 kg 59.2 kg 52.4 kg    Weight change: -6.8 kg   Hemodynamic parameters for last 24 hours:    Intake/Output from previous day: 03/18 0701 - 03/19 0700 In: 780 [P.O.:780] Out: 1150 [Urine:850; Chest Tube:300]  Intake/Output this shift: Total I/O In: 240 [P.O.:240] Out: -   Current Meds: Scheduled Meds: . amLODipine  10 mg Oral Daily  . carvedilol  12.5 mg Oral BID WC  . chlorhexidine  15 mL Mouth/Throat BID  . Chlorhexidine Gluconate Cloth  6 each Topical Daily  . cloNIDine  0.2 mg Transdermal Weekly  . feeding supplement (PRO-STAT SUGAR FREE 64)  30 mL Per Tube Daily  . free water  100 mL Per Tube Q8H  . guaiFENesin  600 mg Oral BID  . lisinopril  20 mg Oral Daily  . pantoprazole  40 mg Oral BID  . sodium chloride flush  10-40 mL Intracatheter Q12H   Continuous Infusions: . feeding supplement (VITAL 1.5 CAL)    . lactated ringers Stopped (06/22/18 1349)   PRN Meds:.oxyCODONE **AND** acetaminophen, diphenoxylate-atropine, labetalol, lidocaine, lidocaine, ondansetron (ZOFRAN) IV, potassium chloride, sodium chloride flush  General appearance: alert, cooperative and no distress Heart:  regular rate and rhythm, S1, S2 normal, no murmur, click, rub or gallop Lungs: diminished breath sounds bibasilar Abdomen: soft, non-tender; bowel sounds normal; no masses,  no organomegaly Extremities: extremities normal, atraumatic, no cyanosis or edema and Homans sign is negative, no sign of DVT Wound: intact neck and abdomen   Lab Results: CBC:No results for input(s): WBC, HGB, HCT, PLT in the last 72 hours. BMET:  Recent Labs    06/28/18 0518  NA 138  K 4.0  CL 100  CO2 31  GLUCOSE 104*  BUN 7  CREATININE 0.70  CALCIUM 8.9    CMET: Lab Results  Component Value Date   WBC 7.0 06/26/2018   HGB 8.5 (L) 06/26/2018   HCT 27.0 (L) 06/26/2018   PLT 267 06/26/2018   GLUCOSE 104 (H) 06/28/2018   CHOL 94 06/11/2018   TRIG 55 06/11/2018   HDL 27 (L) 06/11/2018   LDLDIRECT 112 (H) 11/26/2009   LDLCALC 56 06/11/2018   ALT 16 06/22/2018   AST 16 06/22/2018   NA 138 06/28/2018   K 4.0 06/28/2018   CL 100 06/28/2018   CREATININE 0.70 06/28/2018   BUN 7 06/28/2018   CO2 31 06/28/2018   TSH 0.925 11/26/2009   PSA 0.63 04/16/2010   INR 1.2 06/19/2018  MICROALBUR 0.50 01/01/2010      PT/INR: No results for input(s): LABPROT, INR in the last 72 hours. Radiology: Dg Chest 1 View  Result Date: 06/28/2018 CLINICAL DATA:  Pleural effusion.  Thoracentesis. EXAM: CHEST  1 VIEW COMPARISON:  Two days ago FINDINGS: Small residual right pleural effusion. No re-expansion edema or visible pneumothorax. A tunneled pleural catheter is present on the left. Emphysema. High-density from embolization material. Continued streaky density in the lower lungs. Normal heart size. IMPRESSION: No complicating feature after thoracentesis. Residual pleural fluid is small volume. Electronically Signed   By: Monte Fantasia M.D.   On: 06/28/2018 10:19   Ir Thoracentesis Asp Pleural Space W/img Guide  Result Date: 06/28/2018 INDICATION: Patient with history of chylothorax status post embolization  procedure, and tunneled pleural catheter placed on the left. Now with recurrent right pleural effusion. Request is made for diagnostic and therapeutic thoracentesis. EXAM: ULTRASOUND GUIDED DIAGNOSTIC AND THERAPEUTIC RIGHT THORACENTESIS MEDICATIONS: 10 mL 1% lidocaine COMPLICATIONS: None immediate. PROCEDURE: An ultrasound guided thoracentesis was thoroughly discussed with the patient and questions answered. The benefits, risks, alternatives and complications were also discussed. The patient understands and wishes to proceed with the procedure. Written consent was obtained. Ultrasound was performed to localize and mark an adequate pocket of fluid in the right chest. The area was then prepped and draped in the normal sterile fashion. 1% Lidocaine was used for local anesthesia. Under ultrasound guidance a 6 Fr Safe-T-Centesis catheter was introduced. Thoracentesis was performed. The catheter was removed and a dressing applied. FINDINGS: A total of approximately 600 mL of amber fluid was removed. Samples were sent to the laboratory as requested by the clinical team. IMPRESSION: Successful ultrasound guided diagnostic and therapeutic right thoracentesis yielding 600 mL of pleural fluid. Read by: Brynda Greathouse PA-C Electronically Signed   By: Jacqulynn Cadet M.D.   On: 06/28/2018 10:54     Assessment/Plan: S/P Procedure(s) (LRB): T duct embolization (N/A) Mobilize Plan for discharge poss tomorrow with pleur ix drain and night time tube feeding      [name] 06/29/2018 8:53 AM

## 2018-06-29 NOTE — Progress Notes (Signed)
Physical Therapy Treatment Patient Details Name: Aaron Wang MRN: [id number] DOB: February 28, 1956 Today's Date: 06/29/2018    History of Present Illness Pt is a 63 y.o. male admitted 05/29/18 with h/o esophageal cancer and increased difficulty swallowing. S/p transhiatal total esophagectomy, pyloromyotomy, J-tube insertion; extubated same day. Reintubated 2/22-2/23; and again 2/23-2/25, also with asystolic arrest requiring CPR 2/23. Pt with pleural effusion s/p thoracentesis 2/28 and 3/6.    PT Comments    Pt is independent with all functional mobility, ambulating 1000 feet without AD. Pt is now on RA. No further skilled PT intervention indicated. All goals met. No follow up services needed. Discharge recommendations updated.  Pt encouraged to continue hallway ambulation with nursing. PT signing off.   Follow Up Recommendations  No PT follow up     Equipment Recommendations  None recommended by PT    Recommendations for Other Services       Precautions / Restrictions Precautions Precautions: Other (comment) Precaution Comments: J tube    Mobility  Bed Mobility Overal bed mobility: Independent                Transfers Overall transfer level: Independent                  Ambulation/Gait Ambulation/Gait assistance: Independent;Supervision Gait Distance (Feet): 1000 Feet Assistive device: None Gait Pattern/deviations: Step-through pattern;Decreased stride length Gait velocity: Decreased Gait velocity interpretation: 1.31 - 2.62 ft/sec, indicative of limited community ambulator General Gait Details: Slight shuffle. Supervision provided. No physical assist needed. No LOB noted. Pt now on RA.    Stairs             Wheelchair Mobility    Modified Rankin (Stroke Patients Only)       Balance   Sitting-balance support: Feet supported;No upper extremity supported Sitting balance-Leahy Scale: Good     Standing balance support: No upper extremity  supported;During functional activity Standing balance-Leahy Scale: Good                              Cognition Arousal/Alertness: Awake/alert Behavior During Therapy: WFL for tasks assessed/performed Overall Cognitive Status: Within Functional Limits for tasks assessed                                        Exercises      General Comments        Pertinent Vitals/Pain Pain Assessment: No/denies pain    Home Living                      Prior Function            PT Goals (current goals can now be found in the care plan section) Acute Rehab PT Goals Patient Stated Goal: home PT Goal Formulation: All assessment and education complete, DC therapy Time For Goal Achievement: 07/01/18 Potential to Achieve Goals: Good Progress towards PT goals: Goals met/education completed, patient discharged from PT    Frequency    Min 3X/week      PT Plan Discharge plan needs to be updated    Co-evaluation              AM-PAC PT "6 Clicks" Mobility   Outcome Measure  Help needed turning from your back to your side while in a flat bed without using bedrails?: None Help needed  moving from lying on your back to sitting on the side of a flat bed without using bedrails?: None Help needed moving to and from a bed to a chair (including a wheelchair)?: None Help needed standing up from a chair using your arms (e.g., wheelchair or bedside chair)?: None Help needed to walk in hospital room?: None Help needed climbing 3-5 steps with a railing? : A Little 6 Click Score: 23    End of Session Equipment Utilized During Treatment: Gait belt Activity Tolerance: Patient tolerated treatment well Patient left: Other (comment);with nursing/sitter in room(standing in room) Nurse Communication: Mobility status PT Visit Diagnosis: Other abnormalities of gait and mobility (R26.89)     Time: 5502-7142 PT Time Calculation (min) (ACUTE ONLY): 12  min  Charges:  $Gait Training: 8-22 mins                     Lorrin Goodell, PT  Office # [phone] Pager [phone]    Lorriane Shire 06/29/2018, 2:10 PM

## 2018-06-29 NOTE — Progress Notes (Addendum)
Nutrition Follow-up  DOCUMENTATION CODES:   Severe malnutrition in context of acute illness/injury  INTERVENTION:   Nocturnal TF via J-tube: - Vital 1.5 @ 60 ml/hr x 12 hours (run from 2000-0800) - Continue free water flushes 100 ml q 8 hours per MD  Nocturnal tube feeding regimen and current free water flushes provide 1080 kcal, 49 grams of protein, and 850 ml of H2O (meets 58% of kcal needs, 52% of protein needs).  - Snacks TID between meals  - d/c Pro-stat 30 ml daily per tube  NUTRITION DIAGNOSIS:   Severe Malnutrition related to acute illness (s/p esophagectomy d/t esophageal cancer) as evidenced by moderate fat depletion, severe fat depletion, moderate muscle depletion, mild muscle depletion.  Ongoing, being addressed via nocturnal TF  GOAL:   Patient will meet greater than or equal to 90% of their needs  Progressing, being addressed via nocturnal TF  MONITOR:   PO intake, Labs, Weight trends, TF tolerance, I & O's  REASON FOR ASSESSMENT:   Consult Enteral/tube feeding initiation and management  ASSESSMENT:   63 year old male w/ PMH of HTN, GERD, and esophageal cancer. On 2/17 he underwent a thoracotomy, pyloroplasty, complete esophagectomy, and had a feeding jejunostomy placed.   RD re-consulted to evaluate for possible transition to nocturnal feedings.  2/22 - intubatedd/t respiratory insufficiency, bronchoscopy performed and revealed pt aspirated on gastric contents, green fluid was found in trachea running into each mainstem bronchus bilaterally 2/23 - extubated then re-intubated d/t inability to maintain oxygenation 2/23 - CPR started after suffering asystolic arrest after laying flat for intubation 2/25 - extubated 3/6 - s/p left thoracentesis (1.5 L chylous yellow fluid removed) 3/9 - s/pinsertion pleural drainage catheter 3/14 - s/p fenestration of lymphatic ducts, lymphangiogram 3/18 - s/p right pleural effusion thoracentesis with 600 ml of amber  fluid removed  Suspect weight of 115 lbs entered in error today as pt had been fluctuating between 125-134 lbs since admission.  Discussed pt with RN. Plan is to transition to nocturnal TF so that pt can eat during the day.  Spoke with pt at bedside. Pt reports RD woke him up and appears lethargic. Pt aware of plan to transition to nocturnal TF. Pt reports that he is eating "much better." Meal completion has improved.  Will order nocturnal feeds over 12 hours at a rate that will meet ~55% of pt's estimated kcal and protein needs given pt's PO intake has improved. MD ordering free water flushes.  Will continue to monitor PO intake and increase rate of nocturnal TF to meet a higher percentage of pt's estimated needs if PO declines.  Current TF: Vital 1.5 @ 50 ml/hr, Pro-stat 30 ml daily, free water 100 ml q 8 hours  Meal Completion: 0-100% x 8 meals (average 34%)  Medications reviewed and include: Protonix  Labs reviewed. CBG's: 111, 108, 102, 110 x 24 hours  UOP: 850 ml x 24 hours CT output: 300 ml x 24 hours I/O's: +17.9 L since admit  Diet Order:   Diet Order            DIET SOFT Room service appropriate? Yes; Fluid consistency: Thin  Diet effective now              EDUCATION NEEDS:   Education needs have been addressed  Skin:  Skin Assessment: Skin Integrity Issues: Incisions: abdomen, neck Other: left pleurex drain  Last BM:  06/26/18  Height:   Ht Readings from Last 1 Encounters:  05/29/18 5\' 7"  (  1.702 m)    Weight:   Wt Readings from Last 1 Encounters:  06/29/18 52.4 kg    Ideal Body Weight:  67.27 kg  BMI:  Body mass index is 18.09 kg/m.  Estimated Nutritional Needs:   Kcal:  1850-2050  Protein:  95-110 grams  Fluid:  > 1.8 L    Gaynell Face, MS, RD, LDN Inpatient Clinical Dietitian Pager: [phone] Weekend/After Hours: [phone]

## 2018-06-29 NOTE — TOC Progression Note (Signed)
Transition of Care [facility]) - Progression Note    Patient Details  Name: LEVY CEDANO MRN: [id number] Date of Birth: [birth year]  Transition of Care [facility]) CM/SW [place], [place] Phone Number: 06/29/2018, 5:40 PM  Clinical Narrative:    CSW visit with the patient at bedside and informed him of the barriers with getting ST rehab at [facility] and going home with [facility]. Patient express appreciation for CSW and CM for updates and assistance.  CSW  Inquired again, about his long term plan. Patient states he has been living at the hotel for 4 years, he knows the area and is comfortable there. He believes he is too independent to be in a nursing home for long term care, he only needs help with his plurex catheter and MD states he may be able to return to the hospital for drianing. Patient states SNF only for ST rehab if eligible.   Expected Discharge Plan: [place]    Expected Discharge Plan and Services Expected Discharge Plan: [place] In-house Referral: Clinical Social Work Discharge Planning Services: CM Consult Post Acute Care Choice: NA                   DME Arranged: N/A DME Agency: NA HH Arranged: NA HH Agency: NA   Social Determinants of Health (SDOH) Interventions    Readmission Risk Interventions No flowsheet data found.

## 2018-06-29 NOTE — Progress Notes (Signed)
Talked to [facility] regarding Home health care arrangement. Difficult to find the agency for him due to his circumstance. Patient suggested that he willing to come doctor's office for Plure-x drain. Explained patient that it is not sure of it, need to discuss with Dr. Servando Snare tomorrow. Pt understood it well. Plure-x drain is daily and amount is getting decreased. 3/10 1000 ml; color was milky color, 3/11 560 ml, 3/12 300 ml, 3/14 750 ml; color changed to serosanguineous , 3/15 675 ml, 3/16 400 ml, 3/17, 250 ml, 3/18 300 ml and 3/19 130 ml. HS [employer]

## 2018-06-29 NOTE — TOC Progression Note (Signed)
Transition of Care [facility]) - Progression Note    Patient Details  Name: Aaron Wang MRN: [id number] Date of Birth: Jun 15, 1955  Transition of Care [facility]) CM/SW [place], [place] Phone Number: 06/29/2018, 4:28 PM  Clinical Narrative:    CSW called and spoke with the patient's step daughter, [name]. [name] states she is unable to provide the care that is needed for her step-father because she is buying a house and is currently living at a hotel  with her children.   CSW resent referral for SNF and spoke with Admissions Coordinator at  [facility], [place] and requested she review case to determine if the patient is  appropriate for SNF. She will review and follow up with CSW if she is able to make offer.  CSW reached to to the Assistance Coordinator, [place] program to see if the patient may eligible but CSW was informed patient needs were too complex for their program.  CSW updated CSW Surveyor, quantity, [name] on challenges with Placement(SNF), receiving the needed services in the community(HH), patient do not meet criteria for ALF, PT recommendation(patient independent-no follow up),  and the possibility of SNF denial. CSW was advise to update RN so they can continue to work with  patient on draining catheter and tube feeds.       Expected Discharge Plan: [place]    Expected Discharge Plan and Services Expected Discharge Plan: [place] In-house Referral: Clinical Social Work Discharge Planning Services: CM Consult Post Acute Care Choice: NA                   DME Arranged: N/A DME Agency: NA HH Arranged: NA HH Agency: NA   Social Determinants of Health (SDOH) Interventions    Readmission Risk Interventions No flowsheet data found.

## 2018-06-29 NOTE — Care Management (Addendum)
  CM discussed case with TOC Director -  CSW will work to place pt in SNF to inability to locate [facility] needed at discharge and the need for catheter management and required frequency for draining of catheter.  CM discussed discharge barriers with home discharge - CVTS acknowledge and agree to SNF as discharge plan. CSW will facilitate discharge to SNF.  CM discussed HH barrier with pt - pt is in agreement for SNF at discharge  Update:  [facility]  (Branch Declined) and [facility](unable to take Pleurx)  refused pt.  [facility] has also declined pt - stating needs are greater than HH., [facility] declined based on insurance.  [facility] declined due to plurex catheter.  [facility] declined pt. [facility] declined pt.  CM left VM for [facility].  Pt given choice of HH and DME per medicare.gov list.  Pt chose [facility] for DME - tentative referral accepted pending orders.  CM acknowledges consult placed this am for HH/DME including HHRN/tube feeds and pluerx catheter.  CM actively working on finding an accepting agency with current insurance, current complexity and current living situation (pt will return to motel at discharge)  Outstanding barriers to discharge include; awaiting [place] orders, tube feed pumpl/equipment orders with nutritional specifics, accepting [facility] agency.

## 2018-06-30 LAB — GLUCOSE, CAPILLARY
Glucose-Capillary: 100 mg/dL — ABNORMAL HIGH (ref 70–99)
Glucose-Capillary: 101 mg/dL — ABNORMAL HIGH (ref 70–99)
Glucose-Capillary: 104 mg/dL — ABNORMAL HIGH (ref 70–99)
Glucose-Capillary: 106 mg/dL — ABNORMAL HIGH (ref 70–99)
Glucose-Capillary: 115 mg/dL — ABNORMAL HIGH (ref 70–99)
Glucose-Capillary: 93 mg/dL (ref 70–99)

## 2018-06-30 NOTE — Progress Notes (Signed)
Drain PleurX for 75cc of serous drainage. Talked pt through the steps of doing the drain. Pt had difficulty and was not able to change dressing as unable to safely to his side/back.

## 2018-06-30 NOTE — Plan of Care (Signed)
  Problem: Nutritional: Goal: Ability to achieve adequate nutritional intake will improve Outcome: Progressing   Problem: Clinical Measurements: Goal: Postoperative complications will be avoided or minimized Outcome: Adequate for Discharge   Problem: Respiratory: Goal: Ability to maintain a clear airway will improve Outcome: Completed/Met   Problem: Skin Integrity: Goal: Demonstration of wound healing without infection will improve Outcome: Completed/Met   Problem: Education: Goal: Knowledge of General Education information will improve Description Including pain rating scale, medication(s)/side effects and non-pharmacologic comfort measures Outcome: Progressing. Pt plans to observe pleur-x draining today and perform hands on tomorrow under nursing supervision in preparation of performing at home.

## 2018-06-30 NOTE — Progress Notes (Addendum)
      [address]       ,[place] [address]             [phone]      6 Days Post-Op Procedure(s) (LRB): T duct embolization (N/A)   Subjective:  No new complaints.    Objective: Vital signs in last 24 hours: Temp:  [97.5 F (36.4 C)-98.1 F (36.7 C)] 97.8 F (36.6 C) (03/20 0747) Pulse Rate:  [78-136] 78 (03/20 0336) Cardiac Rhythm: Normal sinus rhythm (03/20 0700) Resp:  [15-24] 23 (03/20 0747) BP: (119-135)/(66-82) 135/66 (03/20 0747) SpO2:  [86 %-98 %] 93 % (03/20 0336) Weight:  [52.7 kg] 52.7 kg (03/20 0500)  Intake/Output from previous day: 03/19 0701 - 03/20 0700 In: 1896 [P.O.:480; I.V.:40; NG/GT:1376] Out: 681 [Urine:550; Stool:1; Chest Tube:130]  General appearance: alert, cooperative and no distress Heart: regular rate and rhythm Lungs: clear to auscultation bilaterally Abdomen: soft, non-tender; bowel sounds normal; no masses,  no organomegaly Extremities: extremities normal, atraumatic, no cyanosis or edema  Lab Results: No results for input(s): WBC, HGB, HCT, PLT in the last 72 hours. BMET:  Recent Labs    06/28/18 0518  NA 138  K 4.0  CL 100  CO2 31  GLUCOSE 104*  BUN 7  CREATININE 0.70  CALCIUM 8.9    PT/INR: No results for input(s): LABPROT, INR in the last 72 hours. ABG    Component Value Date/Time   PHART 7.479 (H) 06/07/2018 0733   HCO3 24.8 06/07/2018 0733   TCO2 26 06/07/2018 0733   ACIDBASEDEF 3.0 (H) 05/30/2018 0400   O2SAT 66.6 06/10/2018 0518   CBG (last 3)  Recent Labs    06/29/18 1943 06/29/18 2356 06/30/18 0335  GLUCAP 105* 115* 104*    Assessment/Plan: S/P Procedure(s) (LRB): T duct embolization (N/A)  1. CV-hemodynamically stable 2. GI- continue nightly tube feeds 3. Pulm- Pleur-x catheter in place, draining as ordered 4. Dispo- patient lives alone in a hotel, issues with H/H not being available to assist patient with tube feedings and drainage of catheter, to meet today, d/c plans per  Dr. Servando Snare   LOS: 32 days    Ellwood Handler 06/30/2018   If patient returns to hotel where he has been living will still need home nurse  to drain pleurix , will not work to return to hospital daily for drainage Drainage is clearing and decreasing so hope will not need pleurix  long time I have seen and examined Perlie Mayo and agree with the above assessment  and plan.  [name] MD [facility] [phone] Office [phone] 06/30/2018 2:55 PM

## 2018-06-30 NOTE — Progress Notes (Addendum)
[facility]- not able to offer, pt walking 1000 feet.   [facility]- will look at. CSW called and spoke with [place].   [facility] left a message with Irine Seal about patient and asked to look at. Update: Not able to take medicaid patients at this time.   [facility]- Will look at and check with director to see if they are able to offer.   CSW will keep checking for SNF placement.   Domenic Schwab, MSW, [place]

## 2018-07-01 LAB — GLUCOSE, CAPILLARY: Glucose-Capillary: 112 mg/dL — ABNORMAL HIGH (ref 70–99)

## 2018-07-01 MED ORDER — ALUM & MAG HYDROXIDE-SIMETH 200-200-20 MG/5ML PO SUSP
30.0000 mL | ORAL | Status: DC | PRN
  Administered 2018-07-01: 30 mL via ORAL
  Filled 2018-07-01: qty 30

## 2018-07-01 NOTE — Progress Notes (Addendum)
Pt c/o ABD pain.  Requested pepto-bismol.  Notified Md. New orders received for maalox. Pt refused pleur-x drain wants to wait until tomorrow.  Educated   [place], [place] T

## 2018-07-01 NOTE — Progress Notes (Addendum)
      [address]       [place] [address]             [phone]      7 Days Post-Op Procedure(s) (LRB): T duct embolization (N/A) Subjective: Feels fine Minor pleurx drainage SW working on arrangements  Objective: Vital signs in last 24 hours: Temp:  [97.4 F (36.3 C)-98 F (36.7 C)] 98 F (36.7 C) (03/21 0804) Pulse Rate:  [86-145] 87 (03/21 0804) Cardiac Rhythm: Normal sinus rhythm (03/21 0700) Resp:  [12-25] 21 (03/21 0804) BP: (123-132)/(76-81) 125/81 (03/21 0804) SpO2:  [98 %-100 %] 99 % (03/21 0804) Weight:  [52.1 kg] 52.1 kg (03/21 0500)  Hemodynamic parameters for last 24 hours:    Intake/Output from previous day: 03/20 0701 - 03/21 0700 In: 966 [P.O.:240; NG/GT:726] Out: 325 [Urine:250; Chest Tube:75] Intake/Output this shift: No intake/output data recorded.  General appearance: alert, cooperative and no distress Heart: regular rate and rhythm Lungs: clear to auscultation bilaterally Abdomen: benign Extremities: no edema or calf tenderness Wound: incis healing well  Lab Results: No results for input(s): WBC, HGB, HCT, PLT in the last 72 hours. BMET: No results for input(s): NA, K, CL, CO2, GLUCOSE, BUN, CREATININE, CALCIUM in the last 72 hours.  PT/INR: No results for input(s): LABPROT, INR in the last 72 hours. ABG    Component Value Date/Time   PHART 7.479 (H) 06/07/2018 0733   HCO3 24.8 06/07/2018 0733   TCO2 26 06/07/2018 0733   ACIDBASEDEF 3.0 (H) 05/30/2018 0400   O2SAT 66.6 06/10/2018 0518   CBG (last 3)  Recent Labs    06/30/18 1935 06/30/18 2323 07/01/18 0507  GLUCAP 100* 106* 112*    Meds Scheduled Meds: . amLODipine  10 mg Oral Daily  . carvedilol  12.5 mg Oral BID WC  . chlorhexidine  15 mL Mouth/Throat BID  . Chlorhexidine Gluconate Cloth  6 each Topical Daily  . cloNIDine  0.2 mg Transdermal Weekly  . free water  100 mL Per Tube Q8H  . guaiFENesin  600 mg Oral BID  . lisinopril  20 mg Oral Daily   . pantoprazole  40 mg Oral BID  . sodium chloride flush  10-40 mL Intracatheter Q12H   Continuous Infusions: . feeding supplement (VITAL 1.5 CAL) 1,000 mL (06/30/18 1934)  . lactated ringers Stopped (06/22/18 1349)   PRN Meds:.oxyCODONE **AND** acetaminophen, diphenoxylate-atropine, labetalol, lidocaine, lidocaine, ondansetron (ZOFRAN) IV, potassium chloride, sodium chloride flush  Xrays No results found.  Assessment/Plan: S/P Procedure(s) (LRB): T duct embolization (N/A)  1 doing well, hemodyn stable in sinus rhythm, good sats on RA, no fevers 2 minor pleurx drainage , painful when done, change to qOD soon, poss d/c soon? 3 could poss do bolus TF's on own if taught how so can avoid issue of Home health    LOS: 33 days    [name] [facility] 07/01/2018 Pager [phone]  States L pleurx drainage only 2 yesterday Doing well patient examined and medical record reviewed,agree with above note. Tharon Aquas Trigt III 07/01/2018

## 2018-07-02 LAB — CHOLESTEROL, BODY FLUID: Cholesterol, Fluid: 62 mg/dL

## 2018-07-02 NOTE — Progress Notes (Addendum)
      [address]       [place] [address]             [phone]      8 Days Post-Op Procedure(s) (LRB): T duct embolization (N/A) Subjective: conts to feel well pleurx to be drained today  Objective: Vital signs in last 24 hours: Temp:  [97.2 F (36.2 C)-98.2 F (36.8 C)] 97.9 F (36.6 C) (03/22 0800) Pulse Rate:  [77-93] 83 (03/22 0800) Cardiac Rhythm: Normal sinus rhythm (03/22 0800) Resp:  [14-23] 17 (03/22 0800) BP: (110-130)/(72-81) 118/72 (03/22 0800) SpO2:  [99 %-100 %] 100 % (03/22 0800) Weight:  [50.3 kg] 50.3 kg (03/22 0500)  Hemodynamic parameters for last 24 hours:    Intake/Output from previous day: 03/21 0701 - 03/22 0700 In: 1282 [P.O.:600; I.V.:10; NG/GT:672] Out: 475 [Urine:475] Intake/Output this shift: No intake/output data recorded.  General appearance: alert, cooperative and no distress Heart: regular rate and rhythm Lungs: clear to auscultation bilaterally Abdomen: benign Extremities: no edema or calf tenderness Wound: incis healing well  Lab Results: No results for input(s): WBC, HGB, HCT, PLT in the last 72 hours. BMET: No results for input(s): NA, K, CL, CO2, GLUCOSE, BUN, CREATININE, CALCIUM in the last 72 hours.  PT/INR: No results for input(s): LABPROT, INR in the last 72 hours. ABG    Component Value Date/Time   PHART 7.479 (H) 06/07/2018 0733   HCO3 24.8 06/07/2018 0733   TCO2 26 06/07/2018 0733   ACIDBASEDEF 3.0 (H) 05/30/2018 0400   O2SAT 66.6 06/10/2018 0518   CBG (last 3)  Recent Labs    06/30/18 1935 06/30/18 2323 07/01/18 0507  GLUCAP 100* 106* 112*    Meds Scheduled Meds: . amLODipine  10 mg Oral Daily  . carvedilol  12.5 mg Oral BID WC  . chlorhexidine  15 mL Mouth/Throat BID  . Chlorhexidine Gluconate Cloth  6 each Topical Daily  . cloNIDine  0.2 mg Transdermal Weekly  . free water  100 mL Per Tube Q8H  . guaiFENesin  600 mg Oral BID  . lisinopril  20 mg Oral Daily  . pantoprazole   40 mg Oral BID  . sodium chloride flush  10-40 mL Intracatheter Q12H   Continuous Infusions: . feeding supplement (VITAL 1.5 CAL) 1,000 mL (07/01/18 1948)  . lactated ringers Stopped (06/22/18 1349)   PRN Meds:.oxyCODONE **AND** acetaminophen, alum & mag hydroxide-simeth, diphenoxylate-atropine, labetalol, lidocaine, lidocaine, ondansetron (ZOFRAN) IV, potassium chloride, sodium chloride flush  Xrays No results found.  Assessment/Plan: S/P Procedure(s) (LRB): T duct embolization (N/A)  1 conts to do very well , eating well, may not need tube feeds or do on own as bolus? 2 see what pleurx drains today, poss d/c soon as home nursing will not come to his hotel room where he lives 3 hemodyn stable in sinus with excellent sats on RA 4 no new labs   LOS: 34 days    Aaron Giovanni PA-C 07/02/2018 Pager [phone]  Patient feels like he is taking adequate nourishment by mouth No shortness of breath Pleurx catheter not yet drained Friday patient examined and medical record reviewed,agree with above note. Aaron Wang 07/02/2018

## 2018-07-02 NOTE — Progress Notes (Signed)
Pleurx drained today removed 125 mL, dressing and incision site clean dry and intact. Patient tolerated well. Will continue to monitor.

## 2018-07-03 ENCOUNTER — Inpatient Hospital Stay (HOSPITAL_COMMUNITY): Payer: Medicaid Other

## 2018-07-03 ENCOUNTER — Ambulatory Visit: Payer: Medicaid Other | Admitting: Cardiology

## 2018-07-03 LAB — CULTURE, BODY FLUID W GRAM STAIN -BOTTLE: Culture: NO GROWTH

## 2018-07-03 NOTE — Plan of Care (Signed)
  Problem: Education: Goal: Knowledge of the prescribed therapeutic regimen will improve Outcome: Progressing   Problem: Education: Goal: Knowledge of General Education information will improve Description Including pain rating scale, medication(s)/side effects and non-pharmacologic comfort measures Outcome: Adequate for Discharge   Problem: Health Behavior/Discharge Planning: Goal: Ability to manage health-related needs will improve Outcome: Adequate for Discharge

## 2018-07-03 NOTE — Care Management (Addendum)
07/03/18 Discharge plan per attending and pt has now reverted back to home without [place].  CM contacted [facility] and confirmed with company that pt can discharge home without [facility] and still receive needed pleurx catheters from company.  CM requested to simply fill out pleurx forms as usual and send back to [place] will follow up with pt directly and ship supplies to address on file.  CM confirmed both address and phone number is accurate in Epic.  Pt informed CM that he plans to independently care for pleurx catheter and dressing changes.  Bedside nursing staff have provided pt education on catheter and dressing changes  06/29/18 CM discussed case with [facility] Director -  CSW will work to place pt in SNF to inability to locate [facility] needed at discharge and the need for catheter management and required frequency for draining of catheter.  CM discussed discharge barriers with home discharge - CVTS acknowledge and agree to SNF as discharge plan. CSW will facilitate discharge to SNF.  CM discussed HH barrier with pt - pt is in agreement for SNF at discharge  Update:  [facility]  (Branch Declined) and [facility](unable to take Pleurx)  refused pt.  [facility] has also declined pt - stating needs are greater than HH., [facility] declined based on insurance.  [facility] declined due to plurex catheter.  [facility] declined pt. [facility] declined pt.  CM left VM for [facility].  Pt given choice of HH and DME per medicare.gov list.  Pt chose [facility] for DME - tentative referral accepted pending orders.  CM acknowledges consult placed this am for HH/DME including HHRN/tube feeds and pluerx catheter.  CM actively working on finding an accepting agency with current insurance, current complexity and current living situation (pt will return to motel at discharge)  Outstanding barriers to discharge include; awaiting [place] orders, tube feed pumpl/equipment orders with nutritional specifics, accepting [facility] agency.

## 2018-07-03 NOTE — Progress Notes (Addendum)
      [address]       ,[place] [address]             [phone]       9 Days Post-Op Procedure(s) (LRB): T duct embolization (N/A)  23  Days Post-Op Procedure(s) (LRB): TRANSHIATAL TOTAL ESOPHAGECTOMY (N/A) PYLOROMYOTOMY (N/A) FEEDING JEJUNOSTOMY (N/A) CERVICOESOPHAGO GASTROSTOMY (Right) Flexible Bronchoscopy  Subjective: Patient states eating fairly well. He has trouble reaching left Pleur X to drain and put dressing on.  Objective: Vital signs in last 24 hours: Temp:  [97.5 F (36.4 C)-98 F (36.7 C)] 98 F (36.7 C) (03/23 0945) Pulse Rate:  [69-83] 83 (03/23 0945) Cardiac Rhythm: Normal sinus rhythm (03/23 0945) Resp:  [12-24] 12 (03/23 0300) BP: (109-122)/(67-77) 116/72 (03/23 0945) SpO2:  [97 %-100 %] 97 % (03/23 0945) Weight:  [53.8 kg] 53.8 kg (03/23 0300)     Intake/Output from previous day: 03/22 0701 - 03/23 0700 In: 352 [P.O.:342; I.V.:10] Out: 700 [Urine:575; Chest Tube:125]   Physical Exam:  Cardiovascular: RRR Pulmonary: Mostly clear Abdomen: Soft, non tender, bowel sounds present. Extremities: No LE edema Wounds: Clean and dry.  No erythema or signs of infection.    Lab Results: CBC: No results for input(s): WBC, HGB, HCT, PLT in the last 72 hours. BMET:  No results for input(s): NA, K, CL, CO2, GLUCOSE, BUN, CREATININE, CALCIUM in the last 72 hours.  PT/INR:  No results for input(s): LABPROT, INR in the last 72 hours. ABG:  INR: Will add last result for INR, ABG once components are confirmed Will add last 4 CBG results once components are confirmed  Assessment/Plan: 1. CV- BP better controlled with the following: On Amlodipine 10 mg daily, Carvedilol 12.5 mg bid, Lisinopril 20 mg daily, and Catapres patch 0.2 mg.  2. Pulmonary-S/p lymphangiogram, fenestration of small lymphatic ducts mid epigastric region 03/14. 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 Catheter drained  yesterday-125 drainage.  Left Pleur X to be drained today then every other day starting tomorrow. On room air. Encourage incentive spirometer and flutter valve 3. GI-Low fat TFs via J tube and no fat MCT diet.  Will stop TFs tonight. 4. Anemia-Last H and H stable at 8.5 and 27 . Re check in am 5. [facility] to discharge patient in the am to hotel. He has several friends to help him and check on him.  Donielle M ZimmermanPA-C 07/03/2018,10:47 AM [phone]   Taking po better , home on diet with j tube in place flushed  Poss home tomorrow  I have seen and examined Aaron Wang and agree with the above assessment  and plan.  [name] MD Beeper [phone] Office [phone] 07/03/2018 12:05 PM

## 2018-07-03 NOTE — TOC Progression Note (Addendum)
Transition of Care [facility]) - Progression Note    Patient Details  Name: Aaron Wang MRN: [id number] Date of Birth: 01-27-1956  Transition of [facility]) CM/SW Contact  Candie Chroman, LCSW Phone Number: 07/03/2018, 8:19 AM  Clinical Narrative: No bed offers yet. CSW has expanded search to outside of [facility].  1:04 pm: [facility] is able to extend a bed offer. Patient aware but stated this was not necessary because he would return home at discharge. Patient stated he already discussed it with the MD this morning. RNCM is aware. Plan for possible discharge home tomorrow per attending note.  Expected Discharge Plan: [place]    Expected Discharge Plan and Services Expected Discharge Plan: [place] In-house Referral: Clinical Social Work Discharge Planning Services: CM Consult Post Acute Care Choice: NA                   DME Arranged: N/A DME Agency: NA HH Arranged: NA HH Agency: NA   Social Determinants of Health (SDOH) Interventions    Readmission Risk Interventions No flowsheet data found.

## 2018-07-03 NOTE — Plan of Care (Signed)
  Problem: Education: Goal: Knowledge of the prescribed therapeutic regimen will improve 07/03/2018 2230 by Brand Males, RN Outcome: Progressing 07/03/2018 2230 by Brand Males, RN Outcome: Progressing   Problem: Nutritional: Goal: Ability to achieve adequate nutritional intake will improve 07/03/2018 2230 by Brand Males, RN Outcome: Progressing 07/03/2018 2230 by Brand Males, RN Outcome: Progressing   Problem: Education: Goal: Knowledge of General Education information will improve Description Including pain rating scale, medication(s)/side effects and non-pharmacologic comfort measures 07/03/2018 2230 by Brand Males, RN Outcome: Progressing 07/03/2018 2230 by Brand Males, RN Outcome: Progressing   Problem: Activity: Goal: Risk for activity intolerance will decrease 07/03/2018 2230 by Brand Males, RN Outcome: Progressing 07/03/2018 2230 by Brand Males, RN Outcome: Progressing   Problem: Nutrition: Goal: Adequate nutrition will be maintained 07/03/2018 2230 by Brand Males, RN Outcome: Progressing 07/03/2018 2230 by Brand Males, RN Outcome: Progressing   Problem: Pain Managment: Goal: General experience of comfort will improve 07/03/2018 2230 by Brand Males, RN Outcome: Progressing 07/03/2018 2230 by Brand Males, RN Outcome: Progressing   Problem: Safety: Goal: Ability to remain free from injury will improve 07/03/2018 2230 by Brand Males, RN Outcome: Progressing 07/03/2018 2230 by Brand Males, RN Outcome: Progressing   Problem: Skin Integrity: Goal: Risk for impaired skin integrity will decrease Outcome: Progressing

## 2018-07-03 NOTE — Progress Notes (Signed)
Pt educated on how to drain plure-x drian, pt performed procedure while RN at bedside talked him through it-pt did will but would benefit from one more education session prior to discharge tomorrow.

## 2018-07-04 LAB — BASIC METABOLIC PANEL
ANION GAP: 6 (ref 5–15)
BUN: 7 mg/dL (ref 6–20)
CO2: 26 mmol/L (ref 22–32)
Calcium: 8.7 mg/dL — ABNORMAL LOW (ref 8.9–10.3)
Chloride: 100 mmol/L (ref 98–111)
Creatinine, Ser: 0.74 mg/dL (ref 0.61–1.24)
GFR calc Af Amer: >60 mL/min (ref >60)
GFR calc non Af Amer: >60 mL/min (ref >60)
Glucose, Bld: 104 mg/dL — ABNORMAL HIGH (ref 70–99)
Potassium: 4 mmol/L (ref 3.5–5.1)
Sodium: 132 mmol/L — ABNORMAL LOW (ref 135–145)

## 2018-07-04 LAB — CBC
HCT: 28.4 % — ABNORMAL LOW (ref 39.0–52.0)
HEMOGLOBIN: 9.1 g/dL — AB (ref 13.0–17.0)
MCH: 28.8 pg (ref 26.0–34.0)
MCHC: 32 g/dL (ref 30.0–36.0)
MCV: 89.9 fL (ref 80.0–100.0)
Platelets: 290 10*3/uL (ref 150–400)
RBC: 3.16 MIL/uL — ABNORMAL LOW (ref 4.22–5.81)
RDW: 14.2 % (ref 11.5–15.5)
WBC: 4.4 10*3/uL (ref 4.0–10.5)
nRBC: 0 % (ref 0.0–0.2)

## 2018-07-04 MED ORDER — FREE WATER: 2 refills | Status: DC

## 2018-07-04 MED ORDER — LISINOPRIL 20 MG PO TABS: 20.0000 mg | ORAL_TABLET | Freq: Every day | ORAL | 1 refills | Status: AC

## 2018-07-04 MED ORDER — CLONIDINE 0.2 MG/24HR TD PTWK: 0.2000 mg | MEDICATED_PATCH | TRANSDERMAL | 10 refills | Status: AC

## 2018-07-04 MED ORDER — OXYCODONE HCL 5 MG PO TABS: 5.0000 mg | ORAL_TABLET | Freq: Four times a day (QID) | ORAL | 0 refills | Status: DC | PRN

## 2018-07-04 MED ORDER — CARVEDILOL 12.5 MG PO TABS: 12.5000 mg | ORAL_TABLET | Freq: Two times a day (BID) | ORAL | 1 refills | Status: AC

## 2018-07-04 MED ORDER — AMLODIPINE BESYLATE 10 MG PO TABS: 10.0000 mg | ORAL_TABLET | Freq: Every day | ORAL | 1 refills | Status: AC

## 2018-07-04 NOTE — Discharge Instructions (Signed)
Please drain left Pleur X catheter every other day, starting 03/26 (so drain Thursday, Saturday Monday, and Wednesday). Record drainage and bring record to office appointment Once output has been < 150 cc for 3 consecutive days, please call [facility] office  ([phone]) for evaluation and possible removal Please flush J tube (red tube) with saline two times daily (morning and at night with 10 ml of free water) Please change J tube dressing daily, beginning 03/25   Esophagectomy  Esophagectomy is a surgical procedure to remove some or all of the swallowing tube that connects the back of your throat to your stomach (esophagus). There are different ways to do esophagectomy. The method that is used will depend on what part of your esophagus needs to be removed and on your surgeon.  If the lower part of the esophagus will be removed, you may only have incisions in your belly, but you may also have an incision on your chest.  If the upper part or all of the esophagus will be removed, you may have incisions in your belly, neck, and chest. You may have this procedure to treat cancer of the esophagus. It also may be done to treat some conditions that prevent the esophagus from working properly. Tell a health care provider about:  Any allergies you have.  All medicines you are taking, including vitamins, herbs, eye drops, creams, and over-the-counter medicines.  Any problems you or family members have had with anesthetic medicines.  Any blood disorders you have.  Any medical conditions you have.  Any surgeries you have had.  Whether you are pregnant or may be pregnant. What are the risks? Generally, this is a safe procedure. However, problems may occur, including:  Bleeding.  Infection.  Frequent nausea and vomiting.  Frequent heartburn.  Voice changes or loss.  Narrowing (stricture) of the esophagus. This blocks or limits swallowing.  Leaking of food or stomach fluids in the neck,  chest, or abdomen.  Damage to other structures or organs.  Blood clots in the legs or lungs.  Pneumonia.  Allergic reactions to medicines or dyes. What happens before the procedure? Staying hydrated Follow instructions from your health care provider about hydration, which may include:  Up to 2 hours before the procedure - you may continue to drink clear liquids, such as water, clear fruit juice, black coffee, and plain tea. Eating and drinking restrictions Follow instructions from your health care provider about eating and drinking, which may include:  8 hours before the procedure - stop eating heavy meals or foods such as meat, fried foods, or fatty foods.  6 hours before the procedure - stop eating light meals or foods, such as toast or cereal.  6 hours before the procedure - stop drinking milk or drinks that contain milk.  2 hours before the procedure - stop drinking clear liquids. Medicines  Ask your health care provider about: ? Changing or stopping your regular medicines. This is especially important if you are taking diabetes medicines or blood thinners. ? Taking medicines such as aspirin and ibuprofen. These medicines can thin your blood. Do not take these medicines before your procedure if your health care provider instructs you not to.  You may be given antibiotic medicine to help prevent infection. General instructions  Ask your health care provider how your surgical site will be marked or identified.  You may be asked to shower with a germ-killing soap.  Plan to have someone take you home from the hospital or clinic.  You may have nutrition counseling to prepare you to manage your diet at home after surgery.  Do not use any products that contain nicotine or tobacco, such as cigarettes and e-cigarettes. If you need help quitting, ask your health care provider. What happens during the procedure?  To reduce your risk of infection: ? Your health care team will  wash or sanitize their hands. ? Your skin will be washed with soap. ? Hair may be removed from the surgical area.  An IV tube will be inserted into one of your veins.  You will be given the following: ? A medicine to help you relax (sedative). ? A medicine to make you fall asleep (general anesthetic).  A tube will be placed into your bladder to drain urine (catheter).  A tube will be placed through your nose and into your stomach to drain stomach fluids (nasogastric tube, or NG tube).  Another IV tube may be placed into a vein in your neck (central venous line).  The surgeon will make an incision in your abdomen to find where your lower esophagus attaches to your stomach. Your esophagus will be separated from your stomach.  Your surgeon will make another incision in your chest or neck to locate the upper part of your esophagus. The affected part will be removed.  The top of your stomach will be brought up to meet the new end of the esophagus. The stomach may be changed into the shape of a tube. The upper end of the stomach will then be attached to the end of the esophagus with stitches (sutures) or staples.  If you are having surgery for cancer, lymph nodes may be removed.  You may have a tube placed into the upper part of your small intestine and brought out through the skin of your belly. This tube will be used to feed you while you recover from surgery (feeding tube).  You will also have tubes put in to drain fluid that builds up after surgery. These drains will be placed near your incisions.  Your incisions will be closed with sutures or staples.  Dressings (bandages) will be placed over the incisions. The procedure may vary among health care providers and hospitals. What happens after the procedure?  You will get pain medicine and fluids through an IV tube, and you may get nutrition through the feeding tube.  Your blood pressure, heart rate, breathing rate, and blood oxygen  level will be monitored until the medicines you were given have worn off.  You will be asked to take deep breaths and cough regularly to clear your lungs. This helps prevent pneumonia.  You may be given medicine to prevent blood clots.  You may have to wear compression stockings. These stockings help to prevent blood clots and reduce swelling in your legs.  You will have an X-ray to check for any leaks where your stomach was attached to your esophagus. If there are no leaks, your nasogastric tube can be removed.  Your surgeon will decide when the rest of your tubes, drains, and IV tubes can be removed. You may go home with a feeding tube.  You will be encouraged to walk.  If you go home with a feeding tube, your health care provider will give you instructions for home care. At first, you may have a home care nurse to help you manage tube feeding and nutrition at home. Summary  Esophagectomy is a surgical procedure to remove a damaged or diseased part of your  esophagus.  Depending on the part of the esophagus that is removed, you may have incisions in your belly, chest, or neck.  You will get pain medicine and fluids through an IV tube, and you may get nutrition through the feeding tube. This information is not intended to replace advice given to you by your health care provider. Make sure you discuss any questions you have with your health care provider. Document Released: 11/27/2015 Document Revised: 11/27/2015 Document Reviewed: 11/27/2015 Elsevier Interactive Patient Education  [employer].

## 2018-07-04 NOTE — Progress Notes (Addendum)
Pt was given verbal discharge and provided with paper copy of AVS. No further questions. VSS at discharge. Patient belonging sent with patient. Plurex kits sent with patient. Patient provide with taxi voucher.  Discharged through [employer]	[place] entrance via wheelchair to taxi. Voucher provided to the driver.

## 2018-07-04 NOTE — Progress Notes (Addendum)
      [address]       [place] [address]             [phone]       10 Days Post-Op Procedure(s) (LRB): T duct embolization (N/A)  23  Days Post-Op Procedure(s) (LRB): TRANSHIATAL TOTAL ESOPHAGECTOMY (N/A) PYLOROMYOTOMY (N/A) FEEDING JEJUNOSTOMY (N/A) CERVICOESOPHAGO GASTROSTOMY (Right) Flexible Bronchoscopy  Subjective: Patient states eating fairly well. He has no specific complaints this am. He is looking forward to getting out of here.  Objective: Vital signs in last 24 hours: Temp:  [97.4 F (36.3 C)-98 F (36.7 C)] 98 F (36.7 C) (03/24 0741) Pulse Rate:  [73-83] 79 (03/24 0741) Cardiac Rhythm: Normal sinus rhythm (03/24 0741) Resp:  [18-20] 19 (03/24 0741) BP: (110-123)/(70-80) 123/80 (03/24 0741) SpO2:  [97 %-100 %] 99 % (03/24 0741) Weight:  [50.3 kg] 50.3 kg (03/24 0425)     Intake/Output from previous day: 03/23 0701 - 03/24 0700 In: 110 [P.O.:100; I.V.:10] Out: 600 [Urine:600]   Physical Exam:  Cardiovascular: RRR Pulmonary: Mostly clear Abdomen: Soft, non tender, bowel sounds present. Extremities: No LE edema Wounds: Clean and dry.  No erythema or signs of infection.  J tube site clean and dry and suture intact  Lab Results: CBC: Recent Labs    07/04/18 0425  WBC 4.4  HGB 9.1*  HCT 28.4*  PLT 290   BMET:  Recent Labs    07/04/18 0425  NA 132*  K 4.0  CL 100  CO2 26  GLUCOSE 104*  BUN 7  CREATININE 0.74  CALCIUM 8.7*    PT/INR:  No results for input(s): LABPROT, INR in the last 72 hours. ABG:  INR: Will add last result for INR, ABG once components are confirmed Will add last 4 CBG results once components are confirmed  Assessment/Plan: 1. CV- BP better controlled with the following: On Amlodipine 10 mg daily, Carvedilol 12.5 mg bid, Lisinopril 20 mg daily, and Catapres patch 0.2 mg.  2. Pulmonary-S/p lymphangiogram, fenestration of small lymphatic ducts mid epigastric region 03/14. S/p left Pleur X  catheter 03/09  for recurrent pleural effusion and chylothorax. Left pleur x catheter drainage continues to decrease.  Left Pleur X to be drained every other day starting 03/26 as it will be drained today. PA/LAT CXR taken yesterday showed much improvement. On room air. Encourage incentive spirometer and flutter valve 3. GI-tolerating MCT diet. 4. Anemia-Last H and H stable at 9.1 and 28.4 . 5. Mild hyponatremia-sodium is 132 this am 6. [facility] to discharge patient later this afternoon. He has several friends to help him and check on him.  Donielle M ZimmermanPA-C 07/04/2018,8:27 AM [phone]  I have seen and examined Aaron Wang and agree with the above assessment  and plan.  [name] MD Beeper [phone] Office [phone] 07/04/2018 12:20 PM

## 2018-07-04 NOTE — Progress Notes (Addendum)
Pt demonstrated the proper way to to drain plurex. Amount drain 50cc noted in Epic per providers instructions.

## 2018-07-04 NOTE — Care Management (Addendum)
07/04/18 Pt to discharge back to hotel today.  Pt will have 20 pleurx containers at discharge.  CSW faxed completed catheter forms to [facility] and original given to CMA to mail out per protocol.   [facility] contact information provided on AVS.  Discharge medications are on medicaid preferred list with the exception of pain medication - attending group informed  07/03/18 Discharge plan per attending and pt has now reverted back to home without [place].  CM contacted [facility] and confirmed with company that pt can discharge home without [facility] and still receive needed pleurx catheters from company.  CM requested to simply fill out pleurx forms as usual and send back to [place] will follow up with pt directly and ship supplies to address on file.  CM confirmed both address and phone number is accurate in Epic.  Pt informed CM that he plans to independently care for pleurx catheter and dressing changes.  Bedside nursing staff have provided pt education on catheter and dressing changes  06/29/18 CM discussed case with [facility] Director -  CSW will work to place pt in SNF to inability to locate [facility] needed at discharge and the need for catheter management and required frequency for draining of catheter.  CM discussed discharge barriers with home discharge - CVTS acknowledge and agree to SNF as discharge plan. CSW will facilitate discharge to SNF.  CM discussed HH barrier with pt - pt is in agreement for SNF at discharge  Update:  [facility]  (Branch Declined) and [facility](unable to take Pleurx)  refused pt.  [facility] has also declined pt - stating needs are greater than HH., [facility] declined based on insurance.  [facility] declined due to plurex catheter.  [facility] declined pt. [facility] declined pt.  CM left VM for [facility].  Pt given choice of HH and DME per medicare.gov list.  Pt chose [facility] for DME - tentative referral accepted pending orders.  CM acknowledges consult placed this am for HH/DME including HHRN/tube  feeds and pluerx catheter.  CM actively working on finding an accepting agency with current insurance, current complexity and current living situation (pt will return to motel at discharge)  Outstanding barriers to discharge include; awaiting [place] orders, tube feed pumpl/equipment orders with nutritional specifics, accepting [facility] agency.

## 2018-07-06 ENCOUNTER — Telehealth: Payer: Self-pay | Admitting: Internal Medicine

## 2018-07-06 ENCOUNTER — Ambulatory Visit: Payer: Medicaid Other | Admitting: Cardiothoracic Surgery

## 2018-07-12 ENCOUNTER — Other Ambulatory Visit: Payer: Self-pay | Admitting: Cardiothoracic Surgery

## 2018-07-12 DIAGNOSIS — C159 Malignant neoplasm of esophagus, unspecified: Secondary | ICD-10-CM

## 2018-07-13 ENCOUNTER — Other Ambulatory Visit: Payer: Self-pay

## 2018-07-13 ENCOUNTER — Ambulatory Visit
Admission: RE | Admit: 2018-07-13 | Discharge: 2018-07-13 | Disposition: A | Discharge status: Home / Self Care | Payer: Medicaid Other | Source: Ambulatory Visit | Attending: Cardiothoracic Surgery | Admitting: Cardiothoracic Surgery

## 2018-07-13 ENCOUNTER — Ambulatory Visit (INDEPENDENT_AMBULATORY_CARE_PROVIDER_SITE_OTHER): Payer: Self-pay | Admitting: Cardiothoracic Surgery

## 2018-07-13 VITALS — BP 104/68 | HR 80 | Temp 97.0°F | Resp 20 | Ht 67.0 in | Wt 113.0 lb

## 2018-07-13 DIAGNOSIS — J90 Pleural effusion, not elsewhere classified: Secondary | ICD-10-CM

## 2018-07-13 DIAGNOSIS — C154 Malignant neoplasm of middle third of esophagus: Secondary | ICD-10-CM

## 2018-07-13 DIAGNOSIS — C159 Malignant neoplasm of esophagus, unspecified: Secondary | ICD-10-CM

## 2018-07-13 NOTE — Telephone Encounter (Signed)
[address]       [place] [address]             [phone]      Giancarlo L Heid McRoberts Medical Record #[id number] Date of Birth: 14-Dec-1955  Referring: No ref. provider found Primary Care: Nolene Ebbs, MD Primary Cardiologist: No primary care provider on file.   Chief Complaint:   POST OP FOLLOW UP  History of Present Illness:  05/29/2018 PREOPERATIVE DIAGNOSIS:  Squamous cell carcinoma of the esophagus. POSTOPERATIVE DIAGNOSIS:  Squamous cell carcinoma of the esophagus. SURGICAL PROCEDURE:  Bronchoscopy, transhiatal total esophagectomy with cervical esophagogastrostomy, pyloromyotomy, feeding jejunostomy tube, placement of left chest tube. SURGEON:  Lanelle Bal, MD     06/19/2018 PREOPERATIVE DIAGNOSIS:  Recurrent left pleural effusion, chylothorax. POSTOPERATIVE DIAGNOSIS:  Recurrent left pleural effusion, chylothorax. SURGICAL PROCEDURE:  Placement of left PleurX catheter with fluoroscopic and ultrasound guidance and drainage of left pleural effusion. SURGEON:  Lanelle Bal, MD  Patient ultimately was discharged home after developing respiratory distress following surgery, he also developed a left chylous effusion which ultimately resolved with placement of a Pleurx catheter and fenestration of the thoracic duct and abdomen.    Cancer Staging Primary cancer of middle third of esophagus ([place]) Staging form: Esophagus - Squamous Cell Carcinoma, AJCC 8th Edition - Clinical: Stage II (cT2, cN0, cM0, L: Middle) - Signed by Ladell Pier, MD on 03/23/2018 - Pathologic stage from 05/31/2018: Stage IIA (pT2, pN0, cM0, G2, L: Middle) - Signed by [name], MD on 05/31/2018    Past Medical History:  Diagnosis Date  . Back pain   . Carpal tunnel syndrome   . Esophageal cancer ([place])   . GERD (gastroesophageal reflux disease)   . Hypertension   . Injury    tore ligaments left knee     Social History   Tobacco Use  Smoking  Status Current Every Day Smoker  . Packs/day: 0.25  . Years: 7.00  . Pack years: 1.75  Smokeless Tobacco Never Used    Social History   Substance and Sexual Activity  Alcohol Use Yes  . Alcohol/week: 4.0 standard drinks  . Types: 4 Cans of beer per week     No Known Allergies  Current Outpatient Medications  Medication Sig Dispense Refill  . amLODipine (NORVASC) 10 MG tablet Take 1 tablet (10 mg total) by mouth daily. 30 tablet 1  . carvedilol (COREG) 12.5 MG tablet Take 1 tablet (12.5 mg total) by mouth 2 (two) times daily with a meal. 60 tablet 1  . cloNIDine (CATAPRES - DOSED IN MG/24 HR) 0.2 mg/24hr patch Place 1 patch (0.2 mg total) onto the skin once a week. 4 patch 10  . ibuprofen (ADVIL,MOTRIN) 800 MG tablet Take 1 tablet (800 mg total) by mouth 3 (three) times daily. (Patient taking differently: Take 800 mg by mouth every 8 (eight) hours as needed for mild pain. ) 21 tablet 0  . lisinopril (PRINIVIL,ZESTRIL) 20 MG tablet Take 1 tablet (20 mg total) by mouth daily. 30 tablet 1  . oxyCODONE (ROXICODONE) 5 MG immediate release tablet Take 1 tablet (5 mg total) by mouth every 6 (six) hours as needed for severe pain. 30 tablet 0  . Water For Irrigation, Sterile (FREE WATER) SOLN Flush J tube (big red tube) two times daily (morning and at night) with 10 mg 1000 mL 2   No current facility-administered medications for this visit.  Physical Exam:      Diagnostic Studies & Laboratory data:     Recent Radiology Findings:   No results found.    Recent Lab Findings: Lab Results  Component Value Date   WBC 4.4 07/04/2018   HGB 9.1 (L) 07/04/2018   HCT 28.4 (L) 07/04/2018   PLT 290 07/04/2018   GLUCOSE 104 (H) 07/04/2018   CHOL 94 06/11/2018   TRIG 55 06/11/2018   HDL 27 (L) 06/11/2018   LDLDIRECT 112 (H) 11/26/2009   LDLCALC 56 06/11/2018   ALT 16 06/22/2018   AST 16 06/22/2018   NA 132 (L) 07/04/2018   K 4.0 07/04/2018   CL 100 07/04/2018   CREATININE  0.74 07/04/2018   BUN 7 07/04/2018   CO2 26 07/04/2018   TSH 0.925 11/26/2009   INR 1.2 06/19/2018      Assessment / Plan:            [name] MD      [address] [place] [address] Office [phone]   Beeper [phone]  07/13/2018 12:22 PM

## 2018-07-13 NOTE — Progress Notes (Signed)
[address]       [place] [address]             [phone]      Elman L Czajkowski [facility] Medical Record #[id number] Date of Birth: 1955-05-11  Referring: No ref. provider found Primary Care: Nolene Ebbs, MD Primary Cardiologist: No primary care provider on file.   Chief Complaint:   POST OP FOLLOW UP  History of Present Illness:  05/29/2018 PREOPERATIVE DIAGNOSIS:  Squamous cell carcinoma of the esophagus. POSTOPERATIVE DIAGNOSIS:  Squamous cell carcinoma of the esophagus. SURGICAL PROCEDURE:  Bronchoscopy, transhiatal total esophagectomy with cervical esophagogastrostomy, pyloromyotomy, feeding jejunostomy tube, placement of left chest tube. SURGEON:  Lanelle Bal, MD     06/19/2018 PREOPERATIVE DIAGNOSIS:  Recurrent left pleural effusion, chylothorax. POSTOPERATIVE DIAGNOSIS:  Recurrent left pleural effusion, chylothorax. SURGICAL PROCEDURE:  Placement of left PleurX catheter with fluoroscopic and ultrasound guidance and drainage of left pleural effusion. SURGEON:  Lanelle Bal, MD  Patient ultimately was discharged home after developing respiratory distress following surgery, he also developed a left chylous effusion which ultimately resolved with placement of a Pleurx catheter and fenestration of the thoracic duct and abdomen.   Since discharge home the patient notes that he feels much better, taking regular food without difficulty, denies any fever chills or shortness of breath.,  He notes that he has been draining his Pleurx catheter on the left every other day no more than 75 cc has come out for 3 consecutive drainages.  Cancer Staging Primary cancer of middle third of esophagus ([place]) Staging form: Esophagus - Squamous Cell Carcinoma, AJCC 8th Edition - Clinical: Stage II (cT2, cN0, cM0, L: Middle) - Signed by Ladell Pier, MD on 03/23/2018 - Pathologic stage from 05/31/2018: Stage IIA (pT2, pN0, cM0, G2, L: Middle) - Signed by  [name], MD on 05/31/2018    Past Medical History:  Diagnosis Date  . Back pain   . Carpal tunnel syndrome   . Esophageal cancer ([place])   . GERD (gastroesophageal reflux disease)   . Hypertension   . Injury    tore ligaments left knee     Social History   Tobacco Use  Smoking Status Current Every Day Smoker  . Packs/day: 0.25  . Years: 7.00  . Pack years: 1.75  Smokeless Tobacco Never Used    Social History   Substance and Sexual Activity  Alcohol Use Yes  . Alcohol/week: 4.0 standard drinks  . Types: 4 Cans of beer per week     No Known Allergies  Current Outpatient Medications  Medication Sig Dispense Refill  . amLODipine (NORVASC) 10 MG tablet Take 1 tablet (10 mg total) by mouth daily. 30 tablet 1  . carvedilol (COREG) 12.5 MG tablet Take 1 tablet (12.5 mg total) by mouth 2 (two) times daily with a meal. 60 tablet 1  . cloNIDine (CATAPRES - DOSED IN MG/24 HR) 0.2 mg/24hr patch Place 1 patch (0.2 mg total) onto the skin once a week. 4 patch 10  . ibuprofen (ADVIL,MOTRIN) 800 MG tablet Take 1 tablet (800 mg total) by mouth 3 (three) times daily. (Patient taking differently: Take 800 mg by mouth every 8 (eight) hours as needed for mild pain. ) 21 tablet 0  . lisinopril (PRINIVIL,ZESTRIL) 20 MG tablet Take 1 tablet (20 mg total) by mouth daily. 30 tablet 1  . oxyCODONE (ROXICODONE) 5 MG immediate release tablet Take 1 tablet (5 mg total) by mouth every 6 (  six) hours as needed for severe pain. 30 tablet 0  . Water For Irrigation, Sterile (FREE WATER) SOLN Flush J tube (big red tube) two times daily (morning and at night) with 10 mg 1000 mL 2   No current facility-administered medications for this visit.        Physical Exam: BP 104/68   Pulse 80   Temp (!) 97 F (36.1 C) (Oral)   Resp 20   Ht 5\' 7"  (1.702 m)   Wt 113 lb (51.3 kg)   SpO2 95% Comment: RA  BMI 17.70 kg/m  General appearance: alert, cooperative and no distress Head: Normocephalic,  without obvious abnormality, atraumatic Neck: no adenopathy, no carotid bruit, no JVD, supple, symmetrical, trachea midline and thyroid not enlarged, symmetric, no tenderness/mass/nodules Lymph nodes: Cervical, supraclavicular, and axillary nodes normal. Resp: clear to auscultation bilaterally Back: symmetric, no curvature. ROM normal. No CVA tenderness. Cardio: regular rate and rhythm, S1, S2 normal, no murmur, click, rub or gallop GI: soft, non-tender; bowel sounds normal; no masses,  no organomegaly Extremities: extremities normal, atraumatic, no cyanosis or edema and Homans sign is negative, no sign of DVT Neurologic: Grossly normal Abdominal incision and left neck incision well-healed, J-tube is in place.  Diagnostic Studies & Laboratory data:     Recent Radiology Findings:   No results found.    Recent Lab Findings: Lab Results  Component Value Date   WBC 4.4 07/04/2018   HGB 9.1 (L) 07/04/2018   HCT 28.4 (L) 07/04/2018   PLT 290 07/04/2018   GLUCOSE 104 (H) 07/04/2018   CHOL 94 06/11/2018   TRIG 55 06/11/2018   HDL 27 (L) 06/11/2018   LDLDIRECT 112 (H) 11/26/2009   LDLCALC 56 06/11/2018   ALT 16 06/22/2018   AST 16 06/22/2018   NA 132 (L) 07/04/2018   K 4.0 07/04/2018   CL 100 07/04/2018   CREATININE 0.74 07/04/2018   BUN 7 07/04/2018   CO2 26 07/04/2018   TSH 0.925 11/26/2009   INR 1.2 06/19/2018      Assessment / Plan:   Patient doing remarkably well since discharge especially considering his home living conditions and his prolonged postoperative course, the chest x-ray today is totally clear of any evidence of effusion.   The left Pleurx catheter after cleaning with Betadine sterilely and infiltrating with local lidocaine was removed without difficulty in its entirety.-Patient was instructed in local wound care for the exit site which has been dressed with Vaseline gauze and dressing to stay in place for 2 days.  Patient will continue on his current diet   Plan to see him back in 3 to 4 weeks       [name] MD      [address] [place] [address] Office [phone]   Beeper [phone]  07/13/2018 12:22 PM

## 2018-07-17 ENCOUNTER — Ambulatory Visit: Payer: Self-pay

## 2018-07-18 ENCOUNTER — Ambulatory Visit (INDEPENDENT_AMBULATORY_CARE_PROVIDER_SITE_OTHER): Payer: Self-pay | Admitting: *Deleted

## 2018-07-18 ENCOUNTER — Other Ambulatory Visit: Payer: Self-pay

## 2018-07-18 ENCOUNTER — Telehealth: Payer: Self-pay | Admitting: Oncology

## 2018-07-18 VITALS — Temp 97.1°F

## 2018-07-18 DIAGNOSIS — Z4659 Encounter for fitting and adjustment of other gastrointestinal appliance and device: Secondary | ICD-10-CM

## 2018-07-18 DIAGNOSIS — Z4803 Encounter for change or removal of drains: Secondary | ICD-10-CM

## 2018-07-18 DIAGNOSIS — C154 Malignant neoplasm of middle third of esophagus: Secondary | ICD-10-CM

## 2018-07-18 NOTE — Progress Notes (Signed)
Aaron Wang called yesterday with c/o pain at his red rubber catheter feeding tube site.  He is tolerating a soft diet very well and has bee for some time.  He reported this to Dr. Servando Snare at his very recent last visit.  He wants it taken out due to the discomfort.  I discussed this with Dr. Servando Snare and he ordered it to be removed.  Aaron Wang came in today. I cut the two sutures that secured the tube and was easily removed.  I cleaned the area with CarraKlenz and applied a small dressing.  He can remove this dressing in a couple of days and shower.   He will return as scheduled.  Dr. Servando Snare observed the procedure.

## 2018-07-18 NOTE — Telephone Encounter (Signed)
Scheduled appt per 4/3 sch message - unable to reach patient or leave vmail. Sent reminder letter in the mail with appt date and time

## 2018-07-19 ENCOUNTER — Emergency Department (HOSPITAL_COMMUNITY): Payer: Medicaid Other

## 2018-07-19 ENCOUNTER — Observation Stay (HOSPITAL_COMMUNITY)
Admission: EM | Admit: 2018-07-19 | Discharge: 2018-07-20 | Disposition: A | Discharge status: Home / Self Care | Payer: Medicaid Other | Attending: Nephrology | Admitting: Nephrology

## 2018-07-19 ENCOUNTER — Other Ambulatory Visit: Payer: Self-pay

## 2018-07-19 ENCOUNTER — Encounter (HOSPITAL_COMMUNITY): Payer: Self-pay | Admitting: Family Medicine

## 2018-07-19 DIAGNOSIS — D649 Anemia, unspecified: Secondary | ICD-10-CM | POA: Diagnosis not present

## 2018-07-19 DIAGNOSIS — I10 Essential (primary) hypertension: Secondary | ICD-10-CM | POA: Insufficient documentation

## 2018-07-19 DIAGNOSIS — S0990XA Unspecified injury of head, initial encounter: Secondary | ICD-10-CM | POA: Diagnosis not present

## 2018-07-19 DIAGNOSIS — F17200 Nicotine dependence, unspecified, uncomplicated: Secondary | ICD-10-CM | POA: Insufficient documentation

## 2018-07-19 DIAGNOSIS — Y939 Activity, unspecified: Secondary | ICD-10-CM | POA: Insufficient documentation

## 2018-07-19 DIAGNOSIS — Z79899 Other long term (current) drug therapy: Secondary | ICD-10-CM | POA: Diagnosis not present

## 2018-07-19 DIAGNOSIS — C154 Malignant neoplasm of middle third of esophagus: Secondary | ICD-10-CM

## 2018-07-19 DIAGNOSIS — Y92008 Other place in unspecified non-institutional (private) residence as the place of occurrence of the external cause: Secondary | ICD-10-CM | POA: Insufficient documentation

## 2018-07-19 DIAGNOSIS — Y999 Unspecified external cause status: Secondary | ICD-10-CM | POA: Insufficient documentation

## 2018-07-19 DIAGNOSIS — S0291XA Unspecified fracture of skull, initial encounter for closed fracture: Secondary | ICD-10-CM | POA: Insufficient documentation

## 2018-07-19 DIAGNOSIS — Z8501 Personal history of malignant neoplasm of esophagus: Secondary | ICD-10-CM

## 2018-07-19 DIAGNOSIS — S065X9A Traumatic subdural hemorrhage with loss of consciousness of unspecified duration, initial encounter: Principal | ICD-10-CM | POA: Insufficient documentation

## 2018-07-19 DIAGNOSIS — S06339A Contusion and laceration of cerebrum, unspecified, with loss of consciousness of unspecified duration, initial encounter: Secondary | ICD-10-CM | POA: Insufficient documentation

## 2018-07-19 DIAGNOSIS — S062X0A Diffuse traumatic brain injury without loss of consciousness, initial encounter: Secondary | ICD-10-CM

## 2018-07-19 DIAGNOSIS — C159 Malignant neoplasm of esophagus, unspecified: Secondary | ICD-10-CM | POA: Diagnosis present

## 2018-07-19 DIAGNOSIS — J90 Pleural effusion, not elsewhere classified: Secondary | ICD-10-CM | POA: Insufficient documentation

## 2018-07-19 DIAGNOSIS — S065XAA Traumatic subdural hemorrhage with loss of consciousness status unknown, initial encounter: Secondary | ICD-10-CM

## 2018-07-19 DIAGNOSIS — I609 Nontraumatic subarachnoid hemorrhage, unspecified: Secondary | ICD-10-CM

## 2018-07-19 LAB — ETHANOL: Alcohol, Ethyl (B): 126 mg/dL — ABNORMAL HIGH (ref <10)

## 2018-07-19 LAB — CBC WITH DIFFERENTIAL/PLATELET
Abs Immature Granulocytes: 0.01 10*3/uL (ref 0.00–0.07)
Basophils Absolute: 0.1 10*3/uL (ref 0.0–0.1)
Basophils Relative: 1 %
Eosinophils Absolute: 0 10*3/uL (ref 0.0–0.5)
Eosinophils Relative: 0 %
HCT: 31.5 % — ABNORMAL LOW (ref 39.0–52.0)
Hemoglobin: 9.9 g/dL — ABNORMAL LOW (ref 13.0–17.0)
Immature Granulocytes: 0 %
Lymphocytes Relative: 9 %
Lymphs Abs: 0.7 10*3/uL (ref 0.7–4.0)
MCH: 28.5 pg (ref 26.0–34.0)
MCHC: 31.4 g/dL (ref 30.0–36.0)
MCV: 90.8 fL (ref 80.0–100.0)
Monocytes Absolute: 0.3 10*3/uL (ref 0.1–1.0)
Monocytes Relative: 4 %
Neutro Abs: 6.9 10*3/uL (ref 1.7–7.7)
Neutrophils Relative %: 86 %
Platelets: 246 10*3/uL (ref 150–400)
RBC: 3.47 MIL/uL — ABNORMAL LOW (ref 4.22–5.81)
RDW: 14.9 % (ref 11.5–15.5)
WBC: 8 10*3/uL (ref 4.0–10.5)
nRBC: 0 % (ref 0.0–0.2)

## 2018-07-19 LAB — BASIC METABOLIC PANEL
Anion gap: 11 (ref 5–15)
BUN: 9 mg/dL (ref 6–20)
CO2: 19 mmol/L — ABNORMAL LOW (ref 22–32)
Calcium: 8.5 mg/dL — ABNORMAL LOW (ref 8.9–10.3)
Chloride: 110 mmol/L (ref 98–111)
Creatinine, Ser: 0.89 mg/dL (ref 0.61–1.24)
GFR calc Af Amer: >60 mL/min (ref >60)
GFR calc non Af Amer: >60 mL/min (ref >60)
Glucose, Bld: 74 mg/dL (ref 70–99)
Potassium: 4.2 mmol/L (ref 3.5–5.1)
Sodium: 140 mmol/L (ref 135–145)

## 2018-07-19 MED ORDER — POLYETHYLENE GLYCOL 3350 17 G PO PACK: 17.0000 g | PACK | Freq: Every day | ORAL | Status: DC | PRN

## 2018-07-19 MED ORDER — LORAZEPAM 1 MG PO TABS: 0.0000 mg | ORAL_TABLET | Freq: Four times a day (QID) | ORAL | Status: DC

## 2018-07-19 MED ORDER — BISACODYL 5 MG PO TBEC: 5.0000 mg | DELAYED_RELEASE_TABLET | Freq: Every day | ORAL | Status: DC | PRN

## 2018-07-19 MED ORDER — ONDANSETRON HCL 4 MG PO TABS: 4.0000 mg | ORAL_TABLET | Freq: Four times a day (QID) | ORAL | Status: DC | PRN

## 2018-07-19 MED ORDER — FENTANYL CITRATE (PF) 100 MCG/2ML IJ SOLN
25.0000 ug | INTRAMUSCULAR | Status: DC | PRN
  Administered 2018-07-20 (×2): 50 ug via INTRAVENOUS
  Filled 2018-07-19 (×2): qty 2

## 2018-07-19 MED ORDER — VITAMIN B-1 100 MG PO TABS
100.0000 mg | ORAL_TABLET | Freq: Every day | ORAL | Status: DC
  Administered 2018-07-20: 10:00:00 100 mg via ORAL
  Filled 2018-07-19: qty 1

## 2018-07-19 MED ORDER — CARVEDILOL 12.5 MG PO TABS
12.5000 mg | ORAL_TABLET | Freq: Two times a day (BID) | ORAL | Status: DC
  Administered 2018-07-20 (×2): 12.5 mg via ORAL
  Filled 2018-07-19 (×2): qty 1

## 2018-07-19 MED ORDER — FOLIC ACID 1 MG PO TABS
1.0000 mg | ORAL_TABLET | Freq: Every day | ORAL | Status: DC
  Administered 2018-07-20: 1 mg via ORAL
  Filled 2018-07-19: qty 1

## 2018-07-19 MED ORDER — ADULT MULTIVITAMIN W/MINERALS CH
1.0000 | ORAL_TABLET | Freq: Every day | ORAL | Status: DC
  Administered 2018-07-20: 10:00:00 1 via ORAL
  Filled 2018-07-19: qty 1

## 2018-07-19 MED ORDER — ACETAMINOPHEN 325 MG PO TABS: 650.0000 mg | ORAL_TABLET | Freq: Four times a day (QID) | ORAL | Status: DC | PRN

## 2018-07-19 MED ORDER — LISINOPRIL 20 MG PO TABS
20.0000 mg | ORAL_TABLET | Freq: Every day | ORAL | Status: DC
  Administered 2018-07-20: 10:00:00 20 mg via ORAL
  Filled 2018-07-19: qty 1

## 2018-07-19 MED ORDER — LORAZEPAM 1 MG PO TABS: 0.0000 mg | ORAL_TABLET | Freq: Two times a day (BID) | ORAL | Status: DC

## 2018-07-19 MED ORDER — SODIUM CHLORIDE 0.9 % IV SOLN
INTRAVENOUS | Status: DC
  Administered 2018-07-20: 01:00:00 via INTRAVENOUS

## 2018-07-19 MED ORDER — SODIUM CHLORIDE 0.9% FLUSH
3.0000 mL | Freq: Two times a day (BID) | INTRAVENOUS | Status: DC
  Administered 2018-07-20 (×2): 3 mL via INTRAVENOUS

## 2018-07-19 MED ORDER — FLEET ENEMA 7-19 GM/118ML RE ENEM: 1.0000 | ENEMA | Freq: Once | RECTAL | Status: DC | PRN

## 2018-07-19 MED ORDER — ONDANSETRON HCL 4 MG/2ML IJ SOLN: 4.0000 mg | Freq: Four times a day (QID) | INTRAMUSCULAR | Status: DC | PRN

## 2018-07-19 MED ORDER — ACETAMINOPHEN 650 MG RE SUPP: 650.0000 mg | Freq: Four times a day (QID) | RECTAL | Status: DC | PRN

## 2018-07-19 MED ORDER — THIAMINE HCL 100 MG/ML IJ SOLN: 100.0000 mg | Freq: Every day | INTRAMUSCULAR | Status: DC

## 2018-07-19 MED ORDER — DOCUSATE SODIUM 100 MG PO CAPS
100.0000 mg | ORAL_CAPSULE | Freq: Two times a day (BID) | ORAL | Status: DC
  Administered 2018-07-20: 10:00:00 100 mg via ORAL
  Filled 2018-07-19: qty 1

## 2018-07-19 MED ORDER — LABETALOL HCL 5 MG/ML IV SOLN: 10.0000 mg | INTRAVENOUS | Status: DC | PRN

## 2018-07-19 MED ORDER — LORAZEPAM 2 MG/ML IJ SOLN: 1.0000 mg | Freq: Four times a day (QID) | INTRAMUSCULAR | Status: DC | PRN

## 2018-07-19 MED ORDER — FENTANYL CITRATE (PF) 100 MCG/2ML IJ SOLN
50.0000 ug | Freq: Once | INTRAMUSCULAR | Status: AC
  Administered 2018-07-19: 22:00:00 50 ug via INTRAVENOUS
  Filled 2018-07-19: qty 2

## 2018-07-19 MED ORDER — AMLODIPINE BESYLATE 10 MG PO TABS
10.0000 mg | ORAL_TABLET | Freq: Every day | ORAL | Status: DC
  Administered 2018-07-20: 10 mg via ORAL
  Filled 2018-07-19: qty 1

## 2018-07-19 MED ORDER — LORAZEPAM 1 MG PO TABS: 1.0000 mg | ORAL_TABLET | Freq: Four times a day (QID) | ORAL | Status: DC | PRN
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
## 2018-07-19 NOTE — ED Notes (Signed)
Patient transported to CT 
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
## 2018-07-20 ENCOUNTER — Observation Stay (HOSPITAL_COMMUNITY): Payer: Medicaid Other

## 2018-07-20 DIAGNOSIS — I609 Nontraumatic subarachnoid hemorrhage, unspecified: Secondary | ICD-10-CM | POA: Diagnosis not present

## 2018-07-20 DIAGNOSIS — S062X0A Diffuse traumatic brain injury without loss of consciousness, initial encounter: Secondary | ICD-10-CM

## 2018-07-20 DIAGNOSIS — S065X9A Traumatic subdural hemorrhage with loss of consciousness of unspecified duration, initial encounter: Secondary | ICD-10-CM | POA: Diagnosis not present

## 2018-07-20 DIAGNOSIS — Z8501 Personal history of malignant neoplasm of esophagus: Secondary | ICD-10-CM

## 2018-07-20 DIAGNOSIS — S0990XA Unspecified injury of head, initial encounter: Secondary | ICD-10-CM | POA: Diagnosis not present

## 2018-07-20 DIAGNOSIS — S065XAA Traumatic subdural hemorrhage with loss of consciousness status unknown, initial encounter: Secondary | ICD-10-CM

## 2018-07-20 LAB — BASIC METABOLIC PANEL
Anion gap: 20 — ABNORMAL HIGH (ref 5–15)
BUN: 7 mg/dL (ref 6–20)
CO2: 12 mmol/L — ABNORMAL LOW (ref 22–32)
Calcium: 9 mg/dL (ref 8.9–10.3)
Chloride: 108 mmol/L (ref 98–111)
Creatinine, Ser: 0.81 mg/dL (ref 0.61–1.24)
GFR calc Af Amer: >60 mL/min (ref >60)
GFR calc non Af Amer: >60 mL/min (ref >60)
Glucose, Bld: 87 mg/dL (ref 70–99)
Potassium: 4.3 mmol/L (ref 3.5–5.1)
Sodium: 140 mmol/L (ref 135–145)

## 2018-07-20 LAB — GLUCOSE, CAPILLARY: Glucose-Capillary: 74 mg/dL (ref 70–99)

## 2018-07-20 LAB — CBC
HCT: 30.5 % — ABNORMAL LOW (ref 39.0–52.0)
Hemoglobin: 10 g/dL — ABNORMAL LOW (ref 13.0–17.0)
MCH: 29.3 pg (ref 26.0–34.0)
MCHC: 32.8 g/dL (ref 30.0–36.0)
MCV: 89.4 fL (ref 80.0–100.0)
Platelets: 238 10*3/uL (ref 150–400)
RBC: 3.41 MIL/uL — ABNORMAL LOW (ref 4.22–5.81)
RDW: 14.8 % (ref 11.5–15.5)
WBC: 7.8 10*3/uL (ref 4.0–10.5)
nRBC: 0 % (ref 0.0–0.2)

## 2018-07-20 LAB — HIV ANTIBODY (ROUTINE TESTING W REFLEX): HIV Screen 4th Generation wRfx: NONREACTIVE

## 2018-07-20 MED ORDER — OXYCODONE HCL 5 MG PO TABS: 5.0000 mg | ORAL_TABLET | ORAL | 0 refills | Status: AC | PRN

## 2018-07-20 MED ORDER — OXYCODONE HCL 5 MG PO TABS: 5.0000 mg | ORAL_TABLET | ORAL | Status: DC | PRN

## 2018-07-20 MED FILL — oxyCODONE HCL 5 MG TABS: 5 | 5 days supply | Qty: 30 | Fill #0
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
## 2018-07-20 NOTE — Progress Notes (Signed)
Pt arrived to unit from [facility] ED. Pt pivoted from chair to bed without difficulty.  C/o pain 9/10. PRN pain med administered. Pt welcomed and oriented to unit. Will continue to monitor.
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
## 2018-07-20 NOTE — TOC Transition Note (Signed)
Transition of Care [facility]) - CM/SW Discharge Note   Patient Details  Name: Aaron Wang MRN: [id number] Date of Birth: [birth year]  [facility] CM/SW Contact:  Pollie Friar, RN Phone Number: 07/20/2018, 3:13 PM   Clinical Narrative:    Pt discharging back to his motel. [place] pharmacy to deliver meds to the room. CM provided bedside RN with cab voucher for transport home.  Walker to be delivered to the room.   Final next level of care: [place] Barriers to Discharge: No Barriers Identified   Patient Goals and CMS Choice Patient states their goals for this hospitalization and ongoing recovery are:: decreased pain in head CMS Medicare.gov Compare Post Acute Care list provided to:: Patient Choice offered to / list presented to : Patient  Discharge Placement                       Discharge Plan and Services     Post Acute Care Choice: Durable Medical Equipment, Home Health          DME Arranged: Walker rolling DME Agency: [facility] HH Arranged: PT [place] Agency: [place] ([facility])--Donna with [facility] accepted the referral   Social Determinants of Health (SDOH) Interventions     Readmission Risk Interventions No flowsheet data found.

## 2018-07-20 NOTE — Progress Notes (Signed)
Pt is being transported home with home health. Nurse went over all discharge paperwork with patient. All questions and concerns addressed. IV and tele removed. All belongings sent with patient. Patient to be taken down in wheelchair. [place]
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
## 2018-07-26 LAB — FUNGAL ORGANISM REFLEX

## 2018-07-26 LAB — FUNGUS CULTURE WITH STAIN

## 2018-07-26 LAB — FUNGUS CULTURE RESULT

## 2018-08-01 LAB — ACID FAST CULTURE WITH REFLEXED SENSITIVITIES (MYCOBACTERIA): Acid Fast Culture: NEGATIVE

## 2018-08-10 ENCOUNTER — Telehealth: Payer: Self-pay | Admitting: Cardiothoracic Surgery

## 2018-08-10 NOTE — Progress Notes (Unsigned)
3 week f/u.
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
## 2018-08-17 ENCOUNTER — Telehealth: Payer: Self-pay | Admitting: Cardiothoracic Surgery

## 2018-08-17 ENCOUNTER — Other Ambulatory Visit: Payer: Self-pay

## 2018-08-22 ENCOUNTER — Telehealth: Payer: Self-pay | Admitting: Oncology

## 2018-08-22 ENCOUNTER — Inpatient Hospital Stay: Payer: Medicaid Other | Attending: Oncology | Admitting: Oncology

## 2018-08-22 DIAGNOSIS — C154 Malignant neoplasm of middle third of esophagus: Secondary | ICD-10-CM | POA: Diagnosis not present
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
## 2018-08-22 NOTE — Telephone Encounter (Signed)
Scheduled appt per 5/12 los. ° °A calendar will be mailed out. °

## 2018-11-15 ENCOUNTER — Other Ambulatory Visit: Payer: Self-pay

## 2018-11-15 DIAGNOSIS — Z20822 Contact with and (suspected) exposure to covid-19: Secondary | ICD-10-CM

## 2018-11-16 LAB — NOVEL CORONAVIRUS, NAA: SARS-CoV-2, NAA: NOT DETECTED

## 2018-11-20 ENCOUNTER — Telehealth: Payer: Self-pay | Admitting: General Practice

## 2018-11-20 NOTE — Telephone Encounter (Signed)
Negative COVID results given. Patient results "NOT Detected." Caller expressed understanding. ° °

## 2018-11-22 ENCOUNTER — Other Ambulatory Visit: Payer: Self-pay

## 2018-11-22 ENCOUNTER — Telehealth: Payer: Self-pay | Admitting: Oncology

## 2018-11-22 ENCOUNTER — Telehealth: Payer: Self-pay

## 2018-11-22 ENCOUNTER — Inpatient Hospital Stay: Payer: Medicaid Other | Attending: Oncology | Admitting: Nurse Practitioner

## 2018-11-22 ENCOUNTER — Encounter: Payer: Self-pay | Admitting: Nurse Practitioner

## 2018-11-22 VITALS — BP 158/98 | HR 87 | Temp 98.3°F | Resp 17 | Ht 67.0 in | Wt 113.3 lb

## 2018-11-22 DIAGNOSIS — C154 Malignant neoplasm of middle third of esophagus: Secondary | ICD-10-CM

## 2018-11-22 DIAGNOSIS — I10 Essential (primary) hypertension: Secondary | ICD-10-CM | POA: Insufficient documentation

## 2018-11-22 DIAGNOSIS — R1310 Dysphagia, unspecified: Secondary | ICD-10-CM | POA: Diagnosis not present

## 2018-11-22 DIAGNOSIS — Z8501 Personal history of malignant neoplasm of esophagus: Secondary | ICD-10-CM | POA: Insufficient documentation

## 2018-11-22 NOTE — Telephone Encounter (Signed)
Scheduled per 08/12 los, patient has been called and notified.  °
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
## 2018-11-22 NOTE — Telephone Encounter (Signed)
Left msg on Dr Servando Snare nurse vm to contact pt in regards to follow-up appointment. Pt missed virtual visit and needed to reschedule appointment.  Pt aware that vm msg was left.

## 2018-11-23 ENCOUNTER — Other Ambulatory Visit: Payer: Self-pay

## 2018-11-23 DIAGNOSIS — J90 Pleural effusion, not elsewhere classified: Secondary | ICD-10-CM

## 2018-11-23 DIAGNOSIS — C159 Malignant neoplasm of esophagus, unspecified: Secondary | ICD-10-CM

## 2018-11-27 ENCOUNTER — Telehealth: Payer: Self-pay | Admitting: *Deleted

## 2018-11-27 NOTE — Telephone Encounter (Signed)
Pt called in and was given his COVID-19 test result as negative.

## 2018-11-29 ENCOUNTER — Encounter: Payer: Self-pay | Admitting: Gastroenterology

## 2018-12-14 ENCOUNTER — Ambulatory Visit
Admission: RE | Admit: 2018-12-14 | Discharge: 2018-12-14 | Disposition: A | Discharge status: Home / Self Care | Payer: Medicaid Other | Source: Ambulatory Visit | Attending: Cardiothoracic Surgery | Admitting: Cardiothoracic Surgery

## 2018-12-14 ENCOUNTER — Other Ambulatory Visit: Payer: Self-pay

## 2018-12-14 ENCOUNTER — Encounter: Payer: Self-pay | Admitting: Cardiothoracic Surgery

## 2018-12-14 ENCOUNTER — Ambulatory Visit: Payer: Medicaid Other | Admitting: Cardiothoracic Surgery

## 2018-12-14 VITALS — BP 202/98 | HR 65 | Temp 97.7°F | Resp 16 | Ht 67.0 in | Wt 118.0 lb

## 2018-12-14 DIAGNOSIS — C154 Malignant neoplasm of middle third of esophagus: Secondary | ICD-10-CM

## 2018-12-14 DIAGNOSIS — Z09 Encounter for follow-up examination after completed treatment for conditions other than malignant neoplasm: Secondary | ICD-10-CM | POA: Diagnosis not present

## 2018-12-14 DIAGNOSIS — J90 Pleural effusion, not elsewhere classified: Secondary | ICD-10-CM

## 2018-12-14 DIAGNOSIS — C159 Malignant neoplasm of esophagus, unspecified: Secondary | ICD-10-CM

## 2018-12-14 NOTE — Progress Notes (Signed)
[address]       [place] [address]             [phone]     Nate L Wolfer [place] Medical Record [id number] Date of Birth: Jul 27, 1955  Referring: No ref. provider found Primary Care: Nolene Ebbs, MD Primary Cardiologist: No primary care provider on file.   Chief Complaint:   POST OP FOLLOW UP    History of Present Illness:  05/29/2018 PREOPERATIVE DIAGNOSIS:  Squamous cell carcinoma of the esophagus. POSTOPERATIVE DIAGNOSIS:  Squamous cell carcinoma of the esophagus. SURGICAL PROCEDURE:  Bronchoscopy, transhiatal total esophagectomy with cervical esophagogastrostomy, pyloromyotomy, feeding jejunostomy tube, placement of left chest tube. SURGEON:  Lanelle Bal, MD     06/19/2018 PREOPERATIVE DIAGNOSIS:  Recurrent left pleural effusion, chylothorax. POSTOPERATIVE DIAGNOSIS:  Recurrent left pleural effusion, chylothorax. SURGICAL PROCEDURE:  Placement of left PleurX catheter with fluoroscopic and ultrasound guidance and drainage of left pleural effusion. SURGEON:  Lanelle Bal, MD  Cancer Staging Primary cancer of middle third of esophagus [facility]) Staging form: Esophagus - Squamous Cell Carcinoma, AJCC 8th Edition - Clinical: Stage II (cT2, cN0, cM0, L: Middle) - Signed by Ladell Pier, MD on 03/23/2018 - Pathologic stage from 05/31/2018: Stage IIA (pT2, pN0, cM0, G2, L: Middle) - Signed by [name], MD on 05/31/2018   Patient returns to the office today in follow-up after transhiatal total esophagectomy for pathologic stage II a squamous cell carcinoma of the midesophagus.  Since last seen the patient has gained several pounds.  He is taking a p.o. diet reasonably well.  He comes in today with his blood pressure significantly elevated, during his hospitalization for his esophagectomy had significant problems with hypertension.  It is unclear from his discussion today if he is actually taking his blood pressure medication on a  regular basis.    Past Medical History:  Diagnosis Date  . Back pain   . Carpal tunnel syndrome   . Esophageal cancer ([place])   . GERD (gastroesophageal reflux disease)   . Hypertension   . Injury    tore ligaments left knee     Social History   Tobacco Use  Smoking Status Current Every Day Smoker  . Packs/day: 0.25  . Years: 7.00  . Pack years: 1.75  Smokeless Tobacco Never Used    Social History   Substance and Sexual Activity  Alcohol Use Yes  . Alcohol/week: 4.0 standard drinks  . Types: 4 Cans of beer per week     No Known Allergies  Current Outpatient Medications  Medication Sig Dispense Refill  . amLODipine (NORVASC) 10 MG tablet Take 1 tablet (10 mg total) by mouth daily. 30 tablet 1  . carvedilol (COREG) 12.5 MG tablet Take 1 tablet (12.5 mg total) by mouth 2 (two) times daily with a meal. 60 tablet 1  . cloNIDine (CATAPRES - DOSED IN MG/24 HR) 0.2 mg/24hr patch Place 1 patch (0.2 mg total) onto the skin once a week. 4 patch 10  . ibuprofen (ADVIL,MOTRIN) 800 MG tablet Take 1 tablet (800 mg total) by mouth 3 (three) times daily. (Patient taking differently: Take 800 mg by mouth every 8 (eight) hours as needed for mild pain. ) 21 tablet 0  . lisinopril (PRINIVIL,ZESTRIL) 20 MG tablet Take 1 tablet (20 mg total) by mouth daily. 30 tablet 1  . oxyCODONE (ROXICODONE) 5 MG immediate release tablet Take 1 tablet (5 mg total) by mouth every 6 (six) hours as  needed for severe pain. 30 tablet 0  . Water For Irrigation, Sterile (FREE WATER) SOLN Flush J tube (big red tube) two times daily (morning and at night) with 10 mg 1000 mL 2   No current facility-administered medications for this visit.        Physical Exam: BP (!) 202/98 (BP Location: Right Arm, Patient Position: Sitting, Cuff Size: Normal)   Pulse 65   Temp 97.7 F (36.5 C)   Resp 16   Ht 5\' 7"  (1.702 m)   Wt 118 lb (53.5 kg)   SpO2 96% Comment: RA  BMI 18.48 kg/m   General appearance: alert,  cooperative and no distress Neck: no adenopathy, no carotid bruit, no JVD, supple, symmetrical, trachea midline and thyroid not enlarged, symmetric, no tenderness/mass/nodules Lymph nodes: Cervical, supraclavicular, and axillary nodes normal. Resp: clear to auscultation bilaterally Cardio: regular rate and rhythm, S1, S2 normal, no murmur, click, rub or gallop GI: soft, non-tender; bowel sounds normal; no masses,  no organomegaly Extremities: extremities normal, atraumatic, no cyanosis or edema and Homans sign is negative, no sign of DVT Neurologic: Grossly normal Left neck incision and abdominal incisions are well-healed   Diagnostic Studies & Laboratory data:     Recent Radiology Findings:  Dg Chest 2 View  Result Date: 12/14/2018 CLINICAL DATA:  History of pleural effusion, esophageal cancer EXAM: CHEST - 2 VIEW COMPARISON:  07/13/2018 FINDINGS: Interval removal of a left-sided pleural drainage catheter. The heart size and mediastinal contours are within normal limits. Both lungs are clear. No pleural effusion. No pneumothorax. The visualized skeletal structures are unremarkable. IMPRESSION: No pleural effusion or pneumothorax status post pleural drainage catheter removal. Electronically Signed   By: Davina Poke M.D.   On: 12/14/2018 08:27   I have independently reviewed the above radiology studies  and reviewed the findings with the patient.     Recent Lab Findings: Lab Results  Component Value Date   WBC 4.4 07/04/2018   HGB 9.1 (L) 07/04/2018   HCT 28.4 (L) 07/04/2018   PLT 290 07/04/2018   GLUCOSE 104 (H) 07/04/2018   CHOL 94 06/11/2018   TRIG 55 06/11/2018   HDL 27 (L) 06/11/2018   LDLDIRECT 112 (H) 11/26/2009   LDLCALC 56 06/11/2018   ALT 16 06/22/2018   AST 16 06/22/2018   NA 132 (L) 07/04/2018   K 4.0 07/04/2018   CL 100 07/04/2018   CREATININE 0.74 07/04/2018   BUN 7 07/04/2018   CO2 26 07/04/2018   TSH 0.925 11/26/2009   INR 1.2 06/19/2018       Assessment / Plan:   By history, physical exam the patient appears to be in remission from his adenocarcinoma of the esophagus resected in February 2020 He appears to be noncompliant with his blood pressure control, his primary care office was kind enough to see him today to work on long-term strategy for blood pressure control.  He was strongly encouraged to stay engaged with blood pressure control.  He has a follow-up appointment in medical oncology in 2 months, I will plan to see him back in 6 months    [name] MD      [address] [place] [address] Office [phone]   Beeper [phone]  07/13/2018 12:22 PM

## 2019-01-11 DEATH — deceased

## 2019-02-22 ENCOUNTER — Inpatient Hospital Stay: Payer: Self-pay | Attending: Oncology | Admitting: Oncology

## 2019-02-22 ENCOUNTER — Telehealth: Payer: Self-pay | Admitting: Oncology

## 2019-02-22 NOTE — Telephone Encounter (Signed)
R/s appt per 11/12 sch message- unable to reach pt . And unable to leave message. Sent reminder letter with appt date an time

## 2019-03-20 ENCOUNTER — Telehealth: Payer: Self-pay | Admitting: Oncology

## 2019-03-20 NOTE — Telephone Encounter (Signed)
Called patient per providers request to convert 12/11 appointment to a virtual visit, patient dos not have a voicemail set up.

## 2019-03-23 ENCOUNTER — Inpatient Hospital Stay: Payer: Self-pay | Attending: Oncology | Admitting: Oncology

## 2019-03-26 ENCOUNTER — Telehealth: Payer: Self-pay | Admitting: Oncology

## 2019-03-26 NOTE — Telephone Encounter (Signed)
R/s appt per 12/11 sch message - called pt - no answer and no vmail . Mailed reminder letter with appt date and time

## 2019-05-01 ENCOUNTER — Ambulatory Visit: Payer: Self-pay | Admitting: Oncology

## 2019-06-21 ENCOUNTER — Ambulatory Visit: Payer: Medicaid Other | Admitting: Cardiothoracic Surgery

## 2019-07-11 ENCOUNTER — Other Ambulatory Visit: Payer: Self-pay | Admitting: Cardiothoracic Surgery

## 2019-07-11 DIAGNOSIS — C159 Malignant neoplasm of esophagus, unspecified: Secondary | ICD-10-CM

## 2019-07-12 ENCOUNTER — Ambulatory Visit: Payer: Self-pay | Admitting: Cardiothoracic Surgery

## 2019-07-12 NOTE — Progress Notes (Deleted)
[address]       [place] [address]             [phone]     Nate L Wolfer [place] Medical Record [id number] Date of Birth: Jul 27, 1955  Referring: No ref. provider found Primary Care: Nolene Ebbs, MD Primary Cardiologist: No primary care provider on file.   Chief Complaint:   POST OP FOLLOW UP    History of Present Illness:  05/29/2018 PREOPERATIVE DIAGNOSIS:  Squamous cell carcinoma of the esophagus. POSTOPERATIVE DIAGNOSIS:  Squamous cell carcinoma of the esophagus. SURGICAL PROCEDURE:  Bronchoscopy, transhiatal total esophagectomy with cervical esophagogastrostomy, pyloromyotomy, feeding jejunostomy tube, placement of left chest tube. SURGEON:  Lanelle Bal, MD     06/19/2018 PREOPERATIVE DIAGNOSIS:  Recurrent left pleural effusion, chylothorax. POSTOPERATIVE DIAGNOSIS:  Recurrent left pleural effusion, chylothorax. SURGICAL PROCEDURE:  Placement of left PleurX catheter with fluoroscopic and ultrasound guidance and drainage of left pleural effusion. SURGEON:  Lanelle Bal, MD  Cancer Staging Primary cancer of middle third of esophagus [facility]) Staging form: Esophagus - Squamous Cell Carcinoma, AJCC 8th Edition - Clinical: Stage II (cT2, cN0, cM0, L: Middle) - Signed by Ladell Pier, MD on 03/23/2018 - Pathologic stage from 05/31/2018: Stage IIA (pT2, pN0, cM0, G2, L: Middle) - Signed by [name], MD on 05/31/2018   Patient returns to the office today in follow-up after transhiatal total esophagectomy for pathologic stage II a squamous cell carcinoma of the midesophagus.  Since last seen the patient has gained several pounds.  He is taking a p.o. diet reasonably well.  He comes in today with his blood pressure significantly elevated, during his hospitalization for his esophagectomy had significant problems with hypertension.  It is unclear from his discussion today if he is actually taking his blood pressure medication on a  regular basis.    Past Medical History:  Diagnosis Date  . Back pain   . Carpal tunnel syndrome   . Esophageal cancer ([place])   . GERD (gastroesophageal reflux disease)   . Hypertension   . Injury    tore ligaments left knee     Social History   Tobacco Use  Smoking Status Current Every Day Smoker  . Packs/day: 0.25  . Years: 7.00  . Pack years: 1.75  Smokeless Tobacco Never Used    Social History   Substance and Sexual Activity  Alcohol Use Yes  . Alcohol/week: 4.0 standard drinks  . Types: 4 Cans of beer per week     No Known Allergies  Current Outpatient Medications  Medication Sig Dispense Refill  . amLODipine (NORVASC) 10 MG tablet Take 1 tablet (10 mg total) by mouth daily. 30 tablet 1  . carvedilol (COREG) 12.5 MG tablet Take 1 tablet (12.5 mg total) by mouth 2 (two) times daily with a meal. 60 tablet 1  . cloNIDine (CATAPRES - DOSED IN MG/24 HR) 0.2 mg/24hr patch Place 1 patch (0.2 mg total) onto the skin once a week. 4 patch 10  . ibuprofen (ADVIL,MOTRIN) 800 MG tablet Take 1 tablet (800 mg total) by mouth 3 (three) times daily. (Patient taking differently: Take 800 mg by mouth every 8 (eight) hours as needed for mild pain. ) 21 tablet 0  . lisinopril (PRINIVIL,ZESTRIL) 20 MG tablet Take 1 tablet (20 mg total) by mouth daily. 30 tablet 1  . oxyCODONE (ROXICODONE) 5 MG immediate release tablet Take 1 tablet (5 mg total) by mouth every 6 (six) hours as  needed for severe pain. 30 tablet 0  . Water For Irrigation, Sterile (FREE WATER) SOLN Flush J tube (big red tube) two times daily (morning and at night) with 10 mg 1000 mL 2   No current facility-administered medications for this visit.        Physical Exam: There were no vitals taken for this visit.  {physical exam:21449}  Diagnostic Studies & Laboratory data:     Recent Radiology Findings:  No results found. I have independently reviewed the above radiology studies  and reviewed the findings with the  patient.     Recent Lab Findings: Lab Results  Component Value Date   WBC 4.4 07/04/2018   HGB 9.1 (L) 07/04/2018   HCT 28.4 (L) 07/04/2018   PLT 290 07/04/2018   GLUCOSE 104 (H) 07/04/2018   CHOL 94 06/11/2018   TRIG 55 06/11/2018   HDL 27 (L) 06/11/2018   LDLDIRECT 112 (H) 11/26/2009   LDLCALC 56 06/11/2018   ALT 16 06/22/2018   AST 16 06/22/2018   NA 132 (L) 07/04/2018   K 4.0 07/04/2018   CL 100 07/04/2018   CREATININE 0.74 07/04/2018   BUN 7 07/04/2018   CO2 26 07/04/2018   TSH 0.925 11/26/2009   INR 1.2 06/19/2018      Assessment / Plan:     [name] MD      [address] [place] [address] Office [phone]   Beeper [phone]  07/13/2018 12:22 PM

## 2019-08-15 NOTE — Progress Notes (Deleted)
[address]       [place] [address]             [phone]     Mccoy L Califano [place] Medical Record [id number] Date of Birth: [birth year]  Referring: No ref. provider found Primary Care: Nolene Ebbs, MD Primary Cardiologist: No primary care provider on file.  Chief Complaint:   POST OP FOLLOW UP  History of Present Illness:  05/29/2018 PREOPERATIVE DIAGNOSIS:  Squamous cell carcinoma of the esophagus. POSTOPERATIVE DIAGNOSIS:  Squamous cell carcinoma of the esophagus. SURGICAL PROCEDURE:  Bronchoscopy, transhiatal total esophagectomy with cervical esophagogastrostomy, pyloromyotomy, feeding jejunostomy tube, placement of left chest tube. SURGEON:  Lanelle Bal, MD     06/19/2018 PREOPERATIVE DIAGNOSIS:  Recurrent left pleural effusion, chylothorax. POSTOPERATIVE DIAGNOSIS:  Recurrent left pleural effusion, chylothorax. SURGICAL PROCEDURE:  Placement of left PleurX catheter with fluoroscopic and ultrasound guidance and drainage of left pleural effusion. SURGEON:  Lanelle Bal, MD  Cancer Staging Primary cancer of middle third of esophagus [facility]) Staging form: Esophagus - Squamous Cell Carcinoma, AJCC 8th Edition - Clinical: Stage II (cT2, cN0, cM0, L: Middle) - Signed by Ladell Pier, MD on 03/23/2018 - Pathologic stage from 05/31/2018: Stage IIA (pT2, pN0, cM0, G2, L: Middle) - Signed by [name], MD on 05/31/2018   Patient returns to the office today in follow-up after transhiatal total esophagectomy for pathologic stage II a squamous cell carcinoma of the midesophagus.    Past Medical History:  Diagnosis Date  . Back pain   . Carpal tunnel syndrome   . Esophageal cancer ([place])   . GERD (gastroesophageal reflux disease)   . Hypertension   . Injury    tore ligaments left knee     Social History   Tobacco Use  Smoking Status Current Every Day Smoker  . Packs/day: 0.25  . Years: 7.00  . Pack years: 1.75  Smokeless  Tobacco Never Used    Social History   Substance and Sexual Activity  Alcohol Use Yes  . Alcohol/week: 4.0 standard drinks  . Types: 4 Cans of beer per week     No Known Allergies  Current Outpatient Medications  Medication Sig Dispense Refill  . amLODipine (NORVASC) 10 MG tablet Take 1 tablet (10 mg total) by mouth daily. 30 tablet 1  . carvedilol (COREG) 12.5 MG tablet Take 1 tablet (12.5 mg total) by mouth 2 (two) times daily with a meal. 60 tablet 1  . cloNIDine (CATAPRES - DOSED IN MG/24 HR) 0.2 mg/24hr patch Place 1 patch (0.2 mg total) onto the skin once a week. 4 patch 10  . ibuprofen (ADVIL,MOTRIN) 800 MG tablet Take 1 tablet (800 mg total) by mouth 3 (three) times daily. (Patient taking differently: Take 800 mg by mouth every 8 (eight) hours as needed for mild pain. ) 21 tablet 0  . lisinopril (PRINIVIL,ZESTRIL) 20 MG tablet Take 1 tablet (20 mg total) by mouth daily. 30 tablet 1  . oxyCODONE (ROXICODONE) 5 MG immediate release tablet Take 1 tablet (5 mg total) by mouth every 6 (six) hours as needed for severe pain. 30 tablet 0  . Water For Irrigation, Sterile (FREE WATER) SOLN Flush J tube (big red tube) two times daily (morning and at night) with 10 mg 1000 mL 2   No current facility-administered medications for this visit.        Physical Exam: There were no vitals taken for this visit. {physical exam:21449}  Diagnostic Studies & Laboratory data:     Recent Radiology Findings:  No results found. I have independently reviewed the above radiology studies  and reviewed the findings with the patient.     Recent Lab Findings: Lab Results  Component Value Date   WBC 4.4 07/04/2018   HGB 9.1 (L) 07/04/2018   HCT 28.4 (L) 07/04/2018   PLT 290 07/04/2018   GLUCOSE 104 (H) 07/04/2018   CHOL 94 06/11/2018   TRIG 55 06/11/2018   HDL 27 (L) 06/11/2018   LDLDIRECT 112 (H) 11/26/2009   LDLCALC 56 06/11/2018   ALT 16 06/22/2018   AST 16 06/22/2018   NA 132 (L)  07/04/2018   K 4.0 07/04/2018   CL 100 07/04/2018   CREATININE 0.74 07/04/2018   BUN 7 07/04/2018   CO2 26 07/04/2018   TSH 0.925 11/26/2009   INR 1.2 06/19/2018      Assessment / Plan:    [name] MD      [address] [place] [address] Office [phone]   Beeper [phone]  07/13/2018 12:22 PM

## 2019-08-16 ENCOUNTER — Ambulatory Visit: Payer: Self-pay | Admitting: Cardiothoracic Surgery

## 2019-08-17 ENCOUNTER — Encounter: Payer: Self-pay | Admitting: Cardiothoracic Surgery

## 2021-02-08 IMAGING — DX DG CHEST 1V PORT
1 series · 1 of 1 positions shown · non-contrast
Comparison: Chest radiograph from earlier today.

CLINICAL DATA: Intubated

EXAM:
PORTABLE CHEST 1 VIEW

[chest ap]
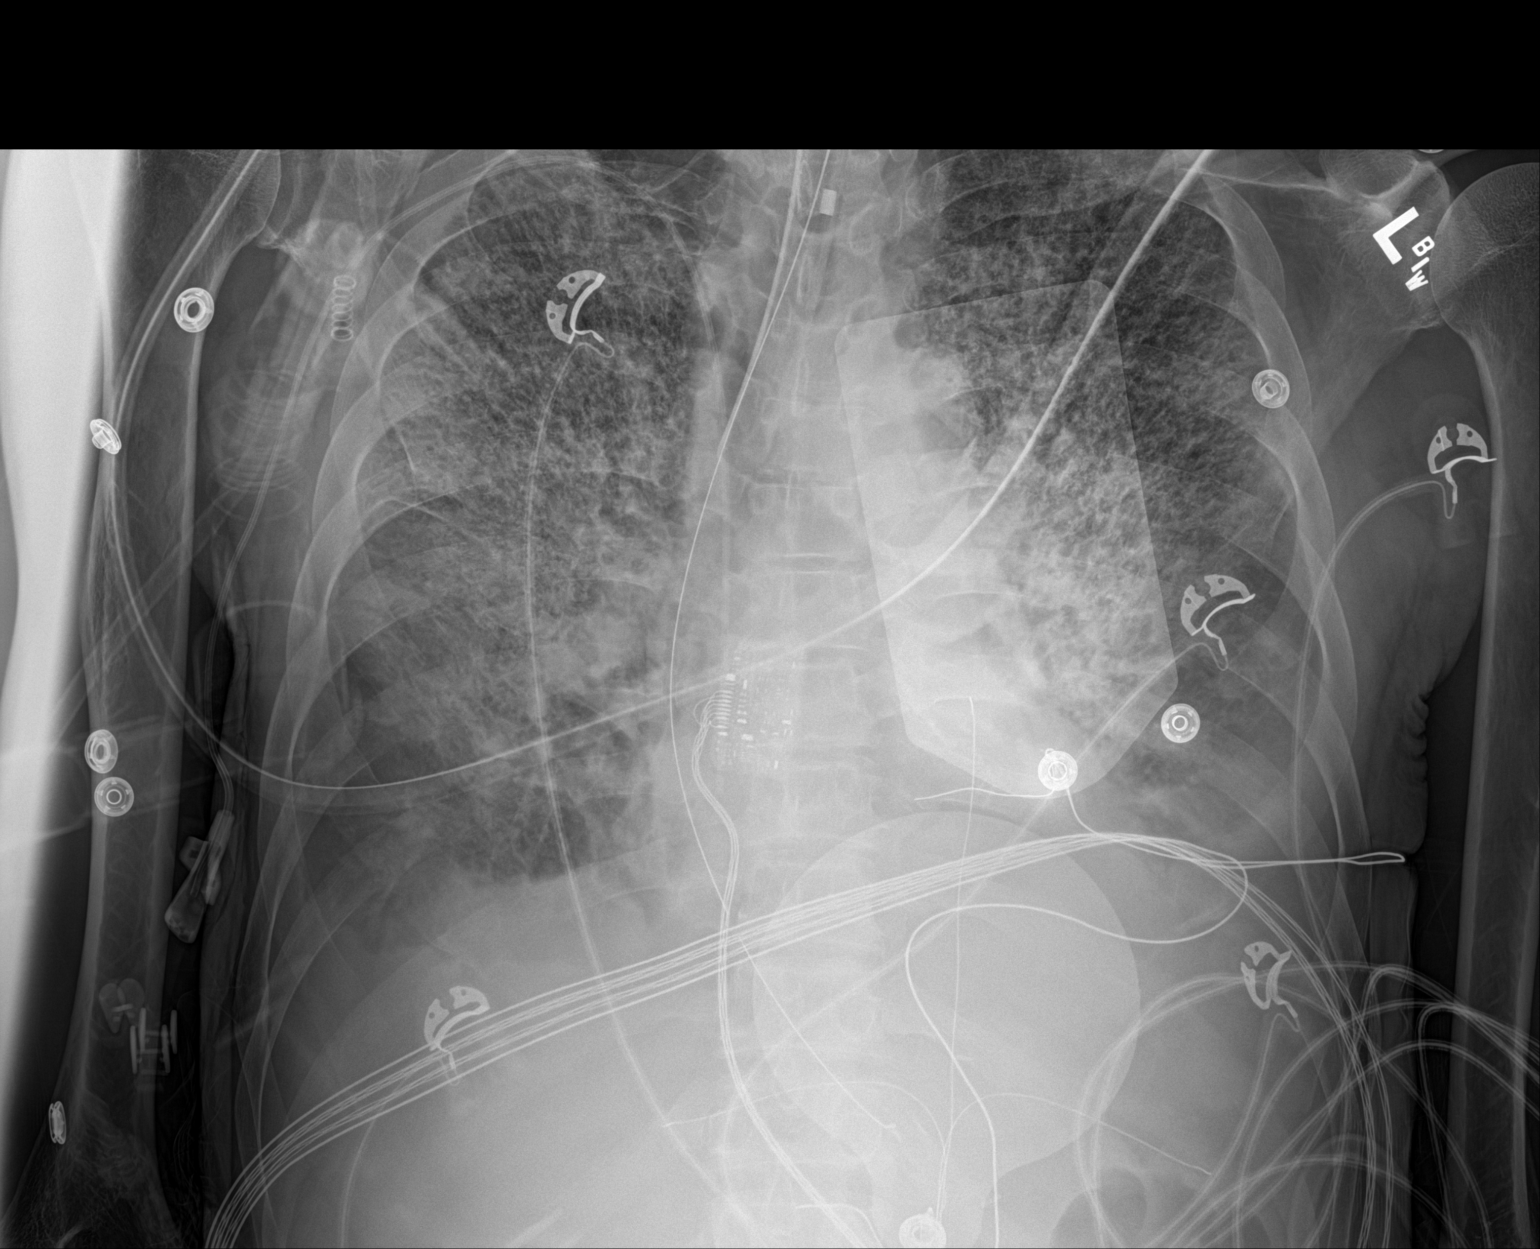

[1 of 1 positions shown; findings below may reference images not displayed]

FINDINGS: Endotracheal tube tip is 2.0 cm above the carina. Enteric tube
terminates in the proximal stomach with the side port at the
esophagogastric junction. Right subclavian central venous catheter
terminates over the right atrium. Pacer pads overlie the left chest.
Stable cardiomediastinal silhouette with normal heart size. No
pneumothorax. Small stable bilateral pleural effusions. Severe hazy
and linear opacities throughout the parahilar lungs, not appreciably
changed. Bibasilar atelectasis.
IMPRESSION: 1. Endotracheal tube tip 2.0 cm above the carina.
2. Enteric tube terminates in the proximal stomach with the side
port at the esophagogastric junction, consider advancing 5 cm.
3. Stable severe hazy and linear opacities throughout the parahilar
lungs, favor severe pulmonary edema.
4. Stable small bilateral pleural effusions and bibasilar
atelectasis.
